# Patient Record
Sex: Male | Born: 1945 | Race: White | Hispanic: No | Marital: Married | Smoking: Former smoker
Health system: Southern US, Community
[De-identification: ages and names within clinical notes are randomized; demographics above are authoritative.]

## PROBLEM LIST (undated history)

## (undated) DIAGNOSIS — Q2112 Patent foramen ovale: Secondary | ICD-10-CM

## (undated) DIAGNOSIS — R002 Palpitations: Secondary | ICD-10-CM

## (undated) DIAGNOSIS — E039 Hypothyroidism, unspecified: Secondary | ICD-10-CM

## (undated) DIAGNOSIS — J439 Emphysema, unspecified: Secondary | ICD-10-CM

## (undated) DIAGNOSIS — G4730 Sleep apnea, unspecified: Secondary | ICD-10-CM

## (undated) DIAGNOSIS — I77810 Thoracic aortic ectasia: Secondary | ICD-10-CM

## (undated) DIAGNOSIS — H269 Unspecified cataract: Secondary | ICD-10-CM

## (undated) DIAGNOSIS — J45909 Unspecified asthma, uncomplicated: Secondary | ICD-10-CM

## (undated) DIAGNOSIS — I482 Chronic atrial fibrillation, unspecified: Secondary | ICD-10-CM

## (undated) DIAGNOSIS — I428 Other cardiomyopathies: Secondary | ICD-10-CM

## (undated) DIAGNOSIS — F410 Panic disorder [episodic paroxysmal anxiety] without agoraphobia: Secondary | ICD-10-CM

## (undated) DIAGNOSIS — K859 Acute pancreatitis without necrosis or infection, unspecified: Secondary | ICD-10-CM

## (undated) DIAGNOSIS — E785 Hyperlipidemia, unspecified: Secondary | ICD-10-CM

## (undated) DIAGNOSIS — K219 Gastro-esophageal reflux disease without esophagitis: Secondary | ICD-10-CM

## (undated) DIAGNOSIS — Q211 Atrial septal defect: Secondary | ICD-10-CM

## (undated) DIAGNOSIS — I5022 Chronic systolic (congestive) heart failure: Secondary | ICD-10-CM

## (undated) HISTORY — DX: Hyperlipidemia, unspecified: E78.5

## (undated) HISTORY — DX: Other cardiomyopathies: I42.8

## (undated) HISTORY — PX: TEE WITHOUT CARDIOVERSION: SHX5443

## (undated) HISTORY — DX: Chronic atrial fibrillation, unspecified: I48.20

## (undated) HISTORY — DX: Patent foramen ovale: Q21.12

## (undated) HISTORY — DX: Unspecified asthma, uncomplicated: J45.909

## (undated) HISTORY — DX: Thoracic aortic ectasia: I77.810

## (undated) HISTORY — PX: CYSTOSCOPY W/ URETEROSCOPY: SUR379

## (undated) HISTORY — DX: Unspecified cataract: H26.9

## (undated) HISTORY — DX: Acute pancreatitis without necrosis or infection, unspecified: K85.90

## (undated) HISTORY — DX: Atrial septal defect: Q21.1

## (undated) HISTORY — DX: Chronic systolic (congestive) heart failure: I50.22

---

## 1978-11-19 DIAGNOSIS — K859 Acute pancreatitis without necrosis or infection, unspecified: Secondary | ICD-10-CM

## 1978-11-19 HISTORY — DX: Acute pancreatitis without necrosis or infection, unspecified: K85.90

## 2005-03-15 ENCOUNTER — Emergency Department: Payer: Self-pay | Admitting: Internal Medicine

## 2012-07-20 HISTORY — PX: CARDIAC CATHETERIZATION: SHX172

## 2012-07-25 ENCOUNTER — Ambulatory Visit: Payer: Self-pay

## 2012-08-04 ENCOUNTER — Telehealth: Payer: Self-pay

## 2012-08-04 NOTE — Telephone Encounter (Signed)
Pt calls to say he was referred to Dr. Welton Flakes for echo by Dr. Alycia Rossetti for c/o palpitations. He has been taking clonopin and zoloft for 7 years and was told these medications could be contributing to palpitations. Echo was ordered for this reason.   Echo was performed and results were called to pt today by someone at Dr. Milta Deiters office. Pt says he was told this was "grossly abnormal" and he needed to be seen tomm. Pt asked for further clarification but they refused to give him any info.  He then asked for echo report to be faxed to [place] and he says they stated, "If you switch your care to them you can never come back".   He is very anxious about these results and would ike one of our MDs to review. We received results of echo and Dr. Kirke Corin reviewed but we cannot give pt any results since we did not order. He says pt will need to be seen in office by [place] to get results. Pt called back. I explained this to him.  He is scheduled with Dr. Kirke Corin tomm at 1015. Understanding verb.

## 2012-08-05 ENCOUNTER — Encounter: Payer: Self-pay | Admitting: Cardiovascular Disease

## 2012-08-05 ENCOUNTER — Ambulatory Visit (INDEPENDENT_AMBULATORY_CARE_PROVIDER_SITE_OTHER): Payer: PRIVATE HEALTH INSURANCE | Admitting: Cardiovascular Disease

## 2012-08-05 VITALS — BP 118/62 | HR 121 | Ht 72.0 in | Wt 165.8 lb

## 2012-08-05 DIAGNOSIS — R002 Palpitations: Secondary | ICD-10-CM

## 2012-08-05 DIAGNOSIS — I4891 Unspecified atrial fibrillation: Secondary | ICD-10-CM | POA: Insufficient documentation

## 2012-08-05 DIAGNOSIS — Q211 Atrial septal defect: Secondary | ICD-10-CM

## 2012-08-05 DIAGNOSIS — I5022 Chronic systolic (congestive) heart failure: Secondary | ICD-10-CM

## 2012-08-05 HISTORY — DX: Chronic systolic (congestive) heart failure: I50.22

## 2012-08-05 MED ORDER — METOPROLOL TARTRATE 25 MG PO TABS: 25.0000 mg | ORAL_TABLET | Freq: Two times a day (BID) | ORAL | Status: DC

## 2012-08-05 NOTE — Progress Notes (Signed)
HPI  This is a 66 year old gentleman who was referred by Dr. Beverely Risen for evaluation of irregular heartbeats and abnormal echocardiogram. The patient was not aware of any previous cardiac history. He has known history of anxiety and panic disorder for many years which has been reasonably controlled with medications. He also has history of hypothyroidism on replacement therapy. He is not aware of any history of hypertension, or diabetes. He was recently noted to have irregular heartbeats. He underwent an echocardiogram which showed severely reduced LV systolic function with an ejection fraction of 25-30% with anterior wall hypokinesis. Both atria were noted to be of normal size. 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 The patient has noticed increased palpitations recently but thought it was due to his known anxiety. He is noted to be in atrial fibrillation with rapid ventricular response today. He is not aware of this diagnosis before. He does complain of dyspnea with minimal activities but no chest pain. He denies orthopnea, PND or lower extremity edema.   No Known Allergies   Current Outpatient Prescriptions on File Prior to Visit  Medication Sig Dispense Refill  . buPROPion (WELLBUTRIN SR) 100 MG 12 hr tablet Take 100 mg by mouth daily.      . clonazePAM (KLONOPIN) 0.5 MG tablet Take 0.25 mg by mouth every morning. Take 1/2 tablet twice daily.      [name] levothyroxine (SYNTHROID, LEVOTHROID) 75 MCG tablet Take 75 mcg by mouth daily.      . sertraline (ZOLOFT) 100 MG tablet Take 100 mg by mouth daily.      . simvastatin (ZOCOR) 20 MG tablet Take 10 mg by mouth at bedtime.      . metoprolol tartrate (LOPRESSOR) 25 MG tablet Take 1 tablet (25 mg total) by mouth 2 (two) times daily.  60 tablet  6     Past Medical History  Diagnosis Date  . Asthma   . Hyperlipidemia   . Pancreatitis 1980    due to ETOH     Past Surgical History    Procedure Date  . Cystoscopy w/ ureteroscopy      Family History  Problem Relation Age of Onset  . Arrhythmia Sister     A-fib/stroke   . Hyperlipidemia Brother      History   Social History  . Marital Status: Married    Spouse Name: N/A    Number of Children: N/A  . Years of Education: N/A   Occupational History  . Not on file.   Social History Main Topics  . Smoking status: Former Smoker -- 0.5 packs/day for 20 years    Types: Cigarettes    Quit date: 08/07/2008  . Smokeless tobacco: Not on file  . Alcohol Use: No  . Drug Use: No  . Sexually Active: Not on file   Other Topics Concern  . Not on file   Social History Narrative  . No narrative on file     ROS Constitutional: Negative for fever, chills, diaphoresis, activity change, appetite change and fatigue.  HENT: Negative for hearing loss, nosebleeds, congestion, sore throat, facial swelling, drooling, trouble swallowing, neck pain, voice change, sinus pressure and tinnitus.  Eyes: Negative for photophobia, pain, discharge and visual disturbance.  Respiratory: Negative for apnea, cough, chest tightness and wheezing.  Cardiovascular: Negative for chest pain  and leg swelling.  Gastrointestinal: Negative for nausea, vomiting, abdominal pain, diarrhea, constipation, blood in stool  and abdominal distention.  Genitourinary: Negative for dysuria, urgency, frequency, hematuria and decreased urine volume.  Musculoskeletal: Negative for myalgias, back pain, joint swelling, arthralgias and gait problem.  Skin: Negative for color change, pallor, rash and wound.  Neurological: Negative for dizziness, tremors, seizures, syncope, speech difficulty, weakness, light-headedness, numbness and headaches.  Psychiatric/Behavioral: Negative for suicidal ideas, hallucinations, behavioral problems and agitation. The patient is  nervous/anxious.     PHYSICAL EXAM   BP 118/62  Pulse 121  Ht 6' (1.829 m)  Wt 165 lb 12 oz  (75.184 kg)  BMI 22.48 kg/m2  Constitutional: He is oriented to person, place, and time. He appears well-developed and well-nourished. No distress.  HENT: No nasal discharge.  Head: Normocephalic and atraumatic.  Eyes: Pupils are equal and round. Right eye exhibits no discharge. Left eye exhibits no discharge.  Neck: Normal range of motion. Neck supple. No JVD present. No thyromegaly present.  Cardiovascular: Tachycardic, irregular rhythm, normal heart sounds and. Exam reveals no gallop and no friction rub. No murmur heard.  Pulmonary/Chest: Effort normal and breath sounds normal. No stridor. No respiratory distress. He has no wheezes. He has no rales. He exhibits no tenderness.  Abdominal: Soft. Bowel sounds are normal. He exhibits no distension. There is no tenderness. There is no rebound and no guarding.  Musculoskeletal: Normal range of motion. He exhibits no edema and no tenderness.  Neurological: He is alert and oriented to person, place, and time. Coordination normal.  Skin: Skin is warm and dry. No rash noted. He is not diaphoretic. No erythema. No pallor.  Psychiatric: He has a normal mood and affect. His behavior is normal. Judgment and thought content normal.      EKG: Atrial fibrillation  -irregular conduction  -RSR(V1) -nondiagnostic.   Possible left ventricular hypertrophy on non-voltage basis.   -Nonspecific ST depression   +   Negative T-waves  -Seen with left ventricular hypertrophy (strain) or digitalis effect  consider  Anterolateral ischemia.    ASSESSMENT AND PLAN

## 2012-08-05 NOTE — Patient Instructions (Addendum)
Start Metoprolol 25 mg twice daily.   Your physician has requested that you have a TEE. During a TEE, sound waves are used to create images of your heart. It provides your doctor with information about the size and shape of your heart and how well your heart's chambers and valves are working. In this test, a transducer is attached to the end of a flexible tube that's guided down your throat and into your esophagus (the tube leading from you mouth to your stomach) to get a more detailed image of your heart. You are not awake for the procedure. Please see the instruction sheet given to you today. For further information please visit [email or web address]   Your physician has requested that you have a cardiac catheterization. Cardiac catheterization is used to diagnose and/or treat various heart conditions. Doctors may recommend this procedure for a number of different reasons. The most common reason is to evaluate chest pain. Chest pain can be a symptom of coronary artery disease (CAD), and cardiac catheterization can show whether plaque is narrowing or blocking your heart's arteries. This procedure is also used to evaluate the valves, as well as measure the blood flow and oxygen levels in different parts of your heart. For further information please visit [email or web address] Please follow instruction sheet, as given.

## 2012-08-05 NOTE — Assessment & Plan Note (Signed)
The patient is currently in atrial fibrillation with rapid ventricular response. The onset of this is not entirely clear that he has been complaining of palpitations over the last few months. Given his degree of LV systolic dysfunction, he will likely need anticoagulation. However, I will wait until after cardiac catheterization. For now, continue treatment with aspirin daily and add metoprolol 25 mg twice daily for rate control.

## 2012-08-05 NOTE — Assessment & Plan Note (Signed)
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 I recommend a transesophageal echocardiogram for further evaluation of this. This will be done before his cardiac catheterization.

## 2012-08-05 NOTE — Assessment & Plan Note (Signed)
Jeffery Shaw was found recently to have severely reduced LV systolic function with an ejection fraction of 25-30% with significant anterior wall hypokinesis suggestive of underlying coronary artery disease. The etiology of cardiomyopathy is still not clear but most likely ischemic in nature. The other possibility could be tachycardia induced cardiomyopathy or nonischemic. Given his significant tachycardia, I recommend treatment with metoprolol 25 mg twice daily. I recommend proceeding with a right and left cardiac catheterization and possible coronary intervention. Risks, benefits and alternatives were discussed with the patient.

## 2012-08-06 ENCOUNTER — Telehealth: Payer: Self-pay

## 2012-08-06 LAB — BASIC METABOLIC PANEL
BUN/Creatinine Ratio: 15 (ref 10–22)
BUN: 17 mg/dL (ref 8–27)
Creatinine, Ser: 1.1 mg/dL (ref 0.76–1.27)
GFR calc Af Amer: 82 mL/min/{1.73_m2} (ref >59)
GFR calc non Af Amer: 71 mL/min/{1.73_m2} (ref >59)

## 2012-08-06 LAB — CBC WITH DIFFERENTIAL
Basos: 1 % (ref 0–3)
Eos: 6 % — ABNORMAL HIGH (ref 0–5)
Hemoglobin: 16.7 g/dL (ref 12.6–17.7)
Immature Grans (Abs): 0 10*3/uL (ref 0.0–0.1)
Lymphocytes Absolute: 1.9 10*3/uL (ref 0.7–3.1)
MCH: 34.5 pg — ABNORMAL HIGH (ref 26.6–33.0)
MCV: 99 fL — ABNORMAL HIGH (ref 79–97)
Monocytes Absolute: 0.7 10*3/uL (ref 0.1–0.9)
RBC: 4.84 x10E6/uL (ref 4.14–5.80)

## 2012-08-06 NOTE — Telephone Encounter (Signed)
Pt calling to say he went to see Dr. Welton Flakes as scheduled after appt with [place] yesterday. He says Dr. Welton Flakes agrees with Dr. Jari Sportsman plan and pt is going to procede.   He did have a question...Dr. Welton Flakes has scheduled pt for a sleep study tomm night at 2100, night before TEE. He asks if he should keep this as scheduled or cancel until after our procedures. He also wanted to let us know he has a chronic fungal infection (candidiasis) and wants Dr. Kirke Corin to be aware of this. I reassured pt I would inform him of this as well as sleep study and call him back. Understanding verb.

## 2012-08-06 NOTE — Telephone Encounter (Signed)
Sleep study is fine as long as it does not interfere with our schedule.

## 2012-08-07 NOTE — Telephone Encounter (Signed)
Attempted to reach pt to inform Home # disconnected Attempted wife's cell #. Had to [facility]

## 2012-08-07 NOTE — Telephone Encounter (Signed)
Pt called back. Informed him ok to proceed with sleep study as long as it does not interfere with TEE. He verb. Understanding and will be finished with sleep study by 0600.

## 2012-08-08 ENCOUNTER — Ambulatory Visit: Payer: Self-pay | Admitting: Cardiovascular Disease

## 2012-08-08 DIAGNOSIS — Q211 Atrial septal defect: Secondary | ICD-10-CM

## 2012-08-12 ENCOUNTER — Ambulatory Visit: Payer: Self-pay | Admitting: Cardiovascular Disease

## 2012-08-12 ENCOUNTER — Encounter: Payer: Self-pay | Admitting: Cardiovascular Disease

## 2012-08-12 DIAGNOSIS — R0602 Shortness of breath: Secondary | ICD-10-CM

## 2012-08-21 ENCOUNTER — Other Ambulatory Visit: Payer: Self-pay | Admitting: Cardiovascular Disease

## 2012-08-21 DIAGNOSIS — R002 Palpitations: Secondary | ICD-10-CM

## 2012-08-22 ENCOUNTER — Encounter: Payer: Self-pay | Admitting: *Deleted

## 2012-08-22 ENCOUNTER — Ambulatory Visit (INDEPENDENT_AMBULATORY_CARE_PROVIDER_SITE_OTHER): Payer: PRIVATE HEALTH INSURANCE | Admitting: Cardiovascular Disease

## 2012-08-22 VITALS — BP 108/78 | HR 80 | Ht 72.0 in | Wt 161.8 lb

## 2012-08-22 DIAGNOSIS — I5022 Chronic systolic (congestive) heart failure: Secondary | ICD-10-CM

## 2012-08-22 DIAGNOSIS — Q211 Atrial septal defect: Secondary | ICD-10-CM

## 2012-08-22 DIAGNOSIS — I4891 Unspecified atrial fibrillation: Secondary | ICD-10-CM

## 2012-08-22 MED ORDER — RIVAROXABAN 20 MG PO TABS: 20.0000 mg | ORAL_TABLET | Freq: Every day | ORAL | Status: DC

## 2012-08-22 NOTE — Patient Instructions (Addendum)
Stop Aspirin.  Start Xarelto 20 mg once daily (blood thinner).  Schedule cardioversion in 3-4 weeks.  Continue other medications.

## 2012-08-22 NOTE — Assessment & Plan Note (Signed)
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 Cardiac catheterization showed no significant coronary artery disease. Ejection fraction was 35%. He appears to be euvolemic at this time. Continue treatment with metoprolol. I recommend trying to maintain sinus rhythm and reevaluate his ejection fraction. He will likely require treatment with an ACE inhibitor as well.

## 2012-08-22 NOTE — Assessment & Plan Note (Signed)
Due to his cardiomyopathy, I recommend an attempt at cardioversion after 3-4 weeks of full anticoagulation. I started him on Xarelto 20 mg once daily. If he fails cardioversion, the next step would be considering an antiarrhythmic medication such as amiodarone or catheter ablation.

## 2012-08-22 NOTE — Assessment & Plan Note (Signed)
The shunt ratio was a relatively small by cardiac catheterization. Thus, no further treatment is needed for this.

## 2012-08-22 NOTE — Progress Notes (Signed)
HPI  This is a 65 year old gentleman who is here today for a followup visit regarding recently diagnosed congestive heart failure, atrial fibrillation and a PFO. He was recently noted to have irregular heartbeats. He underwent an echocardiogram which showed severely reduced LV systolic function with an ejection fraction of 25-30% with anterior wall hypokinesis. Both atria were noted to be of normal size. 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  He was noted to be in atrial fibrillation with rapid ventricular response during his initial visit with me. I started him on metoprolol 25 mg twice daily. He underwent a transesophageal echocardiogram which showed an ejection fraction of 30-35% with normal atrial size. There was a tunneled PFO with a small to moderate left atrial shunting by color Doppler. He underwent a right and left cardiac catheterization which showed no significant coronary artery disease with a QP/QS shunt ratio of 1.37.  Overall, he feels well with only mild dyspnea. He was also recently diagnosed with sleep apnea which is being managed by Dr. Freda Munro.   No Known Allergies   Current Outpatient Prescriptions on File Prior to Visit  Medication Sig Dispense Refill  . buPROPion (WELLBUTRIN SR) 100 MG 12 hr tablet Take 100 mg by mouth daily.      . clonazePAM (KLONOPIN) 0.5 MG tablet Take 0.25 mg by mouth every morning. Take 1/2 tablet twice daily.      [name] levothyroxine (SYNTHROID, LEVOTHROID) 75 MCG tablet Take 75 mcg by mouth daily.      . metoprolol tartrate (LOPRESSOR) 25 MG tablet Take 1 tablet (25 mg total) by mouth 2 (two) times daily.  60 tablet  6  . Plant Sterols and Stanols (CHOLEST OFF PO) Take by mouth daily.      . sertraline (ZOLOFT) 100 MG tablet Take 100 mg by mouth daily.      . simvastatin (ZOCOR) 20 MG tablet Take 10 mg by mouth at bedtime.      [name] VITAMIN D, ERGOCALCIFEROL, PO Take 400 Units by mouth  daily.      . Rivaroxaban (XARELTO) 20 MG TABS Take 1 tablet (20 mg total) by mouth daily.  30 tablet  6     Past Medical History  Diagnosis Date  . Asthma   . Hyperlipidemia   . Pancreatitis 1980    due to ETOH  . Chronic systolic heart failure 08/05/2012  . 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 Chronic systolic heart failure     Due to nonischemic cardiomyopathy. Cardiac catheterization in September of 2013 showed no significant coronary artery disease with an ejection fraction of 35%. Normal filling pressures with only mild pulmonary hypertension.     Past Surgical History  Procedure Date  . Cystoscopy w/ ureteroscopy   . Cardiac catheterization 07/2012    [facility]  . Tee without cardioversion      Family History  Problem Relation Age of Onset  . Arrhythmia Sister     A-fib/stroke   . Hyperlipidemia Brother      History   Social History  . Marital Status: Married    Spouse Name: N/A    Number of Children: N/A  . Years of Education: N/A   Occupational History  . Not on file.   Social History Main Topics  .  Smoking status: Former Smoker -- 0.5 packs/day for 20 years    Types: Cigarettes    Quit date: 08/07/2008  . Smokeless tobacco: Not on file  . Alcohol Use: No  . Drug Use: No  . Sexually Active: Not on file   Other Topics Concern  . Not on file   Social History Narrative  . No narrative on file     PHYSICAL EXAM   BP 108/78  Pulse 80  Ht 6' (1.829 m)  Wt 161 lb 12 oz (73.369 kg)  BMI 21.94 kg/m2  Constitutional: He is oriented to person, place, and time. He appears well-developed and well-nourished. No distress.  HENT: No nasal discharge.  Head: Normocephalic and atraumatic.  Eyes: Pupils are equal and round. Right eye exhibits no discharge. Left eye exhibits no discharge.  Neck: Normal range of motion. Neck supple. No JVD present. No  thyromegaly present.  Cardiovascular:  normal rate , irregular rhythm, normal heart sounds and. Exam reveals no gallop and no friction rub. No murmur heard.  Pulmonary/Chest: Effort normal and breath sounds normal. No stridor. No respiratory distress. He has no wheezes. He has no rales. He exhibits no tenderness.  Abdominal: Soft. Bowel sounds are normal. He exhibits no distension. There is no tenderness. There is no rebound and no guarding.  Musculoskeletal: Normal range of motion. He exhibits no edema and no tenderness.  Neurological: He is alert and oriented to person, place, and time. Coordination normal.  Skin: Skin is warm and dry. No rash noted. He is not diaphoretic. No erythema. No pallor.  Psychiatric: He has a normal mood and affect. His behavior is normal. Judgment and thought content normal.   right groin: A small ecchymosis with no hematoma or bruit.     EKG: Atrial fibrillation   -RSR(V1) -nondiagnostic.   -Nonspecific ST depression   +   Negative T-waves  -Nondiagnostic  -Possible  Anterolateral  ischemia.   ABNORMAL     ASSESSMENT AND PLAN

## 2012-08-27 ENCOUNTER — Telehealth: Payer: Self-pay

## 2012-08-27 ENCOUNTER — Other Ambulatory Visit: Payer: Self-pay

## 2012-08-27 DIAGNOSIS — I4891 Unspecified atrial fibrillation: Secondary | ICD-10-CM

## 2012-08-27 NOTE — Telephone Encounter (Signed)
Schedule cardioversion

## 2012-08-27 NOTE — Telephone Encounter (Signed)
Pt wants cardioversion to be scheduled for 09/15/12 I told him I would call to arrange and call him back with details. Understanding verb

## 2012-08-27 NOTE — Telephone Encounter (Signed)
LMTCB Also mailing instructions re: cardioversion to pt

## 2012-09-01 ENCOUNTER — Telehealth: Payer: Self-pay

## 2012-09-01 NOTE — Telephone Encounter (Signed)
Pt calling to get specifics regarding upcoming cardioversion. Verbal instructions given to pt. Also scheduled him for an appt for labs/EKG 09/12/12 at 0930. He also asks about cost of procedure. i advised him to contact billing dept at [facility] to find out. Understanding verb.

## 2012-09-10 ENCOUNTER — Other Ambulatory Visit: Payer: Self-pay | Admitting: *Deleted

## 2012-09-10 DIAGNOSIS — Z01818 Encounter for other preprocedural examination: Secondary | ICD-10-CM

## 2012-09-10 DIAGNOSIS — Z01810 Encounter for preprocedural cardiovascular examination: Secondary | ICD-10-CM

## 2012-09-12 ENCOUNTER — Ambulatory Visit (INDEPENDENT_AMBULATORY_CARE_PROVIDER_SITE_OTHER): Payer: PRIVATE HEALTH INSURANCE

## 2012-09-12 ENCOUNTER — Other Ambulatory Visit: Payer: Self-pay | Admitting: Cardiovascular Disease

## 2012-09-12 ENCOUNTER — Ambulatory Visit (INDEPENDENT_AMBULATORY_CARE_PROVIDER_SITE_OTHER): Payer: PRIVATE HEALTH INSURANCE | Admitting: Cardiovascular Disease

## 2012-09-12 ENCOUNTER — Encounter: Payer: Self-pay | Admitting: Cardiovascular Disease

## 2012-09-12 ENCOUNTER — Other Ambulatory Visit: Payer: PRIVATE HEALTH INSURANCE

## 2012-09-12 VITALS — BP 124/72 | HR 80 | Ht 72.0 in | Wt 162.0 lb

## 2012-09-12 DIAGNOSIS — I4891 Unspecified atrial fibrillation: Secondary | ICD-10-CM

## 2012-09-12 DIAGNOSIS — R002 Palpitations: Secondary | ICD-10-CM

## 2012-09-12 DIAGNOSIS — Z01818 Encounter for other preprocedural examination: Secondary | ICD-10-CM

## 2012-09-12 DIAGNOSIS — Z01810 Encounter for preprocedural cardiovascular examination: Secondary | ICD-10-CM

## 2012-09-12 DIAGNOSIS — R0989 Other specified symptoms and signs involving the circulatory and respiratory systems: Secondary | ICD-10-CM

## 2012-09-12 NOTE — Patient Instructions (Addendum)
Patient will have a cardioversion on Monday 09/15/2012.

## 2012-09-15 ENCOUNTER — Ambulatory Visit: Payer: Self-pay | Admitting: Cardiovascular Disease

## 2012-09-15 DIAGNOSIS — I4891 Unspecified atrial fibrillation: Secondary | ICD-10-CM

## 2012-09-15 HISTORY — PX: CARDIOVERSION: SHX1299

## 2012-09-16 ENCOUNTER — Telehealth: Payer: Self-pay | Admitting: Cardiovascular Disease

## 2012-09-16 NOTE — Telephone Encounter (Signed)
LMTCB

## 2012-09-16 NOTE — Telephone Encounter (Signed)
Pt calling states that he had his cardioversion yesterday and he says he never felt fluttering before and last night he did. Wants to know what he should do.

## 2012-09-17 ENCOUNTER — Ambulatory Visit (INDEPENDENT_AMBULATORY_CARE_PROVIDER_SITE_OTHER): Payer: PRIVATE HEALTH INSURANCE

## 2012-09-17 VITALS — BP 98/60 | HR 100 | Ht 72.0 in | Wt 164.5 lb

## 2012-09-17 DIAGNOSIS — R002 Palpitations: Secondary | ICD-10-CM

## 2012-09-17 NOTE — Telephone Encounter (Signed)
Late entry: Pt called back at 0900 this am Says he has been feeling more palpitations than usual. Had cardioversion for atrial fib Monday 09/15/12 Was told he converted to NSR This am he is concerned b/'c he has "never felt this many skips" Otherwise asymptomatic Advised to come in for EKG this am at 1000

## 2012-09-17 NOTE — Progress Notes (Signed)
Pt here for EKG C/o frequent palpitations since cardioversion 2 days ago EKG performed Denies sob, CP, or dizziness  EKG shows questionable atrial fib versus NSR with PAC's Dr. Kirke Corin at [facility] today No MD in office at this time  I have paged Dr. Kirke Corin   Dr. Kirke Corin scrubbed in at [facility] lab I spoke with nurse in lab who explained situation to Dr. Kirke Corin "Have pt take extra metoprolol today only"  V.O Dr. Adline Mango, RN  Pt informed. Understanding verb. Has f/u appt with Dr. Kirke Corin 09/23/12  I called pt on cell phone after he left and advised him to monitor BP closely today. I explained it was a little low in office but he was asymptomatic. I warned him that the extra metoprolol dose may cause his BP to drop slightly but advised him to call us should he become symptomatic/SBP<90. I also advised him to check BP tonight, prior to next metoprolol dose, and may only need to take 1/2 metoprolol if BP still low. Understanding verb.

## 2012-09-17 NOTE — Patient Instructions (Addendum)
Take an extra dose of metoprolol today only Keep appt with Dr. Kirke Corin next week as scheduled Call our office should symptoms worsen/continue despite med changes Monitor BP

## 2012-09-17 NOTE — Progress Notes (Signed)
   Patient ID: Jeffery Shaw, male    DOB: December 07, 1945, 66 y.o.   MRN: [id number]  HPI Comments: Patient will have a cardioversion on Monday 09/15/2012.     Review of Systems    Physical Exam

## 2012-09-18 ENCOUNTER — Telehealth: Payer: Self-pay

## 2012-09-18 NOTE — Telephone Encounter (Signed)
Dr. Kirke Corin reviewed EKG Agrees this is NSR with PAC's and gave order: "Have patient increase metoprolol to 50 mg BID and keep f/u appt as scheduled". V.O Dr. Adline Mango, RN  Pt informed. He says he feels well today and thinks the estra dose yesterday helped him feel better. He wants to try increasing to 1 1/2 tabs BID first then may increase to 2 tabs BID. He will keep appt with [place] Tuesday 09/23/12

## 2012-09-23 ENCOUNTER — Ambulatory Visit (INDEPENDENT_AMBULATORY_CARE_PROVIDER_SITE_OTHER): Payer: PRIVATE HEALTH INSURANCE | Admitting: Cardiovascular Disease

## 2012-09-23 ENCOUNTER — Encounter: Payer: Self-pay | Admitting: Cardiovascular Disease

## 2012-09-23 VITALS — BP 102/66 | HR 74 | Ht 72.0 in | Wt 166.5 lb

## 2012-09-23 DIAGNOSIS — I4891 Unspecified atrial fibrillation: Secondary | ICD-10-CM

## 2012-09-23 DIAGNOSIS — I5022 Chronic systolic (congestive) heart failure: Secondary | ICD-10-CM

## 2012-09-23 MED ORDER — LISINOPRIL 2.5 MG PO TABS: 2.5000 mg | ORAL_TABLET | Freq: Every day | ORAL | Status: DC

## 2012-09-23 NOTE — Progress Notes (Signed)
HPI  This is a 66 year old gentleman who is here today for a followup visit regarding congestive heart failure, atrial fibrillation and a PFO. Echocardiogram  showed severely reduced LV systolic function with an ejection fraction of 25-30% with anterior wall hypokinesis. Both atria were noted to be of normal size. 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  He was noted to be in atrial fibrillation with rapid ventricular response during his initial visit with me. I started him on metoprolol . He underwent a transesophageal echocardiogram which showed an ejection fraction of 30-35% with normal atrial size. There was a tunneled PFO with a small to moderate left atrial shunting by color Doppler. He underwent a right and left cardiac catheterization which showed no significant coronary artery disease with a QP/QS shunt ratio of 1.37.  He recently underwent cardioversion. He converted to normal sinus rhythm but was noted to have frequent PACs. After the procedure, he complained of increased palpitations. An ECG was done which showed sinus rhythm with frequent PACs. Today, his EKG showed that he is back in atrial fibrillation. Overall, he feels well. He denies chest pain,  dyspnea or palpitations.  No Known Allergies   Current Outpatient Prescriptions on File Prior to Visit  Medication Sig Dispense Refill  . buPROPion (WELLBUTRIN SR) 100 MG 12 hr tablet Take 100 mg by mouth daily.      . clonazePAM (KLONOPIN) 0.5 MG tablet Take 0.25 mg by mouth every morning. Take 1/2 tablet twice daily.      [name] levothyroxine (SYNTHROID, LEVOTHROID) 75 MCG tablet Take 75 mcg by mouth daily.      . Plant Sterols and Stanols (CHOLEST OFF PO) Take by mouth daily.      . Rivaroxaban (XARELTO) 20 MG TABS Take 1 tablet (20 mg total) by mouth daily.  30 tablet  6  . sertraline (ZOLOFT) 100 MG tablet Take 100 mg by mouth daily.      . simvastatin (ZOCOR) 20 MG tablet  Take 10 mg by mouth at bedtime.      [name] VITAMIN D, ERGOCALCIFEROL, PO Take 400 Units by mouth daily.      . [DISCONTINUED] metoprolol tartrate (LOPRESSOR) 25 MG tablet Take 1 tablet (25 mg total) by mouth 2 (two) times daily.  60 tablet  6  . lisinopril (PRINIVIL,ZESTRIL) 2.5 MG tablet Take 1 tablet (2.5 mg total) by mouth daily.  60 tablet  6     Past Medical History  Diagnosis Date  . Asthma   . Hyperlipidemia   . Pancreatitis 1980    due to ETOH  . Chronic systolic heart failure 08/05/2012  . 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 Chronic systolic heart failure     Due to nonischemic cardiomyopathy. Cardiac catheterization in September of 2013 showed no significant coronary artery disease with an ejection fraction of 35%. Normal filling pressures with only mild pulmonary hypertension.     Past Surgical History  Procedure Date  . Cystoscopy w/ ureteroscopy   . Cardiac catheterization 07/2012    [facility]  . Tee without cardioversion   . Cardioversion 09/15/12     Family History  Problem Relation Age of Onset  . Arrhythmia Sister     A-fib/stroke   . Hyperlipidemia Brother      History  Social History  . Marital Status: Married    Spouse Name: N/A    Number of Children: N/A  . Years of Education: N/A   Occupational History  . Not on file.   Social History Main Topics  . Smoking status: Former Smoker -- 0.5 packs/day for 20 years    Types: Cigarettes    Quit date: 08/07/2008  . Smokeless tobacco: Not on file  . Alcohol Use: No  . Drug Use: No  . Sexually Active: Not on file   Other Topics Concern  . Not on file   Social History Narrative  . No narrative on file     PHYSICAL EXAM   BP 102/66  Pulse 74  Ht 6' (1.829 m)  Wt 166 lb 8 oz (75.524 kg)  BMI 22.58 kg/m2  Constitutional: He is oriented to person, place, and time. He appears well-developed  and well-nourished. No distress.  HENT: No nasal discharge.  Head: Normocephalic and atraumatic.  Eyes: Pupils are equal and round. Right eye exhibits no discharge. Left eye exhibits no discharge.  Neck: Normal range of motion. Neck supple. No JVD present. No thyromegaly present.  Cardiovascular:  normal rate , irregular rhythm, normal heart sounds and. Exam reveals no gallop and no friction rub. No murmur heard.  Pulmonary/Chest: Effort normal and breath sounds normal. No stridor. No respiratory distress. He has no wheezes. He has no rales. He exhibits no tenderness.  Abdominal: Soft. Bowel sounds are normal. He exhibits no distension. There is no tenderness. There is no rebound and no guarding.  Musculoskeletal: Normal range of motion. He exhibits no edema and no tenderness.  Neurological: He is alert and oriented to person, place, and time. Coordination normal.  Skin: Skin is warm and dry. No rash noted. He is not diaphoretic. No erythema. No pallor.  Psychiatric: He has a normal mood and affect. His behavior is normal. Judgment and thought content normal.   right groin: A small ecchymosis with no hematoma or bruit.     EKG: Atrial fibrillation  -  -Nonspecific ST depression   +   Negative T-waves  -Seen with left ventricular hypertrophy (strain) or digitalis effect.   ABNORMAL    ASSESSMENT AND PLAN

## 2012-09-23 NOTE — Assessment & Plan Note (Signed)
The patient is back in atrial fibrillation after recent cardioversion. He does not seem to be symptomatic at all. I discussed different management options moving forward. These include placing him on antiarrhythmic medication either amiodarone or dofetilide then attempting cardioversion again. The other option is to continue medical therapy for now given that he does not seem to be particularly symptomatic. We can continue with rate control and reevaluate his ejection fraction in 2 months. If there is no significant improvement, we might need to consider attempting cardioversion again after loading him with an antiarrhythmic medication. Continue anticoagulation. He is still working on getting his CPAP adjusted to treat his sleep apnea.

## 2012-09-23 NOTE — Patient Instructions (Addendum)
Start Lisinopril 2.5 mg once daily.  Continue other medications.  Schedule echocardiogram in 2 months.  Follow up after Echo.

## 2012-09-23 NOTE — Assessment & Plan Note (Signed)
I will add very small dose lisinopril today. Continue current dose of metoprolol. He does not appear to be fluid overloaded.

## 2012-10-06 ENCOUNTER — Telehealth: Payer: Self-pay

## 2012-10-06 NOTE — Telephone Encounter (Signed)
Pt calling to say he has noticed some feelings of "lightheadedness" while working in yard over the past 1 week He wonders if this is assoc. With lisinopril start I had him hold while I discussed with Dr. Kirke Corin  "Have pt monitor BP over the next few days and call us with results". VO Dr. Adline Mango, RN Pt informed Understanding verb

## 2012-10-22 ENCOUNTER — Other Ambulatory Visit: Payer: Self-pay

## 2012-10-22 MED ORDER — METOPROLOL TARTRATE 50 MG PO TABS: 50.0000 mg | ORAL_TABLET | Freq: Two times a day (BID) | ORAL | Status: DC

## 2012-11-14 ENCOUNTER — Telehealth: Payer: Self-pay | Admitting: Cardiovascular Disease

## 2012-11-14 NOTE — Telephone Encounter (Signed)
Pt states he is in Metoprolol and Lisinopril and is having a dry cough or "tickling of throat".  Pt would like to discuss his options.

## 2012-11-14 NOTE — Telephone Encounter (Signed)
Do you want me to advise pt to hold lisinopril for a few days?

## 2012-11-15 NOTE — Telephone Encounter (Signed)
It is likely a side effect of Lisinopril. Stop Lisinopril. Start Losartan 25 mg (1/2 tablet once daily)

## 2012-11-17 ENCOUNTER — Other Ambulatory Visit: Payer: Self-pay

## 2012-11-17 MED ORDER — LOSARTAN POTASSIUM 25 MG PO TABS: 12.5000 mg | ORAL_TABLET | Freq: Every day | ORAL | Status: DC

## 2012-11-17 NOTE — Telephone Encounter (Signed)
Pt informed Understanding verb New RX sent to pharm  

## 2012-11-25 ENCOUNTER — Other Ambulatory Visit: Payer: Self-pay

## 2012-11-25 ENCOUNTER — Other Ambulatory Visit (INDEPENDENT_AMBULATORY_CARE_PROVIDER_SITE_OTHER): Payer: PRIVATE HEALTH INSURANCE

## 2012-11-25 DIAGNOSIS — Q211 Atrial septal defect: Secondary | ICD-10-CM

## 2012-11-25 DIAGNOSIS — I4891 Unspecified atrial fibrillation: Secondary | ICD-10-CM

## 2012-11-26 ENCOUNTER — Telehealth: Payer: Self-pay

## 2012-11-26 NOTE — Telephone Encounter (Signed)
Pt calling for echo results I explained Dr. Kirke Corin to review and we will call him with results Understanding verb

## 2012-11-26 NOTE — Telephone Encounter (Signed)
Pt not at home. 

## 2012-11-27 ENCOUNTER — Encounter: Payer: Self-pay | Admitting: Cardiovascular Disease

## 2012-12-01 ENCOUNTER — Ambulatory Visit (INDEPENDENT_AMBULATORY_CARE_PROVIDER_SITE_OTHER): Payer: PRIVATE HEALTH INSURANCE | Admitting: Cardiovascular Disease

## 2012-12-01 ENCOUNTER — Telehealth: Payer: Self-pay | Admitting: *Deleted

## 2012-12-01 ENCOUNTER — Encounter: Payer: Self-pay | Admitting: Cardiovascular Disease

## 2012-12-01 VITALS — BP 100/70 | HR 89 | Ht 72.0 in | Wt 167.5 lb

## 2012-12-01 DIAGNOSIS — I4891 Unspecified atrial fibrillation: Secondary | ICD-10-CM

## 2012-12-01 DIAGNOSIS — I5022 Chronic systolic (congestive) heart failure: Secondary | ICD-10-CM

## 2012-12-01 MED ORDER — AMIODARONE HCL 200 MG PO TABS: 200.0000 mg | ORAL_TABLET | Freq: Two times a day (BID) | ORAL | Status: DC

## 2012-12-01 NOTE — Telephone Encounter (Signed)
Patient stated he came in the office this morning and spoke to her about a medication.  He would like to discuss this medication further, please call at [phone].

## 2012-12-01 NOTE — Patient Instructions (Addendum)
Start Amiodarone 200 mg twice daily.  Schedule cardioversion in 2 weeks.

## 2012-12-01 NOTE — Telephone Encounter (Signed)
Pt informed, per Dr. Kirke Corin, we are adding amiodarone to metoprolol. Advised him to continue metoprolol as prescribed. Pt informed Understanding verb He also asks if there are any vitamins that may be contraindicated while taking amiodarone I told him I would have to ask Dr. Kirke Corin and call him back Understanding verb

## 2012-12-01 NOTE — Assessment & Plan Note (Signed)
Jeffery Shaw continues to be in atrial fibrillation. He continues to have episodes of palpitations likely due to rapid ventricular response. This is in spite of maximal dose metoprolol. I cannot at another rate controlling medication due to low blood pressure. I am concerned that his ejection fraction is still low at -30-35%. His cardiomyopathy was felt to be tachycardia induced. Thus, I think it's extremely important to control his atrial fibrillation. During his initial cardioversion, he only stayed in sinus rhythm for a few days. Because of that, I believe an antiarrhythmic medication is indicated. Due to significantly reduced LV systolic function, the options include amiodarone or dofetilide. I will start him today on amiodarone 200 mg twice daily and plan cardioversion in 2 weeks. My plan is to use amiodarone short-term for 6-12 months to maintain sinus rhythm and allow improvement in LV systolic function. He is already anticoagulated with Xarelto and has not missed any doses.

## 2012-12-01 NOTE — Progress Notes (Signed)
HPI  This is a 67 year old gentleman who is here today for a followup visit regarding congestive heart failure, atrial fibrillation and a PFO. Initial outside Echocardiogram few months ago showed severely reduced LV systolic function with an ejection fraction of 25-30% with anterior wall hypokinesis. Both atria were noted to be of normal size. 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  He was noted to be in atrial fibrillation with rapid ventricular response during his initial visit with me. I started him on metoprolol for rate control. He underwent a transesophageal echocardiogram which showed an ejection fraction of 30-35% with normal atrial size. There was a tunneled PFO with a small to moderate left atrial shunting by color Doppler. He underwent a right and left cardiac catheterization which showed no significant coronary artery disease with a QP/QS shunt ratio of 1.37.  He  underwent cardioversion to sinus rhythm but went back into atrial fibrillation after a few days. He is currently on maximum dose metoprolol 100 mg once daily with borderline low blood pressure. He continues to have frequent episodes of palpitations especially when he consumes a high carbohydrate meal. His dyspnea is improved.   No Known Allergies   Current Outpatient Prescriptions on File Prior to Visit  Medication Sig Dispense Refill  . buPROPion (WELLBUTRIN SR) 100 MG 12 hr tablet Take 100 mg by mouth daily.      . clonazePAM (KLONOPIN) 0.5 MG tablet Take 0.25 mg by mouth every morning. Take 1/2 tablet twice daily.      [name] levothyroxine (SYNTHROID, LEVOTHROID) 75 MCG tablet Take 75 mcg by mouth daily.      [name] losartan (COZAAR) 25 MG tablet Take 0.5 tablets (12.5 mg total) by mouth daily.  90 tablet  3  . Plant Sterols and Stanols (CHOLEST OFF PO) Take by mouth daily.      . Rivaroxaban (XARELTO) 20 MG TABS Take 1 tablet (20 mg total) by mouth daily.  30 tablet  6    . sertraline (ZOLOFT) 100 MG tablet Take 100 mg by mouth daily.      . simvastatin (ZOCOR) 20 MG tablet Take 10 mg by mouth at bedtime.      [name] VITAMIN D, ERGOCALCIFEROL, PO Take 400 Units by mouth daily.      [name] amiodarone (PACERONE) 200 MG tablet Take 1 tablet (200 mg total) by mouth 2 (two) times daily.  60 tablet  6     Past Medical History  Diagnosis Date  . Asthma   . Hyperlipidemia   . Pancreatitis 1980    due to ETOH  . Chronic systolic heart failure 08/05/2012  . 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 Chronic systolic heart failure     Due to nonischemic cardiomyopathy. Cardiac catheterization in September of 2013 showed no significant coronary artery disease with an ejection fraction of 35%. Normal filling pressures with only mild pulmonary hypertension.     Past Surgical History  Procedure Date  . Cystoscopy w/ ureteroscopy   . Cardiac catheterization 07/2012    [facility]  . Tee without cardioversion   . Cardioversion 09/15/12     Family History  Problem Relation Age of Onset  . Arrhythmia Sister     A-fib/stroke   . Hyperlipidemia Brother      History  Social History  . Marital Status: Married    Spouse Name: N/A    Number of Children: N/A  . Years of Education: N/A   Occupational History  . Not on file.   Social History Main Topics  . Smoking status: Former Smoker -- 0.5 packs/day for 20 years    Types: Cigarettes    Quit date: 08/07/2008  . Smokeless tobacco: Not on file  . Alcohol Use: No  . Drug Use: No  . Sexually Active: Not on file   Other Topics Concern  . Not on file   Social History Narrative  . No narrative on file     PHYSICAL EXAM   BP 100/70  Pulse 89  Ht 6' (1.829 m)  Wt 167 lb 8 oz (75.978 kg)  BMI 22.72 kg/m2  Constitutional: He is oriented to person, place, and time. He appears well-developed and well-nourished. No  distress.  HENT: No nasal discharge.  Head: Normocephalic and atraumatic.  Eyes: Pupils are equal and round. Right eye exhibits no discharge. Left eye exhibits no discharge.  Neck: Normal range of motion. Neck supple. No JVD present. No thyromegaly present.  Cardiovascular:  normal rate , irregular rhythm, normal heart sounds and. Exam reveals no gallop and no friction rub. No murmur heard.  Pulmonary/Chest: Effort normal and breath sounds normal. No stridor. No respiratory distress. He has no wheezes. He has no rales. He exhibits no tenderness.  Abdominal: Soft. Bowel sounds are normal. He exhibits no distension. There is no tenderness. There is no rebound and no guarding.  Musculoskeletal: Normal range of motion. He exhibits no edema and no tenderness.  Neurological: He is alert and oriented to person, place, and time. Coordination normal.  Skin: Skin is warm and dry. No rash noted. He is not diaphoretic. No erythema. No pallor.  Psychiatric: He has a normal mood and affect. His behavior is normal. Judgment and thought content normal.   right groin: A small ecchymosis with no hematoma or bruit.     EKG: Atrial fibrillation  - -RSR(V1) -nondiagnostic.   ABNORMAL    ASSESSMENT AND PLAN

## 2012-12-01 NOTE — Assessment & Plan Note (Signed)
Recent echocardiogram showed that his ejection fraction is still 30-35%. Once able to maintain him in sinus rhythm, I will consider switching metoprolol to carvedilol and add spironolactone.

## 2012-12-02 LAB — PROTIME-INR
INR: 1.1 (ref 0.8–1.2)
Prothrombin Time: 11.8 s (ref 9.1–12.0)

## 2012-12-02 LAB — BASIC METABOLIC PANEL
BUN/Creatinine Ratio: 15 (ref 10–22)
CO2: 27 mmol/L (ref 19–28)
Chloride: 102 mmol/L (ref 97–108)
GFR calc Af Amer: 90 mL/min/{1.73_m2} (ref >59)
Sodium: 140 mmol/L (ref 134–144)

## 2012-12-02 LAB — CBC WITH DIFFERENTIAL
Basophils Absolute: 0 10*3/uL (ref 0.0–0.2)
Eosinophils Absolute: 0.1 10*3/uL (ref 0.0–0.4)
Immature Grans (Abs): 0 10*3/uL (ref 0.0–0.1)
Immature Granulocytes: 0 % (ref 0–2)
Lymphs: 30 % (ref 14–46)
MCHC: 34.4 g/dL (ref 31.5–35.7)
Monocytes: 13 % — ABNORMAL HIGH (ref 4–12)
Neutrophils Relative %: 56 % (ref 40–74)
RDW: 13.6 % (ref 12.3–15.4)
WBC: 8.2 10*3/uL (ref 3.4–10.8)

## 2012-12-08 ENCOUNTER — Encounter: Payer: Self-pay | Admitting: Cardiovascular Disease

## 2012-12-09 ENCOUNTER — Telehealth: Payer: Self-pay

## 2012-12-09 ENCOUNTER — Encounter: Payer: Self-pay | Admitting: Cardiovascular Disease

## 2012-12-09 NOTE — Telephone Encounter (Signed)
I called pt to get further info about echo and what he was asking. He asks why the echo performed at Dr. Milta Deiters office did not show atrial fib.  I explained an echocardiogram is not used to measure a heart rhythm and unless they did an EKG, they would not know if pt was in atrial fib the day of echo.  He verb. Understanding He asks when we prescribed metoprolol and when he showed to be in atrial fib at our office I told him he was found to be in atrial fib the first time we met him 08/05/12. I explained a medication  Does not "cause" atrial fib. This is either genetic or caused by other risk factors, but we did not "cause" his atrial fib Pt also wanted [place] to know he held metoprolol last night and heart "felt better" and wonders if the amiodarone and metoprolol are interacting. i reassured him these medications are ok to take together. I also explained the metoprolol is not only for rate control but also for optimizing his heart function. I warned him to not stop this completely. He asks if Dr. Kirke Corin would suggest decreasing dose at all. I told him I would ask. He also wants to "put cardioversion off another week" due to medical bills. i told him I would let Dr. Kirke Corin know but also warned him we are not cardioverting him "for the fun of it" but we are trying to control his atrial fib and possibly convert him to NSR to help preserve his heart function since it is still low. He verb understanding and asks that I get Dr. Jari Sportsman opinion.

## 2012-12-09 NOTE — Telephone Encounter (Signed)
If his heart rate is less than 70 bpm then we can decrease Metoprolol to 75 mg bid.  I prefer to do the cardioversion as scheduled but if he wants to wait an extra week, that's fine with me.

## 2012-12-09 NOTE — Telephone Encounter (Signed)
Pt informed He thinks HR=80 BPM i advised him to continue 100 mg BID He says he is taking "200 mg twice daily" I had him look at RX bottle He realized he has only been taking 50 mg BID of metoprolol I advised to increase to 75 mg BID since Hr still elevated Understanding verb  He says he will let us know about cardioversion by end of week

## 2012-12-12 ENCOUNTER — Telehealth: Payer: Self-pay | Admitting: *Deleted

## 2012-12-12 NOTE — Telephone Encounter (Signed)
Pt emailed me as well See this documentation

## 2012-12-12 NOTE — Telephone Encounter (Signed)
Pt calling states that he received his labs on my chart but hasnt heard from [place] about results

## 2012-12-24 ENCOUNTER — Ambulatory Visit (INDEPENDENT_AMBULATORY_CARE_PROVIDER_SITE_OTHER): Payer: PRIVATE HEALTH INSURANCE

## 2012-12-24 VITALS — BP 110/60 | HR 77 | Ht 72.0 in | Wt 167.0 lb

## 2012-12-24 DIAGNOSIS — I4891 Unspecified atrial fibrillation: Secondary | ICD-10-CM

## 2012-12-24 NOTE — Patient Instructions (Addendum)
Will proceed with cardioversion as scheduled for 2/6

## 2012-12-25 ENCOUNTER — Ambulatory Visit: Payer: Self-pay | Admitting: Cardiovascular Disease

## 2012-12-25 ENCOUNTER — Encounter: Payer: Self-pay | Admitting: Cardiovascular Disease

## 2012-12-25 DIAGNOSIS — I4891 Unspecified atrial fibrillation: Secondary | ICD-10-CM

## 2012-12-26 ENCOUNTER — Ambulatory Visit (INDEPENDENT_AMBULATORY_CARE_PROVIDER_SITE_OTHER): Payer: PRIVATE HEALTH INSURANCE

## 2012-12-26 VITALS — BP 110/60 | HR 63 | Ht 72.0 in | Wt 167.0 lb

## 2012-12-26 DIAGNOSIS — I4891 Unspecified atrial fibrillation: Secondary | ICD-10-CM

## 2012-12-26 NOTE — Progress Notes (Signed)
Pt wanted to come in for EKG. Had a cardioversion yesterday and felt he was back in atrial fib EKG performed Shows NSR with PACs, HR=63 BPM Reviewed with Dr. Kirke Corin who suggested pt remain on meds as prescribed and keep f/u appt as scheduled Pt verb understanding

## 2013-01-03 ENCOUNTER — Other Ambulatory Visit: Payer: Self-pay

## 2013-01-05 ENCOUNTER — Encounter: Payer: Self-pay | Admitting: Cardiovascular Disease

## 2013-01-05 ENCOUNTER — Ambulatory Visit (INDEPENDENT_AMBULATORY_CARE_PROVIDER_SITE_OTHER): Payer: PRIVATE HEALTH INSURANCE | Admitting: Cardiovascular Disease

## 2013-01-05 VITALS — BP 110/70 | HR 56 | Ht 71.0 in | Wt 169.5 lb

## 2013-01-05 DIAGNOSIS — I5022 Chronic systolic (congestive) heart failure: Secondary | ICD-10-CM

## 2013-01-05 DIAGNOSIS — I4891 Unspecified atrial fibrillation: Secondary | ICD-10-CM

## 2013-01-05 MED ORDER — METOPROLOL TARTRATE 100 MG PO TABS: 100.0000 mg | ORAL_TABLET | Freq: Two times a day (BID) | ORAL | Status: DC

## 2013-01-05 NOTE — Patient Instructions (Addendum)
Stop Amiodarone.  Continue other medications.  Follow up in 3 months.

## 2013-01-05 NOTE — Progress Notes (Signed)
HPI  This is a 67 year old gentleman who is here today for a followup visit regarding congestive heart failure, atrial fibrillation and a PFO. Initial outside Echocardiogram few months ago showed severely reduced LV systolic function with an ejection fraction of 25-30% with anterior wall hypokinesis. Both atria were noted to be of normal size. 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  He was noted to be in atrial fibrillation with rapid ventricular response during his initial visit with me. I started him on metoprolol for rate control. He underwent a transesophageal echocardiogram which showed an ejection fraction of 30-35% with normal atrial size. There was a tunneled PFO with a small to moderate left atrial shunting by color Doppler. He underwent a right and left cardiac catheterization which showed no significant coronary artery disease with a QP/QS shunt ratio of 1.37.  He  underwent cardioversion to sinus rhythm but went back into atrial fibrillation after a few days. Thus, I placed him on amiodarone and proceeded with cardioversion after a few weeks. He converted to sinus rhythm after 3 shocks. He came back last week for an EKG and he was still in sinus rhythm. He is now back into atrial fibrillation. However, it seems that he is really not seeing any difference whether he is in A. fib or sinus rhythm. He didn't know when he went back into A. fib. He feels well and denies chest pain or dyspnea.  No Known Allergies   Current Outpatient Prescriptions on File Prior to Visit  Medication Sig Dispense Refill  . buPROPion (WELLBUTRIN SR) 100 MG 12 hr tablet Take 100 mg by mouth daily.      . clonazePAM (KLONOPIN) 0.5 MG tablet Take 0.25 mg by mouth every morning. Take 1/2 tablet twice daily.      [name] levothyroxine (SYNTHROID, LEVOTHROID) 75 MCG tablet Take 75 mcg by mouth daily.      [name] losartan (COZAAR) 25 MG tablet Take 0.5 tablets (12.5 mg  total) by mouth daily.  90 tablet  3  . Plant Sterols and Stanols (CHOLEST OFF PO) Take by mouth daily.      . Rivaroxaban (XARELTO) 20 MG TABS Take 1 tablet (20 mg total) by mouth daily.  30 tablet  6  . sertraline (ZOLOFT) 100 MG tablet Take 100 mg by mouth daily.      . simvastatin (ZOCOR) 20 MG tablet Take 10 mg by mouth at bedtime.      [name] VITAMIN D, ERGOCALCIFEROL, PO Take 400 Units by mouth daily.       No current facility-administered medications on file prior to visit.     Past Medical History  Diagnosis Date  . Asthma   . Hyperlipidemia   . Pancreatitis 1980    due to ETOH  . Chronic systolic heart failure 08/05/2012  . 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 Chronic systolic heart failure     Due to nonischemic cardiomyopathy. Cardiac catheterization in September of 2013 showed no significant coronary artery disease with an ejection fraction of 35%. Normal filling pressures with only mild pulmonary hypertension.     Past Surgical History  Procedure Laterality Date  . Cystoscopy w/ ureteroscopy    . Cardiac catheterization  07/2012    [facility]  . Tee without cardioversion    .  Cardioversion  09/15/12     Family History  Problem Relation Age of Onset  . Arrhythmia Sister     A-fib/stroke   . Hyperlipidemia Brother      History   Social History  . Marital Status: Married    Spouse Name: N/A    Number of Children: N/A  . Years of Education: N/A   Occupational History  . Not on file.   Social History Main Topics  . Smoking status: Former Smoker -- 0.50 packs/day for 20 years    Types: Cigarettes    Quit date: 08/07/2008  . Smokeless tobacco: Not on file  . Alcohol Use: No  . Drug Use: No  . Sexually Active: Not on file   Other Topics Concern  . Not on file   Social History Narrative  . No narrative on file     PHYSICAL EXAM   BP 110/70   Pulse 56  Ht 5\' 11"  (1.803 m)  Wt 169 lb 8 oz (76.885 kg)  BMI 23.65 kg/m2  Constitutional: He is oriented to person, place, and time. He appears well-developed and well-nourished. No distress.  HENT: No nasal discharge.  Head: Normocephalic and atraumatic.  Eyes: Pupils are equal and round. Right eye exhibits no discharge. Left eye exhibits no discharge.  Neck: Normal range of motion. Neck supple. No JVD present. No thyromegaly present.  Cardiovascular:  normal rate , irregular rhythm, normal heart sounds and. Exam reveals no gallop and no friction rub. No murmur heard.  Pulmonary/Chest: Effort normal and breath sounds normal. No stridor. No respiratory distress. He has no wheezes. He has no rales. He exhibits no tenderness.  Abdominal: Soft. Bowel sounds are normal. He exhibits no distension. There is no tenderness. There is no rebound and no guarding.  Musculoskeletal: Normal range of motion. He exhibits no edema and no tenderness.  Neurological: He is alert and oriented to person, place, and time. Coordination normal.  Skin: Skin is warm and dry. No rash noted. He is not diaphoretic. No erythema. No pallor.  Psychiatric: He has a normal mood and affect. His behavior is normal. Judgment and thought content normal.   right groin: A small ecchymosis with no hematoma or bruit.     EKG: Atrial fibrillation  - -RSR(V1) -nondiagnostic.   ABNORMAL    ASSESSMENT AND PLAN

## 2013-01-05 NOTE — Assessment & Plan Note (Signed)
He is back in atrial fibrillation in spite of being on amiodarone. Given that he is really not noticing any difference when he is in sinus versus atrial fibrillation, it might be just reasonable to treat him with rate control. Thus, I will stop amiodarone and continue metoprolol 100 mg twice daily. Continue anticoagulation.

## 2013-01-05 NOTE — Assessment & Plan Note (Signed)
He appears to be euvolemic and currently [place] Heart Association class II. Continue medical therapy.

## 2013-01-14 NOTE — Telephone Encounter (Signed)
Pt seen in office by Dr Kirke Corin aware of results.

## 2013-02-25 ENCOUNTER — Encounter: Payer: Self-pay | Admitting: Cardiovascular Disease

## 2013-02-25 ENCOUNTER — Telehealth: Payer: Self-pay

## 2013-02-25 NOTE — Telephone Encounter (Signed)
Pt called and asks if Xarelto causes his face to be flushed? Please advise

## 2013-02-26 ENCOUNTER — Telehealth: Payer: Self-pay

## 2013-02-26 NOTE — Telephone Encounter (Signed)
Error

## 2013-02-26 NOTE — Telephone Encounter (Signed)
Please review patient's medications to see what is causing his face to be flushed. Please advise.

## 2013-02-26 NOTE — Telephone Encounter (Signed)
LMTCB

## 2013-02-26 NOTE — Telephone Encounter (Signed)
Pt called back He asks if any of his medications could be causing flushing in his face I explained metoprolol may do this since it is a vasodilator, but I have not heard of Xarelto causing this He verb understanding

## 2013-03-13 ENCOUNTER — Other Ambulatory Visit: Payer: Self-pay | Admitting: *Deleted

## 2013-03-13 MED ORDER — RIVAROXABAN 20 MG PO TABS: 20.0000 mg | ORAL_TABLET | Freq: Every day | ORAL | Status: DC

## 2013-03-13 NOTE — Telephone Encounter (Signed)
Refilled Xarelto sent to [facility].

## 2013-06-03 DIAGNOSIS — J301 Allergic rhinitis due to pollen: Secondary | ICD-10-CM | POA: Diagnosis not present

## 2013-06-11 DIAGNOSIS — J301 Allergic rhinitis due to pollen: Secondary | ICD-10-CM | POA: Diagnosis not present

## 2013-06-24 ENCOUNTER — Other Ambulatory Visit: Payer: Self-pay

## 2013-07-21 ENCOUNTER — Other Ambulatory Visit: Payer: Self-pay | Admitting: Cardiovascular Disease

## 2013-07-22 ENCOUNTER — Other Ambulatory Visit: Payer: Self-pay | Admitting: *Deleted

## 2013-07-22 MED ORDER — RIVAROXABAN 20 MG PO TABS: 20.0000 mg | ORAL_TABLET | Freq: Every day | ORAL | Status: DC

## 2013-07-22 NOTE — Telephone Encounter (Signed)
Refilled Xarelto sent to [facility] pharmacy.

## 2013-08-06 ENCOUNTER — Other Ambulatory Visit: Payer: Self-pay | Admitting: Cardiovascular Disease

## 2013-08-18 DIAGNOSIS — Z23 Encounter for immunization: Secondary | ICD-10-CM | POA: Diagnosis not present

## 2013-08-20 ENCOUNTER — Encounter: Payer: Self-pay | Admitting: Cardiovascular Disease

## 2013-09-06 ENCOUNTER — Other Ambulatory Visit: Payer: Self-pay | Admitting: Cardiovascular Disease

## 2013-09-07 ENCOUNTER — Other Ambulatory Visit: Payer: Self-pay | Admitting: *Deleted

## 2013-09-07 MED ORDER — METOPROLOL TARTRATE 100 MG PO TABS: ORAL_TABLET | ORAL | Status: DC

## 2013-09-07 NOTE — Telephone Encounter (Signed)
Requested Prescriptions   Signed Prescriptions Disp Refills  . metoprolol (LOPRESSOR) 100 MG tablet 60 tablet 3    Sig: TAKE 1 TABLET BY MOUTH TWICE DAILY    Authorizing Provider: Lorine Bears A    Ordering User: Kendrick Fries

## 2013-09-24 ENCOUNTER — Other Ambulatory Visit: Payer: Self-pay

## 2013-10-09 DIAGNOSIS — J301 Allergic rhinitis due to pollen: Secondary | ICD-10-CM | POA: Diagnosis not present

## 2013-10-14 DIAGNOSIS — J301 Allergic rhinitis due to pollen: Secondary | ICD-10-CM | POA: Diagnosis not present

## 2013-11-22 ENCOUNTER — Other Ambulatory Visit: Payer: Self-pay | Admitting: Cardiovascular Disease

## 2013-11-23 ENCOUNTER — Other Ambulatory Visit: Payer: Self-pay

## 2013-11-23 ENCOUNTER — Other Ambulatory Visit: Payer: Self-pay | Admitting: *Deleted

## 2013-11-23 MED ORDER — RIVAROXABAN 20 MG PO TABS: 20.0000 mg | ORAL_TABLET | Freq: Every day | ORAL | Status: DC

## 2013-11-23 NOTE — Telephone Encounter (Signed)
Requested Prescriptions   Signed Prescriptions Disp Refills  . Rivaroxaban (XARELTO) 20 MG TABS tablet 30 tablet 1    Sig: Take 1 tablet (20 mg total) by mouth daily.    Authorizing Provider: Lorine Bears A    Ordering User: Kendrick Fries

## 2013-11-23 NOTE — Telephone Encounter (Signed)
Refill sent for xarelto  

## 2013-11-24 ENCOUNTER — Other Ambulatory Visit: Payer: Self-pay | Admitting: Cardiovascular Disease

## 2013-11-24 DIAGNOSIS — J301 Allergic rhinitis due to pollen: Secondary | ICD-10-CM | POA: Diagnosis not present

## 2013-12-11 ENCOUNTER — Encounter: Payer: Self-pay | Admitting: Cardiovascular Disease

## 2013-12-17 ENCOUNTER — Encounter: Payer: Self-pay | Admitting: Cardiovascular Disease

## 2013-12-17 ENCOUNTER — Ambulatory Visit (INDEPENDENT_AMBULATORY_CARE_PROVIDER_SITE_OTHER): Payer: Medicare Other | Admitting: Cardiovascular Disease

## 2013-12-17 VITALS — BP 120/100 | HR 87 | Ht 73.0 in | Wt 179.5 lb

## 2013-12-17 DIAGNOSIS — Q211 Atrial septal defect: Secondary | ICD-10-CM | POA: Diagnosis not present

## 2013-12-17 DIAGNOSIS — I4891 Unspecified atrial fibrillation: Secondary | ICD-10-CM | POA: Diagnosis not present

## 2013-12-17 DIAGNOSIS — Q2111 Secundum atrial septal defect: Secondary | ICD-10-CM

## 2013-12-17 DIAGNOSIS — I428 Other cardiomyopathies: Secondary | ICD-10-CM | POA: Diagnosis not present

## 2013-12-17 DIAGNOSIS — I429 Cardiomyopathy, unspecified: Secondary | ICD-10-CM

## 2013-12-17 DIAGNOSIS — I5022 Chronic systolic (congestive) heart failure: Secondary | ICD-10-CM | POA: Diagnosis not present

## 2013-12-17 DIAGNOSIS — Q2112 Patent foramen ovale: Secondary | ICD-10-CM

## 2013-12-17 MED ORDER — LOSARTAN POTASSIUM 25 MG PO TABS: 25.0000 mg | ORAL_TABLET | Freq: Every day | ORAL | Status: DC

## 2013-12-17 NOTE — Patient Instructions (Signed)
Your physician has requested that you have an echocardiogram. Echocardiography is a painless test that uses sound waves to create images of your heart. It provides your doctor with information about the size and shape of your heart and how well your heart's chambers and valves are working. This procedure takes approximately one hour. There are no restrictions for this procedure.  Your physician has recommended you make the following change in your medication:  Increase Losartan to 25 mg daily   Your physician wants you to follow-up in: 6 months. You will receive a reminder letter in the mail two months in advance. If you don't receive a letter, please call our office to schedule the follow-up appointment.

## 2013-12-17 NOTE — Assessment & Plan Note (Signed)
This will be evaluated with the upcoming echocardiogram.

## 2013-12-17 NOTE — Progress Notes (Signed)
HPI  This is a 68 year old gentleman who is here today for a followup visit regarding chronic systolic heart failure, atrial fibrillation and a PFO. Initial outside Echocardiogram showed severely reduced LV systolic function with an ejection fraction of 25-30% with anterior wall hypokinesis. There was suggestion of a large left to right atrial shunting.  He was noted to be in atrial fibrillation with rapid ventricular response.  I started him on metoprolol for rate control. He underwent a transesophageal echocardiogram which showed an ejection fraction of 30-35% with normal atrial size. There was a tunneled PFO with a small to moderate left atrial shunting by color Doppler. He underwent a right and left cardiac catheterization in 07/2012 which showed no significant coronary artery disease with a QP/QS shunt ratio of 1.37.  He  underwent cardioversion to sinus rhythm but went back into atrial fibrillation after a few days. Thus, I placed him on amiodarone and proceeded with cardioversion after a few weeks. He converted to sinus rhythm after 3 shocks. He went back into atrial fibrillation after a few days. I elected to continue rate control given that he did not notice a difference between being in sinus rhythm versus atrial fibrillation. Most recent echocardiogram in January of 2014 showed an ejection fraction of 30-35%. He has been doing very well and denies chest pain or palpitations. He has been wearing CPAP for sleep apnea.  No Known Allergies   Current Outpatient Prescriptions on File Prior to Visit  Medication Sig Dispense Refill  . buPROPion (WELLBUTRIN SR) 100 MG 12 hr tablet Take 100 mg by mouth daily.      . clonazePAM (KLONOPIN) 0.5 MG tablet Take 0.25 mg by mouth every morning. Take 1/2 tablet twice daily.      [name] levothyroxine (SYNTHROID, LEVOTHROID) 75 MCG tablet Take 75 mcg by mouth daily.      [name] losartan (COZAAR) 25 MG tablet TAKE 1/2 TABLET BY MOUTH EVERY DAY  90 tablet  3  .  metoprolol (LOPRESSOR) 100 MG tablet TAKE 1 TABLET BY MOUTH TWICE DAILY  60 tablet  3  . Plant Sterols and Stanols (CHOLEST OFF PO) Take by mouth daily.      . Rivaroxaban (XARELTO) 20 MG TABS tablet Take 1 tablet (20 mg total) by mouth daily.  30 tablet  1  . sertraline (ZOLOFT) 100 MG tablet Take 100 mg by mouth daily.      . simvastatin (ZOCOR) 20 MG tablet Take 10 mg by mouth at bedtime.      [name] VITAMIN D, ERGOCALCIFEROL, PO Take 400 Units by mouth daily.      Carlena Hurl 20 MG TABS tablet TAKE ONE TABLET BY MOUTH EVERY DAY  30 tablet  0   No current facility-administered medications on file prior to visit.     Past Medical History  Diagnosis Date  . Asthma   . Hyperlipidemia   . Pancreatitis 1980    due to ETOH  . Chronic systolic heart failure 08/05/2012  . 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 Chronic systolic heart failure     Due to nonischemic cardiomyopathy. Cardiac catheterization in September of 2013 showed no significant coronary artery disease with an ejection fraction of 35%. Normal filling pressures with only mild pulmonary hypertension.     Past Surgical History  Procedure Laterality Date  . Cystoscopy w/ ureteroscopy    .  Cardiac catheterization  07/2012    [facility]  . Tee without cardioversion    . Cardioversion  09/15/12     Family History  Problem Relation Age of Onset  . Arrhythmia Sister     A-fib/stroke   . Hyperlipidemia Brother      History   Social History  . Marital Status: Married    Spouse Name: N/A    Number of Children: N/A  . Years of Education: N/A   Occupational History  . Not on file.   Social History Main Topics  . Smoking status: Former Smoker -- 0.50 packs/day for 20 years    Types: Cigarettes    Quit date: 08/07/2008  . Smokeless tobacco: Not on file  . Alcohol Use: No  . Drug Use: No  . Sexual Activity: Not on file    Other Topics Concern  . Not on file   Social History Narrative  . No narrative on file     PHYSICAL EXAM   BP 120/100  Pulse 87  Ht 6\' 1"  (1.854 m)  Wt 179 lb 8 oz (81.421 kg)  BMI 23.69 kg/m2  Constitutional: He is oriented to person, place, and time. He appears well-developed and well-nourished. No distress.  HENT: No nasal discharge.  Head: Normocephalic and atraumatic.  Eyes: Pupils are equal and round. Right eye exhibits no discharge. Left eye exhibits no discharge.  Neck: Normal range of motion. Neck supple. No JVD present. No thyromegaly present.  Cardiovascular:  normal rate , irregular rhythm, normal heart sounds and. Exam reveals no gallop and no friction rub. No murmur heard.  Pulmonary/Chest: Effort normal and breath sounds normal. No stridor. No respiratory distress. He has no wheezes. He has no rales. He exhibits no tenderness.  Abdominal: Soft. Bowel sounds are normal. He exhibits no distension. There is no tenderness. There is no rebound and no guarding.  Musculoskeletal: Normal range of motion. He exhibits no edema and no tenderness.  Neurological: He is alert and oriented to person, place, and time. Coordination normal.  Skin: Skin is warm and dry. No rash noted. He is not diaphoretic. No erythema. No pallor.  Psychiatric: He has a normal mood and affect. His behavior is normal. Judgment and thought content normal.     EKG: Atrial fibrillation  -ventricular rate is 87 beats per minute -  Nonspecific T-abnormality.   ABNORMAL   ASSESSMENT AND PLAN

## 2013-12-17 NOTE — Assessment & Plan Note (Signed)
He continues to be in atrial fibrillation with controlled ventricular rate. He was overall asymptomatic. Continue treatment with metoprolol and long-term anticoagulation with Xarelto. He reports no bleeding side effects.

## 2013-12-17 NOTE — Assessment & Plan Note (Addendum)
He appears to be euvolemic. Most recent ejection fraction was 30-35% in January of 2014. He is currently New York Heart Association class II. I recommend repeat echocardiogram to evaluate his LV systolic function. If EF is still significantly reduced, I recommend adding aldosterone blocking medication. Blood pressure is mildly elevated today. Recommend increasing losartan to 25 mg once daily

## 2013-12-25 ENCOUNTER — Other Ambulatory Visit: Payer: Self-pay | Admitting: Cardiovascular Disease

## 2013-12-25 ENCOUNTER — Other Ambulatory Visit: Payer: Medicare Other

## 2013-12-25 DIAGNOSIS — G4710 Hypersomnia, unspecified: Secondary | ICD-10-CM | POA: Diagnosis not present

## 2013-12-25 DIAGNOSIS — F411 Generalized anxiety disorder: Secondary | ICD-10-CM | POA: Diagnosis not present

## 2013-12-25 DIAGNOSIS — I4891 Unspecified atrial fibrillation: Secondary | ICD-10-CM | POA: Diagnosis not present

## 2013-12-25 DIAGNOSIS — E038 Other specified hypothyroidism: Secondary | ICD-10-CM | POA: Diagnosis not present

## 2013-12-25 DIAGNOSIS — F3289 Other specified depressive episodes: Secondary | ICD-10-CM | POA: Diagnosis not present

## 2013-12-25 DIAGNOSIS — F329 Major depressive disorder, single episode, unspecified: Secondary | ICD-10-CM | POA: Diagnosis not present

## 2013-12-25 DIAGNOSIS — E782 Mixed hyperlipidemia: Secondary | ICD-10-CM | POA: Diagnosis not present

## 2013-12-25 DIAGNOSIS — G4730 Sleep apnea, unspecified: Secondary | ICD-10-CM | POA: Diagnosis not present

## 2013-12-25 DIAGNOSIS — J309 Allergic rhinitis, unspecified: Secondary | ICD-10-CM | POA: Diagnosis not present

## 2013-12-25 DIAGNOSIS — I10 Essential (primary) hypertension: Secondary | ICD-10-CM | POA: Diagnosis not present

## 2013-12-25 DIAGNOSIS — Z0000 Encounter for general adult medical examination without abnormal findings: Secondary | ICD-10-CM | POA: Diagnosis not present

## 2013-12-25 DIAGNOSIS — J45909 Unspecified asthma, uncomplicated: Secondary | ICD-10-CM | POA: Diagnosis not present

## 2013-12-31 ENCOUNTER — Other Ambulatory Visit (INDEPENDENT_AMBULATORY_CARE_PROVIDER_SITE_OTHER): Payer: Medicare Other

## 2013-12-31 ENCOUNTER — Other Ambulatory Visit: Payer: Self-pay

## 2013-12-31 DIAGNOSIS — I509 Heart failure, unspecified: Secondary | ICD-10-CM | POA: Diagnosis not present

## 2013-12-31 DIAGNOSIS — Q2111 Secundum atrial septal defect: Secondary | ICD-10-CM | POA: Diagnosis not present

## 2013-12-31 DIAGNOSIS — I4891 Unspecified atrial fibrillation: Secondary | ICD-10-CM | POA: Diagnosis not present

## 2013-12-31 DIAGNOSIS — Q211 Atrial septal defect: Secondary | ICD-10-CM

## 2013-12-31 DIAGNOSIS — I428 Other cardiomyopathies: Secondary | ICD-10-CM

## 2013-12-31 DIAGNOSIS — I429 Cardiomyopathy, unspecified: Secondary | ICD-10-CM

## 2014-01-18 DIAGNOSIS — J301 Allergic rhinitis due to pollen: Secondary | ICD-10-CM | POA: Diagnosis not present

## 2014-01-19 ENCOUNTER — Other Ambulatory Visit: Payer: Self-pay | Admitting: Cardiovascular Disease

## 2014-01-23 ENCOUNTER — Other Ambulatory Visit: Payer: Self-pay | Admitting: Cardiovascular Disease

## 2014-01-27 DIAGNOSIS — R945 Abnormal results of liver function studies: Secondary | ICD-10-CM | POA: Diagnosis not present

## 2014-02-01 DIAGNOSIS — J301 Allergic rhinitis due to pollen: Secondary | ICD-10-CM | POA: Diagnosis not present

## 2014-02-15 ENCOUNTER — Other Ambulatory Visit: Payer: Self-pay | Admitting: Cardiovascular Disease

## 2014-02-21 ENCOUNTER — Other Ambulatory Visit: Payer: Self-pay | Admitting: Cardiovascular Disease

## 2014-03-03 DIAGNOSIS — F4001 Agoraphobia with panic disorder: Secondary | ICD-10-CM | POA: Diagnosis not present

## 2014-03-03 DIAGNOSIS — F329 Major depressive disorder, single episode, unspecified: Secondary | ICD-10-CM | POA: Diagnosis not present

## 2014-03-03 DIAGNOSIS — F3289 Other specified depressive episodes: Secondary | ICD-10-CM | POA: Diagnosis not present

## 2014-03-10 DIAGNOSIS — J309 Allergic rhinitis, unspecified: Secondary | ICD-10-CM | POA: Diagnosis not present

## 2014-03-10 DIAGNOSIS — I4891 Unspecified atrial fibrillation: Secondary | ICD-10-CM | POA: Diagnosis not present

## 2014-03-10 DIAGNOSIS — J060 Acute laryngopharyngitis: Secondary | ICD-10-CM | POA: Diagnosis not present

## 2014-03-10 DIAGNOSIS — E039 Hypothyroidism, unspecified: Secondary | ICD-10-CM | POA: Diagnosis not present

## 2014-05-07 DIAGNOSIS — J301 Allergic rhinitis due to pollen: Secondary | ICD-10-CM | POA: Diagnosis not present

## 2014-05-12 DIAGNOSIS — J301 Allergic rhinitis due to pollen: Secondary | ICD-10-CM | POA: Diagnosis not present

## 2014-05-19 NOTE — Telephone Encounter (Signed)
This encounter was created in error - please disregard.

## 2014-06-16 DIAGNOSIS — R109 Unspecified abdominal pain: Secondary | ICD-10-CM | POA: Diagnosis not present

## 2014-06-16 DIAGNOSIS — R197 Diarrhea, unspecified: Secondary | ICD-10-CM | POA: Diagnosis not present

## 2014-06-16 DIAGNOSIS — K589 Irritable bowel syndrome without diarrhea: Secondary | ICD-10-CM | POA: Diagnosis not present

## 2014-06-16 DIAGNOSIS — I4891 Unspecified atrial fibrillation: Secondary | ICD-10-CM | POA: Diagnosis not present

## 2014-06-16 DIAGNOSIS — K219 Gastro-esophageal reflux disease without esophagitis: Secondary | ICD-10-CM | POA: Diagnosis not present

## 2014-06-22 ENCOUNTER — Ambulatory Visit: Payer: Self-pay | Admitting: Physician Assistant

## 2014-06-22 ENCOUNTER — Telehealth: Payer: Self-pay | Admitting: *Deleted

## 2014-06-22 DIAGNOSIS — I5020 Unspecified systolic (congestive) heart failure: Secondary | ICD-10-CM | POA: Diagnosis not present

## 2014-06-22 DIAGNOSIS — D62 Acute posthemorrhagic anemia: Secondary | ICD-10-CM | POA: Diagnosis not present

## 2014-06-22 DIAGNOSIS — J438 Other emphysema: Secondary | ICD-10-CM | POA: Diagnosis not present

## 2014-06-22 DIAGNOSIS — R109 Unspecified abdominal pain: Secondary | ICD-10-CM | POA: Diagnosis not present

## 2014-06-22 DIAGNOSIS — R5381 Other malaise: Secondary | ICD-10-CM | POA: Diagnosis not present

## 2014-06-22 DIAGNOSIS — R197 Diarrhea, unspecified: Secondary | ICD-10-CM | POA: Diagnosis not present

## 2014-06-22 DIAGNOSIS — R0602 Shortness of breath: Secondary | ICD-10-CM | POA: Diagnosis not present

## 2014-06-22 DIAGNOSIS — R609 Edema, unspecified: Secondary | ICD-10-CM | POA: Diagnosis not present

## 2014-06-22 DIAGNOSIS — D518 Other vitamin B12 deficiency anemias: Secondary | ICD-10-CM | POA: Diagnosis not present

## 2014-06-22 DIAGNOSIS — R5383 Other fatigue: Secondary | ICD-10-CM | POA: Diagnosis not present

## 2014-06-22 NOTE — Telephone Encounter (Signed)
Please call patient he is swelling in calfs and ankles. He is also having sob.

## 2014-06-22 NOTE — Telephone Encounter (Signed)
Returned call to patient. Left message.

## 2014-06-22 NOTE — Telephone Encounter (Signed)
Patient stated that he was having some swelling and shortness of breath  Patient had an appt at [facility] today and was started on Lasix  He has an appt with Dr. Kirke Corin this week and just wanted him to know

## 2014-06-24 ENCOUNTER — Telehealth: Payer: Self-pay | Admitting: *Deleted

## 2014-06-24 ENCOUNTER — Other Ambulatory Visit: Payer: Self-pay | Admitting: *Deleted

## 2014-06-24 ENCOUNTER — Ambulatory Visit (INDEPENDENT_AMBULATORY_CARE_PROVIDER_SITE_OTHER): Payer: Medicare Other | Admitting: Cardiovascular Disease

## 2014-06-24 ENCOUNTER — Encounter: Payer: Self-pay | Admitting: Cardiovascular Disease

## 2014-06-24 VITALS — BP 110/82 | HR 140 | Ht 72.0 in | Wt 178.5 lb

## 2014-06-24 DIAGNOSIS — R0602 Shortness of breath: Secondary | ICD-10-CM | POA: Diagnosis not present

## 2014-06-24 DIAGNOSIS — R5381 Other malaise: Secondary | ICD-10-CM | POA: Diagnosis not present

## 2014-06-24 DIAGNOSIS — K219 Gastro-esophageal reflux disease without esophagitis: Secondary | ICD-10-CM | POA: Diagnosis not present

## 2014-06-24 DIAGNOSIS — I5022 Chronic systolic (congestive) heart failure: Secondary | ICD-10-CM

## 2014-06-24 DIAGNOSIS — I4891 Unspecified atrial fibrillation: Secondary | ICD-10-CM | POA: Diagnosis not present

## 2014-06-24 DIAGNOSIS — G2581 Restless legs syndrome: Secondary | ICD-10-CM | POA: Diagnosis not present

## 2014-06-24 DIAGNOSIS — R5383 Other fatigue: Secondary | ICD-10-CM | POA: Diagnosis not present

## 2014-06-24 DIAGNOSIS — R197 Diarrhea, unspecified: Secondary | ICD-10-CM | POA: Diagnosis not present

## 2014-06-24 MED ORDER — DIGOXIN 125 MCG PO TABS: 0.1250 mg | ORAL_TABLET | Freq: Every day | ORAL | Status: DC

## 2014-06-24 MED ORDER — DIGOXIN 250 MCG PO TABS: 0.2500 mg | ORAL_TABLET | Freq: Every day | ORAL | Status: DC

## 2014-06-24 NOTE — Telephone Encounter (Signed)
Attempted to call patient to let him know that Dr Kirke Corin is decreasing his Digoxin from 0.25 mg once daily to 0.125 mg once daily  Left voice mail  Informed pharmacy to disregard the previous order

## 2014-06-24 NOTE — Telephone Encounter (Signed)
Patient returned call  I informed him of medication changes  Patient verbalized understanding

## 2014-06-24 NOTE — Patient Instructions (Addendum)
Your physician has recommended you make the following change in your medication:  Start Digoxin 0.125 mg once daily    Your physician recommends that you schedule a follow-up appointment in:  1 week with Dr. Kirke Corin

## 2014-06-24 NOTE — Progress Notes (Signed)
Primary care physician: Dr. Fozia Khan  HPI  This is a 68 year old gentleman who is hBeverely Risenere today for a followup visit regarding chronic systolic heart failure, atrial fibrillation and a PFO. Initial outside Echocardiogram showed severely reduced LV systolic function with an ejection fraction of 25-30% with anterior wall hypokinesis. There was suggestion of a large left to right atrial shunting.  He was noted to be in atrial fibrillation with rapid ventricular response. He underwent a transesophageal echocardiogram which showed an ejection fraction of 30-35% with normal atrial size. There was a tunneled PFO with a small to moderate left atrial shunting by color Doppler. He underwent a right and left cardiac catheterization in 07/2012 which showed no significant coronary artery disease with a QP/QS shunt ratio of 1.37.  He  underwent cardioversion to sinus rhythm but went back into atrial fibrillation after a few days. Thus, I placed him on amiodarone and proceeded with cardioversion after a few weeks. He converted to sinus rhythm after 3 shocks. He went back into atrial fibrillation after a few days. I elected to continue rate control given that he did not notice a difference between being in sinus rhythm versus atrial fibrillation. Most recent echocardiogram in January of 2014 showed an ejection fraction of 30-35%. He has been wearing CPAP for sleep apnea. He reports worsening symptoms of shortness of breath, orthopnea and lower extremity edema with weight gain. He has been under significant stress at home and reports tachycardia. He was started on Lasix 2 days ago with some improvement in symptoms. Labs showed unremarkable CBC except for an MCV of 104. Creatinine was 1.32 with a BUN of 18.  No Known Allergies   Current Outpatient Prescriptions on File Prior to Visit  Medication Sig Dispense Refill  . buPROPion (WELLBUTRIN SR) 100 MG 12 hr tablet Take 100 mg by mouth daily.      . clonazePAM  (KLONOPIN) 0.5 MG tablet Take 0.25 mg by mouth every morning. Take 1/2 tablet twice daily.      . fluticasone (FLONASE) 50 MCG/ACT nasal spray Place 1 spray into both nostrils daily.       [name]. levothyroxine (SYNTHROID, LEVOTHROID) 75 MCG tablet Take 75 mcg by mouth daily.      [name]. losartan (COZAAR) 25 MG tablet Take 1 tablet (25 mg total) by mouth daily.  90 tablet  3  . metoprolol (LOPRESSOR) 100 MG tablet TAKE 1 TABLET BY MOUTH TWICE DAILY  180 tablet  3  . Plant Sterols and Stanols (CHOLEST OFF PO) Take by mouth daily.      . sertraline (ZOLOFT) 100 MG tablet Take 100 mg by mouth daily.      . simvastatin (ZOCOR) 20 MG tablet Take 10 mg by mouth at bedtime.      [name]. VITAMIN D, ERGOCALCIFEROL, PO Take 400 Units by mouth daily.      Carlena Hurl. XARELTO 20 MG TABS tablet TAKE 1 TABLET BY MOUTH DAILY  30 tablet  5   No current facility-administered medications on file prior to visit.     Past Medical History  Diagnosis Date  . Asthma   . Hyperlipidemia   . Pancreatitis 1980    due to ETOH  . Chronic systolic heart failure 08/05/2012  . 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 Chronic systolic heart failure     Due to nonischemic cardiomyopathy. Cardiac  catheterization in September of 2013 showed no significant coronary artery disease with an ejection fraction of 35%. Normal filling pressures with only mild pulmonary hypertension.     Past Surgical History  Procedure Laterality Date  . Cystoscopy w/ ureteroscopy    . Cardiac catheterization  07/2012    [facility]  . Tee without cardioversion    . Cardioversion  09/15/12     Family History  Problem Relation Age of Onset  . Arrhythmia Sister     A-fib/stroke   . Hyperlipidemia Brother      History   Social History  . Marital Status: Married    Spouse Name: N/A    Number of Children: N/A  . Years of Education: N/A   Occupational History    . Not on file.   Social History Main Topics  . Smoking status: Former Smoker -- 0.50 packs/day for 20 years    Types: Cigarettes    Quit date: 08/07/2008  . Smokeless tobacco: Not on file  . Alcohol Use: No  . Drug Use: No  . Sexual Activity: Not on file   Other Topics Concern  . Not on file   Social History Narrative  . No narrative on file     PHYSICAL EXAM   BP 110/82  Pulse 140  Ht 6' (1.829 m)  Wt 178 lb 8 oz (80.967 kg)  BMI 24.20 kg/m2  Constitutional: He is oriented to person, place, and time. He appears well-developed and well-nourished. No distress.  HENT: No nasal discharge.  Head: Normocephalic and atraumatic.  Eyes: Pupils are equal and round. Right eye exhibits no discharge. Left eye exhibits no discharge.  Neck: Normal range of motion. Neck supple. No JVD present. No thyromegaly present.  Cardiovascular:  Tachycardic , irregular rhythm, normal heart sounds and. Exam reveals no gallop and no friction rub. No murmur heard.  Pulmonary/Chest: Effort normal and breath sounds normal. No stridor. No respiratory distress. He has no wheezes. He has no rales. He exhibits no tenderness.  Abdominal: Soft. Bowel sounds are normal. He exhibits no distension. There is no tenderness. There is no rebound and no guarding.  Musculoskeletal: Normal range of motion. He exhibits no edema and no tenderness.  Neurological: He is alert and oriented to person, place, and time. Coordination normal.  Skin: Skin is warm and dry. No rash noted. He is not diaphoretic. No erythema. No pallor.  Psychiatric: He has a normal mood and affect. His behavior is normal. Judgment and thought content normal.     EKG: Atrial fibrillation  -Left axis.   -Nonspecific ST depression   +   Nonspecific T-abnormality  -Nondiagnostic.   ABNORMAL   ASSESSMENT AND PLAN

## 2014-06-28 ENCOUNTER — Encounter: Payer: Self-pay | Admitting: Cardiovascular Disease

## 2014-06-28 NOTE — Assessment & Plan Note (Signed)
The patient seems to be having uncontrolled ventricular rate which might be contributing to heart failure. He has failed treatment with amiodarone in the past. I added digoxin 0.125 mg once daily. Continue metoprolol. Check labs next week with basic metabolic profile and digoxin level. Continue anticoagulation with Xarelto.

## 2014-06-28 NOTE — Assessment & Plan Note (Signed)
He appears to be fluid overloaded. The cause of decompensation is likely atrial fibrillation with rapid ventricular response. Continue current dose of Lasix. I will consider repeating his echocardiogram once ventricular rate is reasonably controlled.

## 2014-06-30 ENCOUNTER — Encounter: Payer: Self-pay | Admitting: Cardiovascular Disease

## 2014-07-01 ENCOUNTER — Encounter: Payer: Self-pay | Admitting: Cardiovascular Disease

## 2014-07-01 ENCOUNTER — Ambulatory Visit (INDEPENDENT_AMBULATORY_CARE_PROVIDER_SITE_OTHER): Payer: Medicare Other | Admitting: Cardiovascular Disease

## 2014-07-01 VITALS — BP 114/88 | HR 90 | Ht 73.0 in | Wt 166.5 lb

## 2014-07-01 DIAGNOSIS — I5022 Chronic systolic (congestive) heart failure: Secondary | ICD-10-CM

## 2014-07-01 DIAGNOSIS — I4891 Unspecified atrial fibrillation: Secondary | ICD-10-CM | POA: Diagnosis not present

## 2014-07-01 NOTE — Patient Instructions (Signed)
Your physician recommends that you have labs today: Basic Metabolic Panel  Digoxin Level   Your physician recommends that you continue on your current medications as directed. Please refer to the Current Medication list given to you today.  Your physician recommends that you schedule a follow-up appointment in:  3 months

## 2014-07-01 NOTE — Assessment & Plan Note (Signed)
I suspect that recent decompensation was related to atrial fibrillation with rapid ventricular response. His weight is back to baseline. Continue small dose Lasix. Check basic metabolic profile today.

## 2014-07-01 NOTE — Progress Notes (Signed)
Primary care physician: Dr. Beverely Risen  HPI  This is a 68 year old gentleman who is here today for a followup visit regarding chronic systolic heart failure, atrial fibrillation and a PFO. Initial outside Echocardiogram showed severely reduced LV systolic function with an ejection fraction of 25-30% with anterior wall hypokinesis. There was suggestion of a large left to right atrial shunting.  He was noted to be in atrial fibrillation with rapid ventricular response. He underwent a transesophageal echocardiogram which showed an ejection fraction of 30-35% with normal atrial size. There was a tunneled PFO with a small to moderate left atrial shunting by color Doppler. He underwent a right and left cardiac catheterization in 07/2012 which showed no significant coronary artery disease with a QP/QS shunt ratio of 1.37.  He  underwent cardioversion to sinus rhythm but went back into atrial fibrillation after a few days. Thus, I placed him on amiodarone and proceeded with cardioversion after a few weeks. He converted to sinus rhythm after 3 shocks. He went back into atrial fibrillation after a few days. I elected to continue rate control given that he did not notice a difference between being in sinus rhythm versus atrial fibrillation. Most recent echocardiogram in January of 2014 showed an ejection fraction of 30-35%. He has been wearing CPAP for sleep apnea. He was seen recently for worsening shortness of breath, orthopnea and lower extremity edema with weight gain. He was started on Lasix with gradual improvement. He was noted to be very tachycardic during last visit. I added small dose digoxin once daily. He reports significant improvement in symptoms. He reports that he feels back to his normal baseline.   No Known Allergies   Current Outpatient Prescriptions on File Prior to Visit  Medication Sig Dispense Refill  . buPROPion (WELLBUTRIN SR) 100 MG 12 hr tablet Take 100 mg by mouth daily.      .  clonazePAM (KLONOPIN) 0.5 MG tablet Take 0.25 mg by mouth every morning. Take 1/2 tablet twice daily.      . digoxin (LANOXIN) 0.125 MG tablet Take 1 tablet (0.125 mg total) by mouth daily.  90 tablet  3  . fluticasone (FLONASE) 50 MCG/ACT nasal spray Place 1 spray into both nostrils daily.       . furosemide (LASIX) 20 MG tablet Take 20 mg by mouth daily.      [name] levothyroxine (SYNTHROID, LEVOTHROID) 75 MCG tablet Take 75 mcg by mouth daily.      [name] losartan (COZAAR) 25 MG tablet Take 1 tablet (25 mg total) by mouth daily.  90 tablet  3  . metoprolol (LOPRESSOR) 100 MG tablet TAKE 1 TABLET BY MOUTH TWICE DAILY  180 tablet  3  . Plant Sterols and Stanols (CHOLEST OFF PO) Take by mouth daily.      . sertraline (ZOLOFT) 100 MG tablet Take 100 mg by mouth daily.      . simvastatin (ZOCOR) 20 MG tablet Take 10 mg by mouth at bedtime.      [name] VITAMIN D, ERGOCALCIFEROL, PO Take 400 Units by mouth daily.      Carlena Hurl 20 MG TABS tablet TAKE 1 TABLET BY MOUTH DAILY  30 tablet  5   No current facility-administered medications on file prior to visit.     Past Medical History  Diagnosis Date  . Asthma   . Hyperlipidemia   . Pancreatitis 1980    due to ETOH  . Chronic systolic heart failure 08/05/2012  . 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 Chronic systolic heart failure     Due to nonischemic cardiomyopathy. Cardiac catheterization in September of 2013 showed no significant coronary artery disease with an ejection fraction of 35%. Normal filling pressures with only mild pulmonary hypertension.     Past Surgical History  Procedure Laterality Date  . Cystoscopy w/ ureteroscopy    . Cardiac catheterization  07/2012    [facility]  . Tee without cardioversion    . Cardioversion  09/15/12     Family History  Problem Relation Age of Onset  . Arrhythmia Sister     A-fib/stroke   . Hyperlipidemia  Brother      History   Social History  . Marital Status: Married    Spouse Name: N/A    Number of Children: N/A  . Years of Education: N/A   Occupational History  . Not on file.   Social History Main Topics  . Smoking status: Former Smoker -- 0.50 packs/day for 20 years    Types: Cigarettes    Quit date: 08/07/2008  . Smokeless tobacco: Not on file  . Alcohol Use: No  . Drug Use: No  . Sexual Activity: Not on file   Other Topics Concern  . Not on file   Social History Narrative  . No narrative on file     PHYSICAL EXAM   BP 114/88  Pulse 90  Ht 6\' 1"  (1.854 m)  Wt 166 lb 8 oz (75.524 kg)  BMI 21.97 kg/m2  Constitutional: He is oriented to person, place, and time. He appears well-developed and well-nourished. No distress.  HENT: No nasal discharge.  Head: Normocephalic and atraumatic.  Eyes: Pupils are equal and round. Right eye exhibits no discharge. Left eye exhibits no discharge.  Neck: Normal range of motion. Neck supple. No JVD present. No thyromegaly present.  Cardiovascular:  Tachycardic , irregular rhythm, normal heart sounds and. Exam reveals no gallop and no friction rub. No murmur heard.  Pulmonary/Chest: Effort normal and breath sounds normal. No stridor. No respiratory distress. He has no wheezes. He has no rales. He exhibits no tenderness.  Abdominal: Soft. Bowel sounds are normal. He exhibits no distension. There is no tenderness. There is no rebound and no guarding.  Musculoskeletal: Normal range of motion. He exhibits no edema and no tenderness.  Neurological: He is alert and oriented to person, place, and time. Coordination normal.  Skin: Skin is warm and dry. No rash noted. He is not diaphoretic. No erythema. No pallor.  Psychiatric: He has a normal mood and affect. His behavior is normal. Judgment and thought content normal.     EKG: Atrial fibrillation  -irregular conduction  -Left axis -anterior fascicular block.   -Nonspecific ST  depression   +   Nonspecific T-abnormality  -Nondiagnostic.   ABNORMAL    ASSESSMENT AND PLAN

## 2014-07-01 NOTE — Assessment & Plan Note (Signed)
Ventricular rate is much better controlled after having digoxin. He feels significantly better. Check digoxin level today. Continue other medications.

## 2014-07-02 LAB — BASIC METABOLIC PANEL
BUN / CREAT RATIO: 10 (ref 10–22)
BUN: 12 mg/dL (ref 8–27)
CO2: 27 mmol/L (ref 18–29)
Calcium: 8.7 mg/dL (ref 8.6–10.2)
Chloride: 99 mmol/L (ref 97–108)
Creatinine, Ser: 1.2 mg/dL (ref 0.76–1.27)
GFR calc Af Amer: 72 mL/min/{1.73_m2} (ref >59)
GFR calc non Af Amer: 63 mL/min/{1.73_m2} (ref >59)
Glucose: 91 mg/dL (ref 65–99)
Potassium: 4.2 mmol/L (ref 3.5–5.2)
SODIUM: 143 mmol/L (ref 134–144)

## 2014-07-02 LAB — DIGOXIN LEVEL: Digoxin Level: 0.8 ng/mL — ABNORMAL LOW (ref 0.9–2.0)

## 2014-07-05 ENCOUNTER — Telehealth: Payer: Self-pay | Admitting: *Deleted

## 2014-07-05 NOTE — Telephone Encounter (Signed)
Yes. That's fine.  

## 2014-07-05 NOTE — Telephone Encounter (Signed)
Reviewed lab results with patient  Patient asked if we can refill lasix even though it was ordered by Dr. Welton Flakes

## 2014-07-05 NOTE — Telephone Encounter (Signed)
Patient called wanting his lab results. 

## 2014-07-06 MED ORDER — FUROSEMIDE 20 MG PO TABS: 20.0000 mg | ORAL_TABLET | Freq: Every day | ORAL | Status: DC

## 2014-07-06 NOTE — Telephone Encounter (Signed)
Informed patient lasix has been refilled patient verbalized understanding

## 2014-07-09 DIAGNOSIS — I4891 Unspecified atrial fibrillation: Secondary | ICD-10-CM | POA: Diagnosis not present

## 2014-07-12 ENCOUNTER — Telehealth: Payer: Self-pay | Admitting: *Deleted

## 2014-07-12 NOTE — Telephone Encounter (Signed)
Labs sent to [facility] as requested

## 2014-07-21 DIAGNOSIS — Z23 Encounter for immunization: Secondary | ICD-10-CM | POA: Diagnosis not present

## 2014-08-17 ENCOUNTER — Other Ambulatory Visit: Payer: Self-pay | Admitting: Cardiovascular Disease

## 2014-08-20 DIAGNOSIS — J301 Allergic rhinitis due to pollen: Secondary | ICD-10-CM | POA: Diagnosis not present

## 2014-08-23 DIAGNOSIS — N189 Chronic kidney disease, unspecified: Secondary | ICD-10-CM | POA: Diagnosis not present

## 2014-08-23 DIAGNOSIS — D539 Nutritional anemia, unspecified: Secondary | ICD-10-CM | POA: Diagnosis not present

## 2014-08-23 DIAGNOSIS — R7989 Other specified abnormal findings of blood chemistry: Secondary | ICD-10-CM | POA: Diagnosis not present

## 2014-08-23 DIAGNOSIS — E782 Mixed hyperlipidemia: Secondary | ICD-10-CM | POA: Diagnosis not present

## 2014-08-23 DIAGNOSIS — Z79899 Other long term (current) drug therapy: Secondary | ICD-10-CM | POA: Diagnosis not present

## 2014-08-26 DIAGNOSIS — J301 Allergic rhinitis due to pollen: Secondary | ICD-10-CM | POA: Diagnosis not present

## 2014-09-16 DIAGNOSIS — J0140 Acute pansinusitis, unspecified: Secondary | ICD-10-CM | POA: Diagnosis not present

## 2014-09-16 DIAGNOSIS — R0981 Nasal congestion: Secondary | ICD-10-CM | POA: Diagnosis not present

## 2014-09-28 ENCOUNTER — Encounter: Payer: Self-pay | Admitting: Cardiovascular Disease

## 2014-10-01 ENCOUNTER — Ambulatory Visit: Payer: Medicare Other | Admitting: Cardiovascular Disease

## 2014-10-04 ENCOUNTER — Encounter: Payer: Self-pay | Admitting: Cardiovascular Disease

## 2014-10-04 ENCOUNTER — Ambulatory Visit (INDEPENDENT_AMBULATORY_CARE_PROVIDER_SITE_OTHER): Payer: Medicare Other | Admitting: Cardiovascular Disease

## 2014-10-04 VITALS — BP 104/80 | HR 81 | Ht 73.0 in | Wt 167.4 lb

## 2014-10-04 DIAGNOSIS — I4891 Unspecified atrial fibrillation: Secondary | ICD-10-CM | POA: Diagnosis not present

## 2014-10-04 NOTE — Patient Instructions (Signed)
Continue same medications.   Your physician wants you to follow-up in: 6 months.  You will receive a reminder letter in the mail two months in advance. If you don't receive a letter, please call our office to schedule the follow-up appointment.  

## 2014-10-04 NOTE — Assessment & Plan Note (Signed)
Most recent ejection fraction was 40-45%. He is currently New York Heart Association class II. He appears to be euvolemic and uses furosemide only as needed.

## 2014-10-04 NOTE — Assessment & Plan Note (Signed)
He is doing very well overall with rate control. He is tolerating anticoagulation with no side effects.

## 2014-10-04 NOTE — Progress Notes (Signed)
Primary care physician: Dr. Beverely RisenFozia Khan  HPI  This is a 68 year old gentleman who is here today for a followup visit regarding chronic systolic heart failure, atrial fibrillation and a PFO. Initial outside Echocardiogram showed severely reduced LV systolic function with an ejection fraction of 25-30% with anterior wall hypokinesis. There was suggestion of a large left to right atrial shunting.  He was noted to be in atrial fibrillation with rapid ventricular response. He underwent a transesophageal echocardiogram which showed an ejection fraction of 30-35% with normal atrial size. There was a tunneled PFO with a small to moderate left atrial shunting by color Doppler. He underwent a right and left cardiac catheterization in 07/2012 which showed no significant coronary artery disease with a QP/QS shunt ratio of 1.37.  He  underwent cardioversion to sinus rhythm but went back into atrial fibrillation after a few days. Thus, I placed him on amiodarone and proceeded with cardioversion after a few weeks. He converted to sinus rhythm after 3 shocks. He went back into atrial fibrillation after a few days. I elected to continue rate control given that he did not notice a difference between being in sinus rhythm versus atrial fibrillation. Most recent echocardiogram in February 2015 showed an ejection fraction of 40-45%. He has been wearing CPAP for sleep apnea. He has been doing very well overall with resolution of heart failure symptoms. He is using furosemide only as needed. He denies chest pain, shortness of breath or palpitations.   No Known Allergies   Current Outpatient Prescriptions on File Prior to Visit  Medication Sig Dispense Refill  . buPROPion (WELLBUTRIN SR) 100 MG 12 hr tablet Take 100 mg by mouth daily.    . clonazePAM (KLONOPIN) 0.5 MG tablet Take 0.25 mg by mouth every morning. Take 1/2 tablet twice daily.    . digoxin (LANOXIN) 0.125 MG tablet Take 1 tablet (0.125 mg total) by mouth  daily. 90 tablet 3  . fluticasone (FLONASE) 50 MCG/ACT nasal spray Place 1 spray into both nostrils daily.     . furosemide (LASIX) 20 MG tablet Take 1 tablet (20 mg total) by mouth daily. 30 tablet 6  . levothyroxine (SYNTHROID, LEVOTHROID) 75 MCG tablet Take 75 mcg by mouth daily.    [name]. losartan (COZAAR) 25 MG tablet Take 1 tablet (25 mg total) by mouth daily. 90 tablet 3  . metoprolol (LOPRESSOR) 100 MG tablet TAKE 1 TABLET BY MOUTH TWICE DAILY 180 tablet 3  . Plant Sterols and Stanols (CHOLEST OFF PO) Take by mouth daily.    . sertraline (ZOLOFT) 100 MG tablet Take 100 mg by mouth daily.    . simvastatin (ZOCOR) 20 MG tablet Take 10 mg by mouth at bedtime.    [name]. VITAMIN D, ERGOCALCIFEROL, PO Take 400 Units by mouth daily.    Carlena Hurl. XARELTO 20 MG TABS tablet TAKE 1 TABLET BY MOUTH EVERY DAY 30 tablet 03   No current facility-administered medications on file prior to visit.     Past Medical History  Diagnosis Date  . Asthma   . Hyperlipidemia   . Pancreatitis 1980    due to ETOH  . Chronic systolic heart failure 08/05/2012  . 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 Chronic systolic heart failure     Due to nonischemic cardiomyopathy. Cardiac catheterization in September of 2013 showed no significant coronary artery disease with  an ejection fraction of 35%. Normal filling pressures with only mild pulmonary hypertension.     Past Surgical History  Procedure Laterality Date  . Cystoscopy w/ ureteroscopy    . Cardiac catheterization  07/2012    [facility]  . Tee without cardioversion    . Cardioversion  09/15/12     Family History  Problem Relation Age of Onset  . Arrhythmia Sister     A-fib/stroke   . Hyperlipidemia Brother      History   Social History  . Marital Status: Married    Spouse Name: N/A    Number of Children: N/A  . Years of Education: N/A   Occupational History   . Not on file.   Social History Main Topics  . Smoking status: Former Smoker -- 0.50 packs/day for 20 years    Types: Cigarettes    Quit date: 08/07/2008  . Smokeless tobacco: Not on file  . Alcohol Use: No  . Drug Use: No  . Sexual Activity: Not on file   Other Topics Concern  . Not on file   Social History Narrative     PHYSICAL EXAM   BP 104/80 mmHg  Pulse 81  Ht 6\' 1"  (1.854 m)  Wt 167 lb 6.4 oz (75.932 kg)  BMI 22.09 kg/m2  Constitutional: He is oriented to person, place, and time. He appears well-developed and well-nourished. No distress.  HENT: No nasal discharge.  Head: Normocephalic and atraumatic.  Eyes: Pupils are equal and round. Right eye exhibits no discharge. Left eye exhibits no discharge.  Neck: Normal range of motion. Neck supple. No JVD present. No thyromegaly present.  Cardiovascular:  Normal rate, irregular rhythm, normal heart sounds and. Exam reveals no gallop and no friction rub. No murmur heard.  Pulmonary/Chest: Effort normal and breath sounds normal. No stridor. No respiratory distress. He has no wheezes. He has no rales. He exhibits no tenderness.  Abdominal: Soft. Bowel sounds are normal. He exhibits no distension. There is no tenderness. There is no rebound and no guarding.  Musculoskeletal: Normal range of motion. He exhibits no edema and no tenderness.  Neurological: He is alert and oriented to person, place, and time. Coordination normal.  Skin: Skin is warm and dry. No rash noted. He is not diaphoretic. No erythema. No pallor.  Psychiatric: He has a normal mood and affect. His behavior is normal. Judgment and thought content normal.     EKG: Atrial fibrillation  -irregular conduction  -Left axis -anterior fascicular block.   -Nonspecific ST depression   +   Nonspecific T-abnormality  -Nondiagnostic.   ABNORMAL    ASSESSMENT AND PLAN

## 2014-11-04 ENCOUNTER — Encounter: Payer: Self-pay | Admitting: Cardiovascular Disease

## 2014-11-13 IMAGING — CR DG CHEST 2V
1 series · 2 of 2 positions shown · non-contrast
Comparison: None.

CLINICAL DATA: Difficulty breathing

EXAM:
CHEST  2 VIEW

[Series 1: kdxr chest pa (or ap) and lat · 0.14mm/px · 2 of 2 slices shown]
[im 1/2]
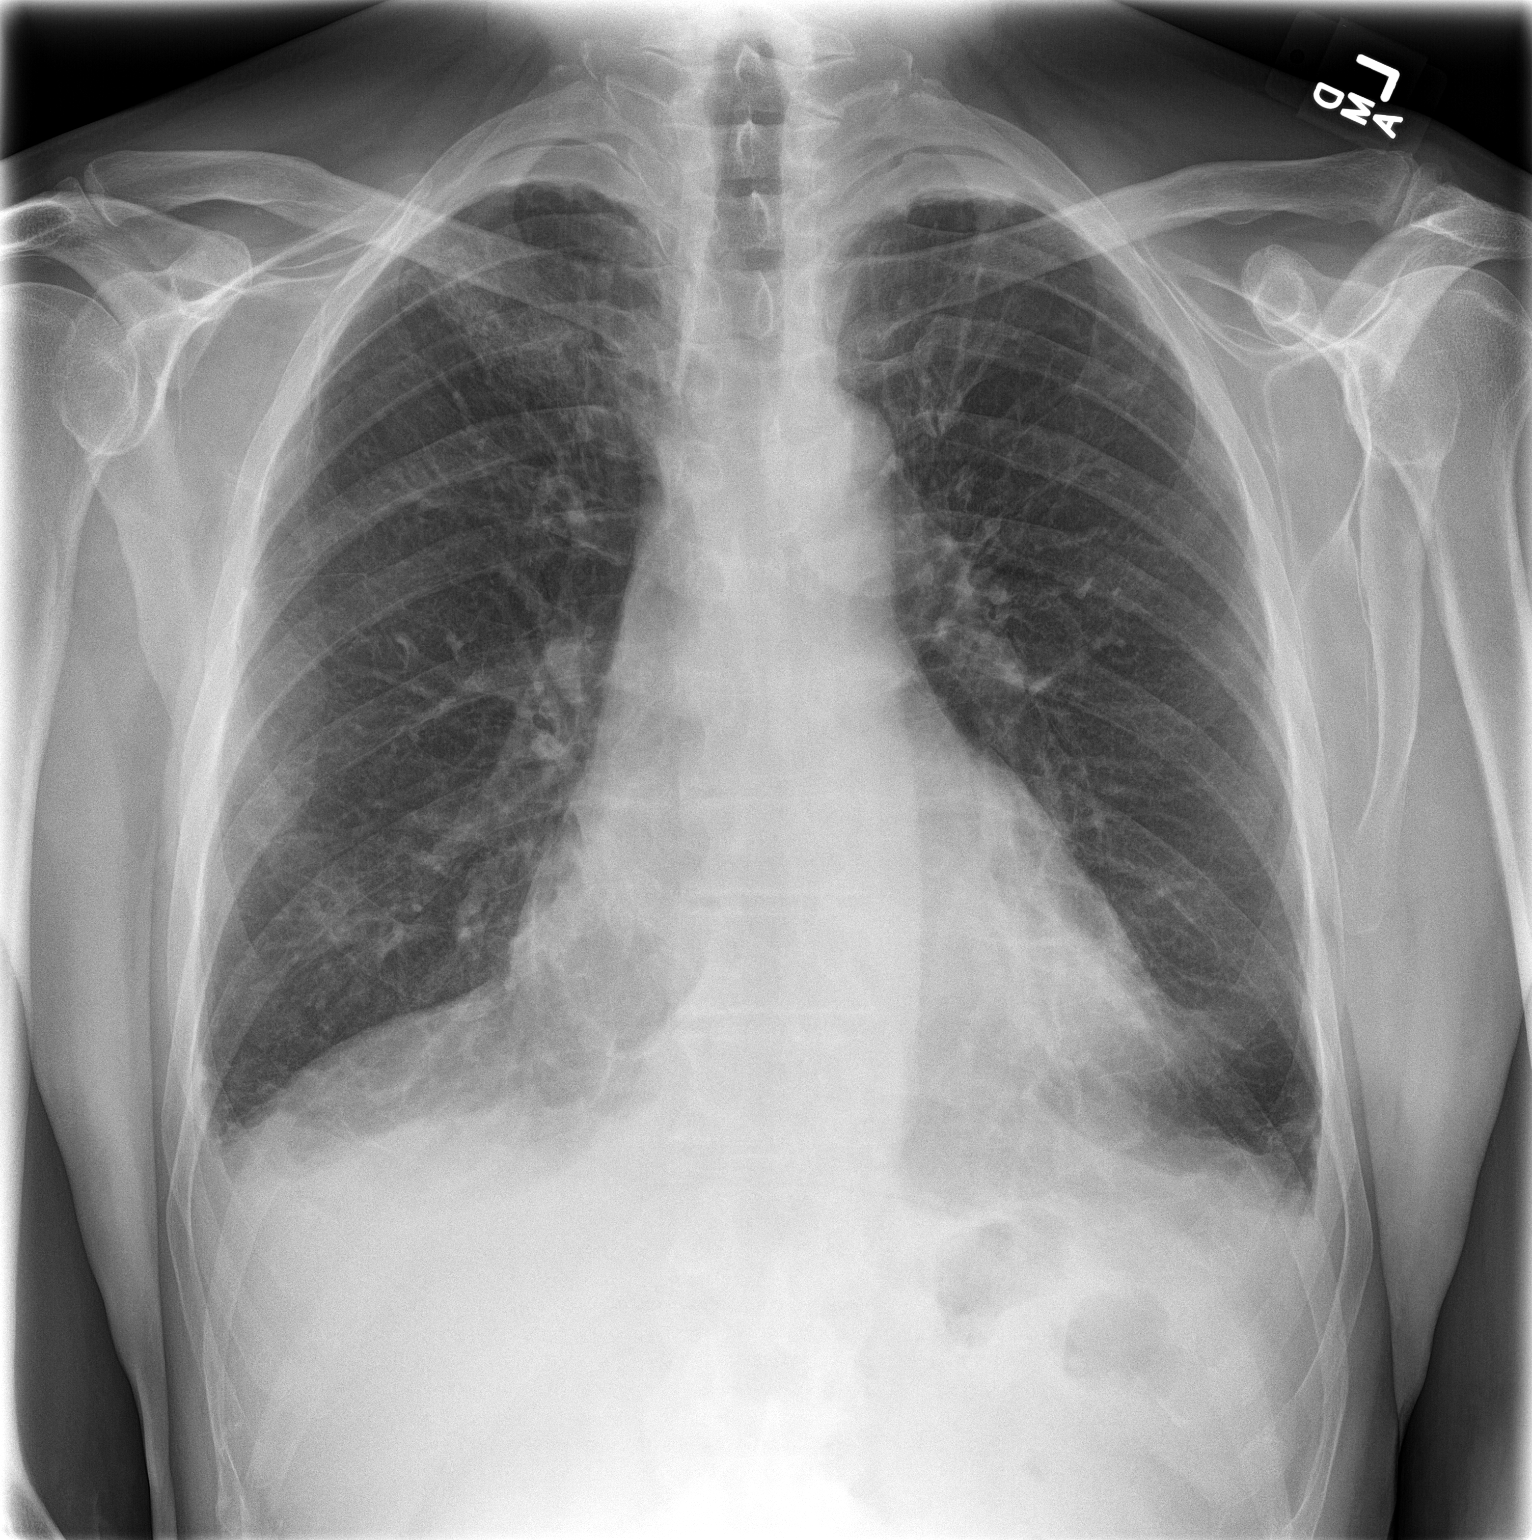
[im 2/2]
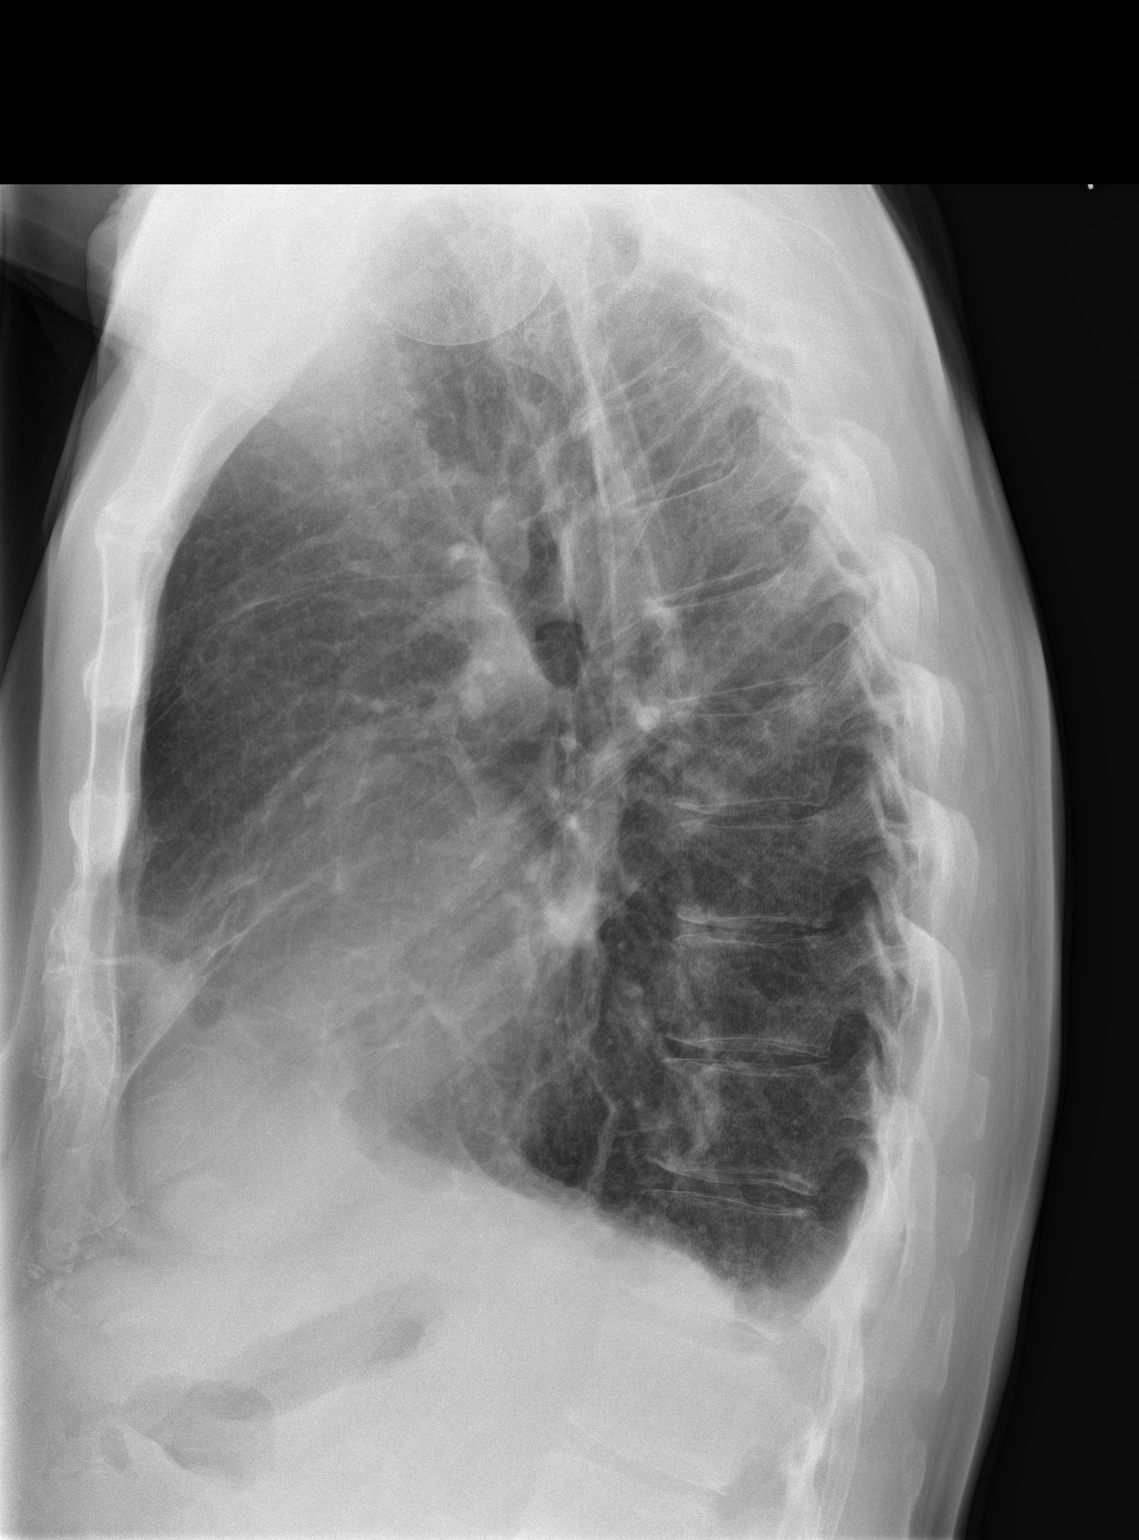

[2 of 2 positions shown; findings below may reference images not displayed]

FINDINGS: There is underlying emphysematous change. There is symmetric apical
pleural thickening bilaterally. There are scattered areas of mild
scarring bilaterally. There is no edema or consolidation. Heart size
is within normal limits. Pulmonary vascularity reflects underlying
emphysema. No adenopathy. No bone lesions.
IMPRESSION: Underlying emphysematous change.  No edema or consolidation.

## 2014-11-17 DIAGNOSIS — J301 Allergic rhinitis due to pollen: Secondary | ICD-10-CM | POA: Diagnosis not present

## 2014-12-22 DIAGNOSIS — E039 Hypothyroidism, unspecified: Secondary | ICD-10-CM | POA: Diagnosis not present

## 2014-12-22 DIAGNOSIS — E782 Mixed hyperlipidemia: Secondary | ICD-10-CM | POA: Diagnosis not present

## 2014-12-22 DIAGNOSIS — R300 Dysuria: Secondary | ICD-10-CM | POA: Diagnosis not present

## 2014-12-22 DIAGNOSIS — F329 Major depressive disorder, single episode, unspecified: Secondary | ICD-10-CM | POA: Diagnosis not present

## 2014-12-22 DIAGNOSIS — F411 Generalized anxiety disorder: Secondary | ICD-10-CM | POA: Diagnosis not present

## 2014-12-22 DIAGNOSIS — I4891 Unspecified atrial fibrillation: Secondary | ICD-10-CM | POA: Diagnosis not present

## 2014-12-24 ENCOUNTER — Telehealth: Payer: Self-pay | Admitting: Physician Assistant

## 2014-12-24 ENCOUNTER — Telehealth: Payer: Self-pay | Admitting: *Deleted

## 2014-12-24 MED ORDER — APIXABAN 5 MG PO TABS: 5.0000 mg | ORAL_TABLET | Freq: Two times a day (BID) | ORAL | Status: DC

## 2014-12-24 NOTE — Telephone Encounter (Signed)
Informed patient of Dr. Sheilah Pigeon response Patient stated he did not want to use Warfarin  He is going to contact his pharmacy and ask about Eliquis He will call us back when he decides

## 2014-12-24 NOTE — Telephone Encounter (Signed)
xerlto is now 300 a shot, from medicare.   What options does patient have?   He can not afford this.   Please advise

## 2014-12-24 NOTE — Telephone Encounter (Signed)
Change to eliquis due to cost of Xarelto.  Pharmacy needed a script to check the cost.  Michayla Mcneil PAC

## 2014-12-24 NOTE — Telephone Encounter (Signed)
That is the cash price. Doesn't he have any prescription coverage. Other options include Eliquis or Warfarin.

## 2014-12-24 NOTE — Telephone Encounter (Signed)
Patient stated Xarelto is now $300 for him  He would like to know if Dr. Kirke Corin can suggest anything cheaper?

## 2014-12-29 NOTE — Telephone Encounter (Signed)
Patient called back to say he decided to go with Eliquis and he may in the future need samples but not needed today.

## 2014-12-29 NOTE — Telephone Encounter (Signed)
Patient called to inform Dr. Kirke Corin that he prefers to stay on Eliquis instead of changing to Coumadin

## 2015-01-24 ENCOUNTER — Other Ambulatory Visit: Payer: Self-pay

## 2015-01-24 MED ORDER — LOSARTAN POTASSIUM 25 MG PO TABS: 25.0000 mg | ORAL_TABLET | Freq: Every day | ORAL | Status: DC

## 2015-01-24 NOTE — Telephone Encounter (Signed)
Refill sent for losartan 25 mg

## 2015-01-26 ENCOUNTER — Telehealth: Payer: Self-pay | Admitting: Physician Assistant

## 2015-01-26 NOTE — Telephone Encounter (Signed)
PA for Eliquis - change due to cost per note - has been faxed to [facility] office to be completed - patient of Dr. Kirke Corin

## 2015-01-26 NOTE — Telephone Encounter (Signed)
Morrie Sheldon called in stating that she has been faxing over a prior authorization for the pt's Eliquis since 3/4 and she would like to know if he has received it. Please call  Thanks

## 2015-01-28 ENCOUNTER — Telehealth: Payer: Self-pay | Admitting: Cardiovascular Disease

## 2015-01-28 MED ORDER — APIXABAN 5 MG PO TABS: 5.0000 mg | ORAL_TABLET | Freq: Two times a day (BID) | ORAL | Status: DC

## 2015-01-28 NOTE — Telephone Encounter (Signed)
Faxed prior authorization at 3:46 on 01/28/2015.

## 2015-01-28 NOTE — Telephone Encounter (Signed)
°  1. Which medications need to be refilled?Eliquis 5 mg po twice daily   2. Which pharmacy is medication to be sent to?[facility]   3. Do they need a 30 day or 90 day supply? 90  4. Would they like a call back once the medication has been sent to the pharmacy? Yes

## 2015-01-28 NOTE — Telephone Encounter (Signed)
Refill sent for eliquis 5 mg.  

## 2015-01-28 NOTE — Telephone Encounter (Signed)
LMOM that we just received the Eliquis 5 mg prior authorization today at 11:43; told that someone will contact him as soon as the prior authorization is complete.

## 2015-01-28 NOTE — Telephone Encounter (Signed)
Jeffery Shaw called to check on status of medication refill for eliquis.  Patient was advised that [name] sent Prescription to Pharmacy.  Pharmacy needs prior authorization as this is not covered by insurance w/o it.   Please call patient when ready.

## 2015-01-31 NOTE — Telephone Encounter (Signed)
Notified patient Eliquis 5 mg is approved through 01/28/2016.

## 2015-03-08 NOTE — Op Note (Signed)
PATIENT NAME:  Jeffery Shaw, Jeffery Shaw MR#:  [id number] DATE OF BIRTH:  [birth year]  DATE OF PROCEDURE:  09/15/2012  PROCEDURE PERFORMED: Cardioversion.   PREOPERATIVE DIAGNOSIS: Atrial fibrillation and cardiomyopathy.   ATTENDING: Kolby Myung A. Kirke Corin, MD   CLINICAL HISTORY: Mr. Bardi is a pleasant 69 year old gentleman with recently diagnosed nonischemic cardiomyopathy and atrial fibrillation. The patient was started on metoprolol as well as Xarelto for anticoagulation. He has been anticoagulated continuously for a minimum of three weeks without interruption. Due to his cardiomyopathy and atrial fibrillation which has been symptomatic, I recommended cardioversion to try to maintain sinus rhythm. Risks, benefits, and alternatives were discussed with the patient.   PROCEDURE IN DETAIL: A standard informed consent was obtained. Time-out was performed. The patient was given fentanyl, Versed, and propofol by anesthesia. He was then shocked twice with 200 synchronized joules. He converted to sinus rhythm the second time. He was noted to have frequent PACs.   CONCLUSION: Successful cardioversion from atrial fibrillation to normal sinus rhythm.   RECOMMENDATIONS: Continue metoprolol as well as anticoagulation with Xarelto. Advance heart failure medications as tolerated.    ____________________________ Chelsea Aus. Kirke Corin, MD maa:drc D: 09/15/2012 07:48:26 ET T: 09/15/2012 09:01:38 ET JOB#: [id number]  cc: Rayne Loiseau A. Kirke Corin, MD, <Dictator> [place] Argentina Donovan MD ELECTRONICALLY SIGNED 09/15/2012 9:37

## 2015-03-11 NOTE — Op Note (Signed)
PATIENT NAME:  Jeffery Shaw, Jeffery Shaw MR#:  [id number] DATE OF BIRTH:  10/25/1946  DATE OF PROCEDURE:  12/25/2012  PROCEDURE PERFORMED: Cardioversion.   INDICATION: Recurrent atrial fibrillation/flutter with cardiomyopathy. The patient has been started on amiodarone and has been fully anticoagulated with Xarelto.  PROCEDURE DETAILS: A standard informed consent was obtained. The cardioversion pads were applied in the anterior-posterior fashion. A timeout was performed. The patient was given propofol by anesthesia. He was shocked 3 times with 200 joules. The first two times he converted to sinus rhythm, but stayed in sinus for only about 20 seconds and went back to atrial flutter. The third time he maintained in sinus rhythm with PACs.   CONCLUSION: Successful cardioversion of atrial fibrillation to normal sinus rhythm.   RECOMMENDATIONS: Continue current medications including amiodarone, metoprolol and Xarelto. ____________________________ Chelsea Aus. Kirke Corin, MD maa:sb D: 12/25/2012 09:52:27 ET T: 12/25/2012 10:05:26 ET JOB#: [id number]  cc: Muhammad A. Kirke Corin, MD, <Dictator> [place] Ouch MD ELECTRONICALLY SIGNED 01/09/2013 13:59

## 2015-03-13 ENCOUNTER — Other Ambulatory Visit: Payer: Self-pay | Admitting: Cardiovascular Disease

## 2015-03-18 DIAGNOSIS — J301 Allergic rhinitis due to pollen: Secondary | ICD-10-CM | POA: Diagnosis not present

## 2015-03-22 DIAGNOSIS — J301 Allergic rhinitis due to pollen: Secondary | ICD-10-CM | POA: Diagnosis not present

## 2015-03-25 DIAGNOSIS — F3341 Major depressive disorder, recurrent, in partial remission: Secondary | ICD-10-CM | POA: Diagnosis not present

## 2015-04-05 ENCOUNTER — Ambulatory Visit: Payer: Medicare Other | Admitting: Cardiovascular Disease

## 2015-04-26 ENCOUNTER — Ambulatory Visit: Payer: Medicare Other | Admitting: Cardiovascular Disease

## 2015-05-26 DIAGNOSIS — H2513 Age-related nuclear cataract, bilateral: Secondary | ICD-10-CM | POA: Diagnosis not present

## 2015-05-26 DIAGNOSIS — H25013 Cortical age-related cataract, bilateral: Secondary | ICD-10-CM | POA: Diagnosis not present

## 2015-05-30 ENCOUNTER — Ambulatory Visit (INDEPENDENT_AMBULATORY_CARE_PROVIDER_SITE_OTHER): Payer: Medicare Other | Admitting: Cardiovascular Disease

## 2015-05-30 ENCOUNTER — Encounter: Payer: Self-pay | Admitting: Cardiovascular Disease

## 2015-05-30 VITALS — BP 120/78 | HR 59 | Ht 73.0 in | Wt 168.5 lb

## 2015-05-30 DIAGNOSIS — I5022 Chronic systolic (congestive) heart failure: Secondary | ICD-10-CM | POA: Diagnosis not present

## 2015-05-30 DIAGNOSIS — Q211 Atrial septal defect: Secondary | ICD-10-CM | POA: Diagnosis not present

## 2015-05-30 DIAGNOSIS — Q2112 Patent foramen ovale: Secondary | ICD-10-CM

## 2015-05-30 DIAGNOSIS — I4891 Unspecified atrial fibrillation: Secondary | ICD-10-CM

## 2015-05-30 NOTE — Assessment & Plan Note (Signed)
He has chronic atrial fibrillation. Ventricular rate is well controlled on metoprolol and digoxin. Continue same medications. He is tolerating anticoagulation with no side effects.

## 2015-05-30 NOTE — Assessment & Plan Note (Signed)
He appears to be euvolemic off diuretics. Most recent ejection fraction was 40-45%. Continue treatment with metoprolol and losartan.

## 2015-05-30 NOTE — Progress Notes (Signed)
Primary care physician: Dr. Beverely Risen  HPI  This is a 69 year old gentleman who is here today for a followup visit regarding chronic systolic heart failure, atrial fibrillation and a PFO. Initial outside Echocardiogram showed severely reduced LV systolic function with an ejection fraction of 25-30% with anterior wall hypokinesis. There was suggestion of a large left to right atrial shunting.  He was noted to be in atrial fibrillation with rapid ventricular response. He underwent a transesophageal echocardiogram which showed an ejection fraction of 30-35% with normal atrial size. There was a tunneled PFO with a small to moderate left atrial shunting by color Doppler. He underwent a right and left cardiac catheterization in 07/2012 which showed no significant coronary artery disease with a QP/QS shunt ratio of 1.37.  He  underwent cardioversion to sinus rhythm but went back into atrial fibrillation after a few days. Thus, I placed him on amiodarone and proceeded with cardioversion after a few weeks. He converted to sinus rhythm after 3 shocks. He went back into atrial fibrillation after a few days. I elected to continue rate control given that he did not notice a difference between being in sinus rhythm versus atrial fibrillation. Most recent echocardiogram in February 2015 showed an ejection fraction of 40-45%. He has been wearing CPAP for sleep apnea. He has been doing very well overall with resolution of heart failure symptoms. He is using furosemide only as needed. He denies chest pain, shortness of breath or palpitations. Xarelto was switched to Eliquis for cost reasons.    No Known Allergies   Current Outpatient Prescriptions on File Prior to Visit  Medication Sig Dispense Refill  . apixaban (ELIQUIS) 5 MG TABS tablet Take 1 tablet (5 mg total) by mouth 2 (two) times daily. 60 tablet 6  . buPROPion (WELLBUTRIN SR) 100 MG 12 hr tablet Take 100 mg by mouth daily.    . clonazePAM (KLONOPIN) 0.5  MG tablet Take 0.25 mg by mouth every morning. Take 1/2 tablet twice daily.    . digoxin (LANOXIN) 0.125 MG tablet Take 1 tablet (0.125 mg total) by mouth daily. 90 tablet 3  . fluticasone (FLONASE) 50 MCG/ACT nasal spray Place 1 spray into both nostrils daily as needed.     . furosemide (LASIX) 20 MG tablet Take 1 tablet (20 mg total) by mouth daily. (Patient taking differently: Take 20 mg by mouth daily as needed. ) 30 tablet 6  . levothyroxine (SYNTHROID, LEVOTHROID) 75 MCG tablet Take 75 mcg by mouth daily.    [name] losartan (COZAAR) 25 MG tablet Take 1 tablet (25 mg total) by mouth daily. 90 tablet 3  . metoprolol (LOPRESSOR) 100 MG tablet TAKE 1 TABLET BY MOUTH TWICE DAILY 180 tablet 3  . Plant Sterols and Stanols (CHOLEST OFF PO) Take by mouth daily.    [name] rOPINIRole (REQUIP) 0.5 MG tablet Take 0.5 mg by mouth as needed.  3  . sertraline (ZOLOFT) 100 MG tablet Take 100 mg by mouth daily.    . simvastatin (ZOCOR) 20 MG tablet Take 10 mg by mouth at bedtime.    [name] VITAMIN D, ERGOCALCIFEROL, PO Take 400 Units by mouth daily.    [name] zolpidem (AMBIEN) 5 MG tablet Take 5 mg by mouth as needed.  2   No current facility-administered medications on file prior to visit.     Past Medical History  Diagnosis Date  . Asthma   . Hyperlipidemia   . Pancreatitis 1980    due to ETOH  . Chronic  systolic heart failure 08/05/2012  . 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 Chronic systolic heart failure     Due to nonischemic cardiomyopathy. Cardiac catheterization in September of 2013 showed no significant coronary artery disease with an ejection fraction of 35%. Normal filling pressures with only mild pulmonary hypertension.     Past Surgical History  Procedure Laterality Date  . Cystoscopy w/ ureteroscopy    . Cardiac catheterization  07/2012    [facility]  . Tee without cardioversion    . Cardioversion   09/15/12     Family History  Problem Relation Age of Onset  . Arrhythmia Sister     A-fib/stroke   . Hyperlipidemia Brother      History   Social History  . Marital Status: Married    Spouse Name: N/A  . Number of Children: N/A  . Years of Education: N/A   Occupational History  . Not on file.   Social History Main Topics  . Smoking status: Former Smoker -- 0.50 packs/day for 20 years    Types: Cigarettes    Quit date: 08/07/2008  . Smokeless tobacco: Not on file  . Alcohol Use: No  . Drug Use: No  . Sexual Activity: Not on file   Other Topics Concern  . Not on file   Social History Narrative     PHYSICAL EXAM   BP 120/78 mmHg  Pulse 59  Ht  (1.854 m)  Wt 168 lb 8 oz (76.431 kg)  BMI 22.24 kg/m2  Constitutional: He is oriented to person, place, and time. He appears well-developed and well-nourished. No distress.  HENT: No nasal discharge.  Head: Normocephalic and atraumatic.  Eyes: Pupils are equal and round. Right eye exhibits no discharge. Left eye exhibits no discharge.  Neck: Normal range of motion. Neck supple. No JVD present. No thyromegaly present.  Cardiovascular:  Normal rate, irregular rhythm, normal heart sounds and. Exam reveals no gallop and no friction rub. No murmur heard.  Pulmonary/Chest: Effort normal and breath sounds normal. No stridor. No respiratory distress. He has no wheezes. He has no rales. He exhibits no tenderness.  Abdominal: Soft. Bowel sounds are normal. He exhibits no distension. There is no tenderness. There is no rebound and no guarding.  Musculoskeletal: Normal range of motion. He exhibits no edema and no tenderness.  Neurological: He is alert and oriented to person, place, and time. Coordination normal.  Skin: Skin is warm and dry. No rash noted. He is not diaphoretic. No erythema. No pallor.  Psychiatric: He has a normal mood and affect. His behavior is normal. Judgment and thought content normal.     EKG: Atrial  fibrillation  -irregular conduction  ABNORMAL RHYTHM    ASSESSMENT AND PLAN

## 2015-05-30 NOTE — Assessment & Plan Note (Signed)
This has been stable on most recent echocardiogram.

## 2015-05-30 NOTE — Patient Instructions (Signed)
Medication Instructions: Continue same medications.   Labwork: None.   Procedures/Testing: None.   Follow-Up: 6 months with Dr. Alajia Schmelzer.   Any Additional Special Instructions Will Be Listed Below (If Applicable).   

## 2015-06-30 ENCOUNTER — Other Ambulatory Visit: Payer: Self-pay | Admitting: Cardiovascular Disease

## 2015-07-02 DIAGNOSIS — Z23 Encounter for immunization: Secondary | ICD-10-CM | POA: Diagnosis not present

## 2015-07-08 DIAGNOSIS — J301 Allergic rhinitis due to pollen: Secondary | ICD-10-CM | POA: Diagnosis not present

## 2015-07-12 DIAGNOSIS — J301 Allergic rhinitis due to pollen: Secondary | ICD-10-CM | POA: Diagnosis not present

## 2015-07-14 ENCOUNTER — Other Ambulatory Visit: Payer: Self-pay | Admitting: Cardiovascular Disease

## 2015-08-16 DIAGNOSIS — Z23 Encounter for immunization: Secondary | ICD-10-CM | POA: Diagnosis not present

## 2015-08-23 DIAGNOSIS — J301 Allergic rhinitis due to pollen: Secondary | ICD-10-CM | POA: Diagnosis not present

## 2015-09-02 ENCOUNTER — Telehealth: Payer: Self-pay | Admitting: Cardiovascular Disease

## 2015-09-02 NOTE — Telephone Encounter (Signed)
Patient calling the office for samples of medication:   1.  What medication and dosage are you requesting samples for? Eliquis 5 mg po twice daily   2.  Are you currently out of this medication?  No   3. Are you requesting samples to get you through until you receive your prescription? No, patient says next refill will increase in price for his deductible for insurance (?donut hole) and he is shopping around for a better plan.  Would like 1 month samples to hold him until new plan changes and he can afford   Please call when ready

## 2015-09-02 NOTE — Telephone Encounter (Signed)
Samples of Eliquis 5 mg placed at front desk for pick up.

## 2015-09-26 DIAGNOSIS — F3341 Major depressive disorder, recurrent, in partial remission: Secondary | ICD-10-CM | POA: Diagnosis not present

## 2015-10-18 ENCOUNTER — Telehealth: Payer: Self-pay | Admitting: Cardiovascular Disease

## 2015-10-18 NOTE — Telephone Encounter (Signed)
Patient wants to talk about watchman device.  Please call.

## 2015-10-19 NOTE — Telephone Encounter (Signed)
Please call new number [phone]

## 2015-10-19 NOTE — Telephone Encounter (Signed)
S/w pt wife, Consuella Lose, who states pt is not home at this time. Left message for pt to CB

## 2015-10-20 NOTE — Telephone Encounter (Signed)
Follow up    Returning called back to nurse

## 2015-10-20 NOTE — Telephone Encounter (Signed)
Left message on machine for patient to contact the office.   

## 2015-10-21 NOTE — Telephone Encounter (Signed)
S/w pt who states his brother brought him a article on the Watchman Device and he is interested in the procedure and/or ablation if Dr. Kirke Corin is agreeable.  He has mentioned to MD before as he does not like taking "all this medication". States he would like Dr. Kirke Corin to review his situation again and advise.  He has appt 12/12/15

## 2015-10-23 NOTE — Telephone Encounter (Signed)
This requires an office visit for discussion.

## 2015-10-24 NOTE — Telephone Encounter (Signed)
S/w pt regarding Dr. Jari Sportsman recommendations to discuss at OV. Pt agreeable to further discussion at January OV. Pt had no further questions.

## 2015-10-28 ENCOUNTER — Telehealth: Payer: Self-pay | Admitting: Cardiovascular Disease

## 2015-10-28 DIAGNOSIS — F3341 Major depressive disorder, recurrent, in partial remission: Secondary | ICD-10-CM | POA: Diagnosis not present

## 2015-10-28 DIAGNOSIS — J301 Allergic rhinitis due to pollen: Secondary | ICD-10-CM | POA: Diagnosis not present

## 2015-10-28 NOTE — Telephone Encounter (Signed)
Patient wants to talk about lab results he sent over from insurance company.  See scanned lab.

## 2015-10-28 NOTE — Telephone Encounter (Signed)
S/w pt who provided lab results from 11/29. They have been scanned into the system. He is concerned of the elevated Cardiac Markers. Results show 2430. Would like to make Dr. Kirke Corin aware. States he takes "weird" supplements as well as viagra and cialis and that Dr. Kirke Corin is unaware. States he took viagra the day of his lab draw. Advised him to bring current list of medications at next OV and I will forward labs to MD.  Pt is agreeable w/plan.

## 2015-10-30 NOTE — Telephone Encounter (Signed)
His BNP is elevated which is likely not new due to chronic heart failure. Is long as he is feeling ok, no need to change anything for now.

## 2015-10-31 DIAGNOSIS — J301 Allergic rhinitis due to pollen: Secondary | ICD-10-CM | POA: Diagnosis not present

## 2015-10-31 NOTE — Telephone Encounter (Signed)
Pt not at work number. No answer, No VM on cell phone. Will call again

## 2015-10-31 NOTE — Telephone Encounter (Signed)
S/w pt of Dr. Jari Sportsman review of labs and recommendations. States he continues to see "flashy things" in his eyes periodically and has been told he has cataracts covering 1/3 of his eye. Suggested he f/u w/eye doctor. Pt states he see them regularly. Pt verbalized understanding.

## 2015-11-01 DIAGNOSIS — I4891 Unspecified atrial fibrillation: Secondary | ICD-10-CM | POA: Diagnosis not present

## 2015-11-01 DIAGNOSIS — J4540 Moderate persistent asthma, uncomplicated: Secondary | ICD-10-CM | POA: Diagnosis not present

## 2015-11-01 DIAGNOSIS — E039 Hypothyroidism, unspecified: Secondary | ICD-10-CM | POA: Diagnosis not present

## 2015-11-01 DIAGNOSIS — F329 Major depressive disorder, single episode, unspecified: Secondary | ICD-10-CM | POA: Diagnosis not present

## 2015-11-01 DIAGNOSIS — R5383 Other fatigue: Secondary | ICD-10-CM | POA: Diagnosis not present

## 2015-11-01 DIAGNOSIS — E782 Mixed hyperlipidemia: Secondary | ICD-10-CM | POA: Diagnosis not present

## 2015-11-22 ENCOUNTER — Telehealth: Payer: Self-pay | Admitting: *Deleted

## 2015-11-22 NOTE — Telephone Encounter (Signed)
Eliquis 5 mg samples placed at front desk for pick up. 

## 2015-11-22 NOTE — Telephone Encounter (Signed)
Patient called for samples of apixaban (ELIQUIS) 5 MG TABS tablet. Please call patient when ready for pick up. Thanks!

## 2015-11-25 ENCOUNTER — Telehealth: Payer: Self-pay | Admitting: *Deleted

## 2015-11-25 NOTE — Telephone Encounter (Signed)
Pt calling asking for coupon card for Eliquis  Please call when ready.

## 2015-11-25 NOTE — Telephone Encounter (Signed)
Eliquis coupon cards at front desk for pick up.

## 2015-12-12 ENCOUNTER — Other Ambulatory Visit: Payer: Self-pay

## 2015-12-12 ENCOUNTER — Encounter: Payer: Self-pay | Admitting: Cardiovascular Disease

## 2015-12-12 ENCOUNTER — Ambulatory Visit: Payer: Medicare Other | Admitting: Cardiovascular Disease

## 2015-12-12 MED ORDER — APIXABAN 5 MG PO TABS: 5.0000 mg | ORAL_TABLET | Freq: Two times a day (BID) | ORAL | Status: DC

## 2016-01-19 ENCOUNTER — Encounter: Payer: Self-pay | Admitting: Cardiovascular Disease

## 2016-01-23 ENCOUNTER — Ambulatory Visit: Payer: Medicare Other | Admitting: Cardiovascular Disease

## 2016-01-23 ENCOUNTER — Other Ambulatory Visit: Payer: Self-pay

## 2016-01-23 MED ORDER — LOSARTAN POTASSIUM 25 MG PO TABS: 25.0000 mg | ORAL_TABLET | Freq: Every day | ORAL | Status: DC

## 2016-01-30 DIAGNOSIS — F3341 Major depressive disorder, recurrent, in partial remission: Secondary | ICD-10-CM | POA: Diagnosis not present

## 2016-02-03 DIAGNOSIS — J301 Allergic rhinitis due to pollen: Secondary | ICD-10-CM | POA: Diagnosis not present

## 2016-02-04 ENCOUNTER — Other Ambulatory Visit: Payer: Self-pay | Admitting: Cardiovascular Disease

## 2016-02-08 DIAGNOSIS — J301 Allergic rhinitis due to pollen: Secondary | ICD-10-CM | POA: Diagnosis not present

## 2016-03-02 ENCOUNTER — Ambulatory Visit: Payer: Medicare Other | Admitting: Cardiovascular Disease

## 2016-03-06 ENCOUNTER — Telehealth: Payer: Self-pay | Admitting: Cardiovascular Disease

## 2016-03-06 NOTE — Telephone Encounter (Signed)
Lm with work for patient to call back, Need to reschedule appointment on 03/23/16 with Dr Kirke Corin

## 2016-03-13 ENCOUNTER — Other Ambulatory Visit: Payer: Self-pay | Admitting: Cardiovascular Disease

## 2016-03-20 ENCOUNTER — Ambulatory Visit: Payer: Medicare Other | Admitting: Cardiovascular Disease

## 2016-03-23 ENCOUNTER — Ambulatory Visit: Payer: Medicare Other | Admitting: Cardiovascular Disease

## 2016-03-29 DIAGNOSIS — H25042 Posterior subcapsular polar age-related cataract, left eye: Secondary | ICD-10-CM | POA: Diagnosis not present

## 2016-04-09 ENCOUNTER — Ambulatory Visit (INDEPENDENT_AMBULATORY_CARE_PROVIDER_SITE_OTHER): Payer: Medicare Other | Admitting: Cardiovascular Disease

## 2016-04-09 ENCOUNTER — Encounter: Payer: Self-pay | Admitting: Cardiovascular Disease

## 2016-04-09 VITALS — BP 104/72 | HR 64 | Ht 72.0 in | Wt 167.5 lb

## 2016-04-09 DIAGNOSIS — I4891 Unspecified atrial fibrillation: Secondary | ICD-10-CM | POA: Diagnosis not present

## 2016-04-09 NOTE — Progress Notes (Signed)
Cardiology Office Note   Date:  04/09/2016   ID:  YONA STANSBURY, Park Meo 1945-12-06, MRN [id number]  PCP:  Lyndon Code, MD  Cardiologist:   Lorine Bears, MD   Chief Complaint  Patient presents with  . other    6 month f/u no complaints. Meds reviewed verbally with pt.      History of Present Illness: Jeffery Shaw is a 70 y.o. male who presents for a followup visit regarding chronic systolic heart failure, chronic atrial fibrillation and a small PFO.  TEE in 2013 showed an ejection fraction of 30-35% with normal atrial size. There was a tunneled PFO with a small to moderate left atrial shunting by color Doppler. He underwent a right and left cardiac catheterization in 07/2012 which showed no significant coronary artery disease with a QP/QS shunt ratio of 1.37.  Most recent echocardiogram in February 2015 showed an ejection fraction of 40-45%. He has sleep apnea on CPAP. He did not maintain in sinus rhythm after cardioversion even with amiodarone and thus he is being treated with rate control. He has been doing extremely well and denies any chest pain or significant exertional dyspnea. No leg edema. No orthopnea or PND. No palpitations.   Past Medical History  Diagnosis Date  . Asthma   . Hyperlipidemia   . Pancreatitis 1980    due to ETOH  . Chronic systolic heart failure (HCC) 08/05/2012  . 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 Chronic systolic heart failure (HCC)     Due to nonischemic cardiomyopathy. Cardiac catheterization in September of 2013 showed no significant coronary artery disease with an ejection fraction of 35%. Normal filling pressures with only mild pulmonary hypertension.    Past Surgical History  Procedure Laterality Date  . Cystoscopy w/ ureteroscopy    . Cardiac catheterization  07/2012    [facility]  . Tee without cardioversion    . Cardioversion   09/15/12     Current Outpatient Prescriptions  Medication Sig Dispense Refill  . apixaban (ELIQUIS) 5 MG TABS tablet Take 1 tablet (5 mg total) by mouth 2 (two) times daily. 60 tablet 6  . buPROPion (WELLBUTRIN SR) 100 MG 12 hr tablet Take 100 mg by mouth daily.    . clonazePAM (KLONOPIN) 0.5 MG tablet Take 0.25 mg by mouth every morning. Take 1/2 tablet twice daily.    [name] DIGOX 125 MCG tablet TAKE 1 TABLET BY MOUTH DAILY. 90 tablet 3  . fluticasone (FLONASE) 50 MCG/ACT nasal spray Place 1 spray into both nostrils daily as needed.     . furosemide (LASIX) 20 MG tablet TAKE 1 TABLET BY MOUTH EVERY DAY 30 tablet 6  . levothyroxine (SYNTHROID, LEVOTHROID) 75 MCG tablet Take 75 mcg by mouth daily.    [name] losartan (COZAAR) 25 MG tablet Take 1 tablet (25 mg total) by mouth daily. 90 tablet 3  . metoprolol (LOPRESSOR) 100 MG tablet TAKE 1 TABLET BY MOUTH TWICE DAILY 180 tablet 0  . Plant Sterols and Stanols (CHOLEST OFF PO) Take by mouth daily.    [name] rOPINIRole (REQUIP) 0.5 MG tablet Take 0.5 mg by mouth as needed.  3  . sertraline (ZOLOFT) 100 MG tablet Take 100 mg by mouth daily.    . simvastatin (ZOCOR) 20 MG tablet Take 10 mg by mouth at bedtime.    [name]  VITAMIN D, ERGOCALCIFEROL, PO Take 400 Units by mouth daily.    [name] zolpidem (AMBIEN) 5 MG tablet Take 5 mg by mouth as needed.  2   No current facility-administered medications for this visit.    Allergies:   Review of patient's allergies indicates no known allergies.    Social History:  The patient  reports that he quit smoking about 7 years ago. His smoking use included Cigarettes. He has a 10 pack-year smoking history. He does not have any smokeless tobacco history on file. He reports that he does not drink alcohol or use illicit drugs.   Family History:  The patient's family history includes Arrhythmia in his sister; Hyperlipidemia in his brother.    ROS:  Please see the history of present illness.   Otherwise, review of systems are  positive for none.   All other systems are reviewed and negative.    PHYSICAL EXAM: VS:  BP 104/72 mmHg  Pulse 64  Ht 6' (1.829 m)  Wt 167 lb 8 oz (75.978 kg)  BMI 22.71 kg/m2 , BMI Body mass index is 22.71 kg/(m^2). GEN: Well nourished, well developed, in no acute distress HEENT: normal Neck: no JVD, carotid bruits, or masses Cardiac: Irregularly irregular; no murmurs, rubs, or gallops,no edema  Respiratory:  clear to auscultation bilaterally, normal work of breathing GI: soft, nontender, nondistended, + BS MS: no deformity or atrophy Skin: warm and dry, no rash Neuro:  Strength and sensation are intact Psych: euthymic mood, full affect   EKG:  EKG is ordered today. The ekg ordered today demonstrates atrial fibrillation with normal ventricular rate.   Recent Labs: No results found for requested labs within last 365 days.    Lipid Panel No results found for: CHOL, TRIG, HDL, CHOLHDL, VLDL, LDLCALC, LDLDIRECT    Wt Readings from Last 3 Encounters:  04/09/16 167 lb 8 oz (75.978 kg)  05/30/15 168 lb 8 oz (76.431 kg)  10/04/14 167 lb 6.4 oz (75.932 kg)        ASSESSMENT AND PLAN:  1.  Chronic atrial fibrillation: Ventricular rate is well controlled on metoprolol and small dose digoxin. He is tolerating anticoagulation with no side effects. His labs in November 2016  were reviewed and overall were unremarkable.  2. Chronic systolic heart failure: He has no symptoms of volume overload and currently uses furosemide as needed. Continue metoprolol and low-dose losartan. I will consider repeat echocardiogram next year.  3. Small PFO: no murmurs by physical exam. Continue to monitor.  4. Hyperlipidemia: Currently on simvastatin and being followed by his primary care physician.    Disposition:   FU with me in 9 months  Signed,  Lorine Bears, MD  04/09/2016 3:21 PM    [facility]

## 2016-04-09 NOTE — Patient Instructions (Signed)
Medication Instructions: Continue same medications.   Labwork: None.   Procedures/Testing: None.   Follow-Up: 9 months with Dr. Cedar Ditullio.   Any Additional Special Instructions Will Be Listed Below (If Applicable).     If you need a refill on your cardiac medications before your next appointment, please call your pharmacy.   

## 2016-04-11 DIAGNOSIS — H25042 Posterior subcapsular polar age-related cataract, left eye: Secondary | ICD-10-CM | POA: Diagnosis not present

## 2016-04-12 ENCOUNTER — Encounter: Payer: Self-pay | Admitting: *Deleted

## 2016-04-12 NOTE — Pre-Procedure Instructions (Signed)
SPOKE WITH PATIENT TO VERIFY HIS LIST OF CURRENT MEDICATIONS

## 2016-04-17 ENCOUNTER — Ambulatory Visit: Payer: Medicare Other | Admitting: Anesthesiology

## 2016-04-17 ENCOUNTER — Ambulatory Visit
Admission: RE | Admit: 2016-04-17 | Discharge: 2016-04-17 | Disposition: A | Discharge status: Home / Self Care | Payer: Medicare Other | Source: Ambulatory Visit | Attending: Ophthalmology | Admitting: Ophthalmology

## 2016-04-17 ENCOUNTER — Encounter: Payer: Self-pay | Admitting: *Deleted

## 2016-04-17 ENCOUNTER — Encounter
Admission: RE | Disposition: A | Discharge status: Home / Self Care | Payer: Self-pay | Source: Ambulatory Visit | Attending: Ophthalmology

## 2016-04-17 DIAGNOSIS — E039 Hypothyroidism, unspecified: Secondary | ICD-10-CM | POA: Insufficient documentation

## 2016-04-17 DIAGNOSIS — Z87891 Personal history of nicotine dependence: Secondary | ICD-10-CM | POA: Diagnosis not present

## 2016-04-17 DIAGNOSIS — G4730 Sleep apnea, unspecified: Secondary | ICD-10-CM | POA: Insufficient documentation

## 2016-04-17 DIAGNOSIS — I5022 Chronic systolic (congestive) heart failure: Secondary | ICD-10-CM | POA: Diagnosis not present

## 2016-04-17 DIAGNOSIS — K219 Gastro-esophageal reflux disease without esophagitis: Secondary | ICD-10-CM | POA: Diagnosis not present

## 2016-04-17 DIAGNOSIS — I2510 Atherosclerotic heart disease of native coronary artery without angina pectoris: Secondary | ICD-10-CM | POA: Insufficient documentation

## 2016-04-17 DIAGNOSIS — H2512 Age-related nuclear cataract, left eye: Secondary | ICD-10-CM | POA: Insufficient documentation

## 2016-04-17 DIAGNOSIS — Z8719 Personal history of other diseases of the digestive system: Secondary | ICD-10-CM | POA: Insufficient documentation

## 2016-04-17 DIAGNOSIS — Z79899 Other long term (current) drug therapy: Secondary | ICD-10-CM | POA: Insufficient documentation

## 2016-04-17 DIAGNOSIS — E785 Hyperlipidemia, unspecified: Secondary | ICD-10-CM | POA: Insufficient documentation

## 2016-04-17 DIAGNOSIS — I509 Heart failure, unspecified: Secondary | ICD-10-CM | POA: Diagnosis not present

## 2016-04-17 DIAGNOSIS — F419 Anxiety disorder, unspecified: Secondary | ICD-10-CM | POA: Insufficient documentation

## 2016-04-17 DIAGNOSIS — J45909 Unspecified asthma, uncomplicated: Secondary | ICD-10-CM | POA: Insufficient documentation

## 2016-04-17 DIAGNOSIS — H25042 Posterior subcapsular polar age-related cataract, left eye: Secondary | ICD-10-CM | POA: Diagnosis not present

## 2016-04-17 HISTORY — DX: Gastro-esophageal reflux disease without esophagitis: K21.9

## 2016-04-17 HISTORY — PX: CATARACT EXTRACTION W/PHACO: SHX586

## 2016-04-17 HISTORY — DX: Hypothyroidism, unspecified: E03.9

## 2016-04-17 HISTORY — DX: Sleep apnea, unspecified: G47.30

## 2016-04-17 HISTORY — DX: Palpitations: R00.2

## 2016-04-17 HISTORY — DX: Panic disorder (episodic paroxysmal anxiety): F41.0

## 2016-04-17 SURGERY — PHACOEMULSIFICATION, CATARACT, WITH IOL INSERTION
Anesthesia: Monitor Anesthesia Care | Site: Eye | Laterality: Left | Wound class: Clean

## 2016-04-17 MED ORDER — MOXIFLOXACIN HCL 0.5 % OP SOLN
OPHTHALMIC | Status: AC
  Filled 2016-04-17: qty 3

## 2016-04-17 MED ORDER — NA CHONDROIT SULF-NA HYALURON 40-17 MG/ML IO SOLN
INTRAOCULAR | Status: AC
  Filled 2016-04-17: qty 1

## 2016-04-17 MED ORDER — ARMC OPHTHALMIC DILATING GEL
OPHTHALMIC | Status: AC
  Administered 2016-04-17: 1 via OPHTHALMIC
  Filled 2016-04-17: qty 0.25

## 2016-04-17 MED ORDER — MIDAZOLAM HCL 2 MG/2ML IJ SOLN
INTRAMUSCULAR | Status: DC | PRN
  Administered 2016-04-17: 1 mg via INTRAVENOUS

## 2016-04-17 MED ORDER — POVIDONE-IODINE 5 % OP SOLN
1.0000 "application " | Freq: Once | OPHTHALMIC | Status: AC
  Administered 2016-04-17: 1 via OPHTHALMIC

## 2016-04-17 MED ORDER — CARBACHOL 0.01 % IO SOLN
INTRAOCULAR | Status: DC | PRN
  Administered 2016-04-17: 0.5 mL via INTRAOCULAR

## 2016-04-17 MED ORDER — ARMC OPHTHALMIC DILATING GEL
1.0000 "application " | OPHTHALMIC | Status: AC | PRN
  Administered 2016-04-17 (×2): 1 via OPHTHALMIC

## 2016-04-17 MED ORDER — SODIUM CHLORIDE 0.9 % IV SOLN
INTRAVENOUS | Status: DC
  Administered 2016-04-17: 11:00:00 via INTRAVENOUS

## 2016-04-17 MED ORDER — MOXIFLOXACIN HCL 0.5 % OP SOLN: 1.0000 [drp] | OPHTHALMIC | Status: DC | PRN

## 2016-04-17 MED ORDER — POVIDONE-IODINE 5 % OP SOLN
OPHTHALMIC | Status: AC
  Filled 2016-04-17: qty 30

## 2016-04-17 MED ORDER — EPINEPHRINE HCL 1 MG/ML IJ SOLN
INTRAOCULAR | Status: DC | PRN
  Administered 2016-04-17: 12:00:00 via OPHTHALMIC

## 2016-04-17 MED ORDER — NA CHONDROIT SULF-NA HYALURON 40-17 MG/ML IO SOLN
INTRAOCULAR | Status: DC | PRN
  Administered 2016-04-17: 1 mL via INTRAOCULAR

## 2016-04-17 MED ORDER — MOXIFLOXACIN HCL 0.5 % OP SOLN
OPHTHALMIC | Status: DC | PRN
  Administered 2016-04-17: 1 [drp] via OPHTHALMIC

## 2016-04-17 MED ORDER — CEFUROXIME OPHTHALMIC INJECTION 1 MG/0.1 ML
INJECTION | OPHTHALMIC | Status: DC | PRN
  Administered 2016-04-17: 0.1 mL via INTRACAMERAL

## 2016-04-17 MED ORDER — TETRACAINE HCL 0.5 % OP SOLN
OPHTHALMIC | Status: AC
  Filled 2016-04-17: qty 2

## 2016-04-17 MED ORDER — EPINEPHRINE HCL 1 MG/ML IJ SOLN
INTRAMUSCULAR | Status: AC
  Filled 2016-04-17: qty 1

## 2016-04-17 MED ORDER — CEFUROXIME OPHTHALMIC INJECTION 1 MG/0.1 ML
INJECTION | OPHTHALMIC | Status: AC
  Filled 2016-04-17: qty 0.1

## 2016-04-17 MED ORDER — TETRACAINE HCL 0.5 % OP SOLN
1.0000 [drp] | Freq: Once | OPHTHALMIC | Status: AC
  Administered 2016-04-17: 1 [drp] via OPHTHALMIC

## 2016-04-17 MED ORDER — FENTANYL CITRATE (PF) 100 MCG/2ML IJ SOLN
INTRAMUSCULAR | Status: DC | PRN
  Administered 2016-04-17: 50 ug via INTRAVENOUS

## 2016-04-17 SURGICAL SUPPLY — 21 items
CANNULA ANT/CHMB 27GA (MISCELLANEOUS) ×3 IMPLANT
CUP MEDICINE 2OZ PLAST GRAD ST (MISCELLANEOUS) ×3 IMPLANT
GLOVE BIO SURGEON STRL SZ8 (GLOVE) ×3 IMPLANT
GLOVE BIOGEL M 6.5 STRL (GLOVE) ×3 IMPLANT
GLOVE SURG LX 8.0 MICRO (GLOVE) ×2
GLOVE SURG LX STRL 8.0 MICRO (GLOVE) ×1 IMPLANT
GOWN STRL REUS W/ TWL LRG LVL3 (GOWN DISPOSABLE) ×2 IMPLANT
GOWN STRL REUS W/TWL LRG LVL3 (GOWN DISPOSABLE) ×4
LENS IOL TECNIS SYMFONY 20.0 (Intraocular Lens) ×3 IMPLANT
PACK CATARACT (MISCELLANEOUS) ×3 IMPLANT
PACK CATARACT BRASINGTON LX (MISCELLANEOUS) ×3 IMPLANT
PACK EYE AFTER SURG (MISCELLANEOUS) ×3 IMPLANT
SOL BSS BAG (MISCELLANEOUS) ×3
SOL PREP PVP 2OZ (MISCELLANEOUS) ×3
SOLUTION BSS BAG (MISCELLANEOUS) ×1 IMPLANT
SOLUTION PREP PVP 2OZ (MISCELLANEOUS) ×1 IMPLANT
SYR 3ML LL SCALE MARK (SYRINGE) ×3 IMPLANT
SYR 5ML LL (SYRINGE) ×3 IMPLANT
SYR TB 1ML 27GX1/2 LL (SYRINGE) ×3 IMPLANT
WATER STERILE IRR 1000ML POUR (IV SOLUTION) ×3 IMPLANT
WIPE NON LINTING 3.25X3.25 (MISCELLANEOUS) ×3 IMPLANT

## 2016-04-17 NOTE — Transfer of Care (Signed)
Immediate Anesthesia Transfer of Care Note  Patient: Jeffery Shaw  Procedure(s) Performed: Procedure(s) with comments: CATARACT EXTRACTION PHACO AND INTRAOCULAR LENS PLACEMENT (IOC) (Left) - [place] 32.9 AP% 20.1 CDE 6.59 FLUID PACK LOT # [id number]  Patient Location: PACU  Anesthesia Type:MAC  Level of Consciousness: awake, alert  and oriented  Airway & Oxygen Therapy: Patient Spontanous Breathing  Post-op Assessment: Report given to RN and Post -op Vital signs reviewed and stable  Post vital signs: Reviewed and stable  Last Vitals:  Filed Vitals:   04/17/16 1031  BP: 118/92  Pulse: 97  Temp: 36.7 C  Resp: 18    Last Pain: There were no vitals filed for this visit.       Complications: No apparent anesthesia complications

## 2016-04-17 NOTE — Op Note (Signed)
PREOPERATIVE DIAGNOSIS:  Nuclear sclerotic cataract of the left eye.   POSTOPERATIVE DIAGNOSIS:  NUCLEAR SCLEROTIC CATARACT LEFT EYE   OPERATIVE PROCEDURE:  Procedure(s): CATARACT EXTRACTION PHACO AND INTRAOCULAR LENS PLACEMENT (IOC)   SURGEON:  Galen Manila, MD.   ANESTHESIA:   Anesthesiologist: Rosaria Ferries, MD CRNA: Evelena Peat, CRNA; Michaele Offer, CRNA  1.      Managed anesthesia care. 2.      Topical tetracaine drops followed by 2% Xylocaine jelly applied in the preoperative holding area.   COMPLICATIONS:  None.   TECHNIQUE:   Stop and chop   DESCRIPTION OF PROCEDURE:  The patient was examined and consented in the preoperative holding area where the aforementioned topical anesthesia was applied to the left eye and then brought back to the Operating Room where the left eye was prepped and draped in the usual sterile ophthalmic fashion and a lid speculum was placed. A paracentesis was created with the side port blade and the anterior chamber was filled with viscoelastic. A near clear corneal incision was performed with the steel keratome. A continuous curvilinear capsulorrhexis was performed with a cystotome followed by the capsulorrhexis forceps. Hydrodissection and hydrodelineation were carried out with BSS on a blunt cannula. The lens was removed in a stop and chop  technique and the remaining cortical material was removed with the irrigation-aspiration handpiece. The capsular bag was inflated with viscoelastic and the Technis [id number] lens was placed in the capsular bag without complication. The remaining viscoelastic was removed from the eye with the irrigation-aspiration handpiece. The wounds were hydrated. The anterior chamber was flushed with Miostat and the eye was inflated to physiologic pressure. 0.1 mL of cefuroxime concentration 10 mg/mL was placed in the anterior chamber. The wounds were found to be water tight. The eye was dressed with Vigamox. The patient  was given protective glasses to wear throughout the day and a shield with which to sleep tonight. The patient was also given drops with which to begin a drop regimen today and will follow-up with me in one day.  Implant Name Type Inv. Item Serial No. Manufacturer Lot No. LRB No. Used  LENS IOL SYMFONY 20.0 - [id number] Intraocular Lens LENS IOL SYMFONY 20.0 [id number]   Left 1   Procedure(s) with comments: CATARACT EXTRACTION PHACO AND INTRAOCULAR LENS PLACEMENT (IOC) (Left) - [place] 32.9 AP% 20.1 CDE 6.59 FLUID PACK LOT # [id number]  Electronically signed: [place] Basham LOUIS 04/17/2016 11:54 AM

## 2016-04-17 NOTE — Anesthesia Preprocedure Evaluation (Signed)
Anesthesia Evaluation  Patient identified by MRN, date of birth, ID band Patient awake    Reviewed: Allergy & Precautions, H&P , NPO status , Patient's Chart, lab work & pertinent test results  History of Anesthesia Complications Negative for: history of anesthetic complications  Airway Mallampati: III  TM Distance: <3 FB Neck ROM: limited    Dental  (+) Poor Dentition, Chipped   Pulmonary shortness of breath and with exertion, asthma , sleep apnea , former smoker,    Pulmonary exam normal breath sounds clear to auscultation       Cardiovascular Exercise Tolerance: Poor +CHF  (-) Past MI Normal cardiovascular exam+ dysrhythmias Atrial Fibrillation  Rhythm:regular Rate:Normal     Neuro/Psych PSYCHIATRIC DISORDERS Anxiety negative neurological ROS     GI/Hepatic Neg liver ROS, GERD  Controlled,  Endo/Other  Hypothyroidism   Renal/GU negative Renal ROS  negative genitourinary   Musculoskeletal   Abdominal   Peds  Hematology negative hematology ROS (+)   Anesthesia Other Findings Past Medical History:   Asthma                                                       Hyperlipidemia                                               Pancreatitis                                    1980           Comment:due to ETOH   Chronic systolic heart failure (HCC)            08/05/2012    PFO (patent foramen ovale)                                     Comment:Tunnelled PFO with a small left-to-right atrial              shunt noted on TEE with a QP/QS ratio of 1.37               by cardiac cath   Chronic systolic heart failure (HCC)                           Comment:Due to nonischemic cardiomyopathy. Cardiac               catheterization in September of 2013 showed no               significant coronary artery disease with an               ejection fraction of 35%. Normal filling               pressures with only mild pulmonary        hypertension.   Dysrhythmia  Comment:AFIB   Sleep apnea                                                    Comment:CPAP   Shortness of breath dyspnea                                    Comment:WITH EXERTION   GERD (gastroesophageal reflux disease)                       Hypothyroidism                                               Panic attacks                                                Palpitations                                                Past Surgical History:   CYSTOSCOPY W/ URETEROSCOPY                                    CARDIAC CATHETERIZATION                          07/2012         Comment:[facility]   TEE WITHOUT CARDIOVERSION                                     CARDIOVERSION                                    09/15/12    BMI    Body Mass Index   21.69 kg/m 2      Reproductive/Obstetrics negative OB ROS                             Anesthesia Physical Anesthesia Plan  ASA: IV  Anesthesia Plan: MAC   Post-op Pain Management:    Induction:   Airway Management Planned:   Additional Equipment:   Intra-op Plan:   Post-operative Plan:   Informed Consent: I have reviewed the patients History and Physical, chart, labs and discussed the procedure including the risks, benefits and alternatives for the proposed anesthesia with the patient or authorized representative who has indicated his/her understanding and acceptance.   Dental Advisory Given  Plan Discussed with: Anesthesiologist, CRNA and Surgeon  Anesthesia Plan Comments:         Anesthesia Quick Evaluation

## 2016-04-17 NOTE — Anesthesia Postprocedure Evaluation (Signed)
Anesthesia Post Note  Patient: Jeffery Shaw  Procedure(s) Performed: Procedure(s) (LRB): CATARACT EXTRACTION PHACO AND INTRAOCULAR LENS PLACEMENT (IOC) (Left)  Patient location during evaluation: PACU Anesthesia Type: MAC Level of consciousness: awake and alert Pain management: pain level controlled Vital Signs Assessment: post-procedure vital signs reviewed and stable Respiratory status: spontaneous breathing, nonlabored ventilation, respiratory function stable and patient connected to nasal cannula oxygen Cardiovascular status: stable and blood pressure returned to baseline Anesthetic complications: no    Last Vitals:  Filed Vitals:   04/17/16 1031 04/17/16 1157  BP: 118/92 113/68  Pulse: 97 60  Temp: 36.7 C 36.6 C  Resp: 18 18    Last Pain: There were no vitals filed for this visit.               Cleda Mccreedy Piscitello

## 2016-04-17 NOTE — Discharge Instructions (Signed)
Eye Surgery Discharge Instructions  Expect mild scratchy sensation or mild soreness. DO NOT RUB YOUR EYE!  The day of surgery:  Minimal physical activity, but bed rest is not required  No reading, computer work, or close hand work  No bending, lifting, or straining.  May watch TV  For 24 hours:  No driving, legal decisions, or alcoholic beverages  Safety precautions  Eat anything you prefer: It is better to start with liquids, then soup then solid foods.  _____ Eye patch should be worn until postoperative exam tomorrow.  ____ Solar shield eyeglasses should be worn for comfort in the sunlight/patch while sleeping  Resume all regular medications including aspirin or Coumadin if these were discontinued prior to surgery. You may shower, bathe, shave, or wash your hair. Tylenol may be taken for mild discomfort.  Call your doctor if you experience significant pain, nausea, or vomiting, fever > 101 or other signs of infection. [phone] or [phone] Specific instructions:  Follow-up Information    Follow up with PORFILIO,WILLIAM LOUIS, MD In 1 day.   Specialty:  Ophthalmology   Why:  May 31 at 10:25am   Contact information:   [address] [place] [address] [phone]

## 2016-05-04 DIAGNOSIS — F3341 Major depressive disorder, recurrent, in partial remission: Secondary | ICD-10-CM | POA: Diagnosis not present

## 2016-05-10 DIAGNOSIS — H2511 Age-related nuclear cataract, right eye: Secondary | ICD-10-CM | POA: Diagnosis not present

## 2016-05-11 DIAGNOSIS — J301 Allergic rhinitis due to pollen: Secondary | ICD-10-CM | POA: Diagnosis not present

## 2016-05-14 DIAGNOSIS — J301 Allergic rhinitis due to pollen: Secondary | ICD-10-CM | POA: Diagnosis not present

## 2016-05-15 ENCOUNTER — Encounter: Payer: Self-pay | Admitting: *Deleted

## 2016-05-21 ENCOUNTER — Encounter
Admission: RE | Disposition: A | Discharge status: Home / Self Care | Payer: Self-pay | Source: Ambulatory Visit | Attending: Ophthalmology

## 2016-05-21 ENCOUNTER — Encounter: Payer: Self-pay | Admitting: *Deleted

## 2016-05-21 ENCOUNTER — Ambulatory Visit: Payer: Medicare Other | Admitting: Anesthesiology

## 2016-05-21 ENCOUNTER — Ambulatory Visit
Admission: RE | Admit: 2016-05-21 | Discharge: 2016-05-21 | Disposition: A | Discharge status: Home / Self Care | Payer: Medicare Other | Source: Ambulatory Visit | Attending: Ophthalmology | Admitting: Ophthalmology

## 2016-05-21 DIAGNOSIS — I509 Heart failure, unspecified: Secondary | ICD-10-CM | POA: Insufficient documentation

## 2016-05-21 DIAGNOSIS — R0602 Shortness of breath: Secondary | ICD-10-CM | POA: Diagnosis not present

## 2016-05-21 DIAGNOSIS — H2511 Age-related nuclear cataract, right eye: Secondary | ICD-10-CM | POA: Diagnosis not present

## 2016-05-21 DIAGNOSIS — Z87891 Personal history of nicotine dependence: Secondary | ICD-10-CM | POA: Diagnosis not present

## 2016-05-21 DIAGNOSIS — I2510 Atherosclerotic heart disease of native coronary artery without angina pectoris: Secondary | ICD-10-CM | POA: Diagnosis not present

## 2016-05-21 DIAGNOSIS — J449 Chronic obstructive pulmonary disease, unspecified: Secondary | ICD-10-CM | POA: Diagnosis not present

## 2016-05-21 DIAGNOSIS — E785 Hyperlipidemia, unspecified: Secondary | ICD-10-CM | POA: Insufficient documentation

## 2016-05-21 DIAGNOSIS — K219 Gastro-esophageal reflux disease without esophagitis: Secondary | ICD-10-CM | POA: Insufficient documentation

## 2016-05-21 DIAGNOSIS — Z8719 Personal history of other diseases of the digestive system: Secondary | ICD-10-CM | POA: Insufficient documentation

## 2016-05-21 DIAGNOSIS — F419 Anxiety disorder, unspecified: Secondary | ICD-10-CM | POA: Insufficient documentation

## 2016-05-21 DIAGNOSIS — E039 Hypothyroidism, unspecified: Secondary | ICD-10-CM | POA: Insufficient documentation

## 2016-05-21 DIAGNOSIS — G4730 Sleep apnea, unspecified: Secondary | ICD-10-CM | POA: Insufficient documentation

## 2016-05-21 DIAGNOSIS — J45909 Unspecified asthma, uncomplicated: Secondary | ICD-10-CM | POA: Diagnosis not present

## 2016-05-21 HISTORY — DX: Emphysema, unspecified: J43.9

## 2016-05-21 HISTORY — PX: CATARACT EXTRACTION W/PHACO: SHX586

## 2016-05-21 SURGERY — PHACOEMULSIFICATION, CATARACT, WITH IOL INSERTION
Anesthesia: Monitor Anesthesia Care | Site: Eye | Laterality: Right | Wound class: Clean

## 2016-05-21 MED ORDER — MOXIFLOXACIN HCL 0.5 % OP SOLN: 1.0000 [drp] | OPHTHALMIC | Status: DC | PRN

## 2016-05-21 MED ORDER — MIDAZOLAM HCL 2 MG/2ML IJ SOLN
INTRAMUSCULAR | Status: DC | PRN
  Administered 2016-05-21: 1 mg via INTRAVENOUS

## 2016-05-21 MED ORDER — NA CHONDROIT SULF-NA HYALURON 40-17 MG/ML IO SOLN
INTRAOCULAR | Status: AC
  Filled 2016-05-21: qty 1

## 2016-05-21 MED ORDER — EPINEPHRINE HCL 1 MG/ML IJ SOLN
INTRAOCULAR | Status: DC | PRN
  Administered 2016-05-21: 10:00:00 via OPHTHALMIC

## 2016-05-21 MED ORDER — TETRACAINE HCL 0.5 % OP SOLN
OPHTHALMIC | Status: AC
  Filled 2016-05-21: qty 2

## 2016-05-21 MED ORDER — MOXIFLOXACIN HCL 0.5 % OP SOLN
OPHTHALMIC | Status: AC
  Filled 2016-05-21: qty 3

## 2016-05-21 MED ORDER — CARBACHOL 0.01 % IO SOLN
INTRAOCULAR | Status: DC | PRN
  Administered 2016-05-21: 0.5 mL via INTRAOCULAR

## 2016-05-21 MED ORDER — NA CHONDROIT SULF-NA HYALURON 40-17 MG/ML IO SOLN
INTRAOCULAR | Status: DC | PRN
  Administered 2016-05-21: 1 mL via INTRAOCULAR

## 2016-05-21 MED ORDER — LIDOCAINE HCL (PF) 1 % IJ SOLN
INTRAMUSCULAR | Status: AC
  Filled 2016-05-21: qty 2

## 2016-05-21 MED ORDER — SODIUM CHLORIDE 0.9 % IV SOLN
INTRAVENOUS | Status: DC | PRN
  Administered 2016-05-21: 10:00:00 via INTRAVENOUS

## 2016-05-21 MED ORDER — FENTANYL CITRATE (PF) 100 MCG/2ML IJ SOLN
INTRAMUSCULAR | Status: DC | PRN
  Administered 2016-05-21: 50 ug via INTRAVENOUS

## 2016-05-21 MED ORDER — ARMC OPHTHALMIC DILATING GEL
1.0000 | OPHTHALMIC | Status: AC | PRN
Start: 2016-05-21 — End: 2016-05-21
  Administered 2016-05-21 (×2): 1 via OPHTHALMIC

## 2016-05-21 MED ORDER — POVIDONE-IODINE 5 % OP SOLN
OPHTHALMIC | Status: AC
  Administered 2016-05-21: 1 via OPHTHALMIC
  Filled 2016-05-21: qty 30

## 2016-05-21 MED ORDER — TETRACAINE HCL 0.5 % OP SOLN
1.0000 [drp] | Freq: Once | OPHTHALMIC | Status: AC
  Administered 2016-05-21: 1 [drp] via OPHTHALMIC

## 2016-05-21 MED ORDER — POVIDONE-IODINE 5 % OP SOLN
1.0000 "application " | Freq: Once | OPHTHALMIC | Status: AC
  Administered 2016-05-21: 1 via OPHTHALMIC

## 2016-05-21 MED ORDER — CEFUROXIME OPHTHALMIC INJECTION 1 MG/0.1 ML
INJECTION | OPHTHALMIC | Status: DC | PRN
  Administered 2016-05-21: 0.1 mL via INTRACAMERAL

## 2016-05-21 MED ORDER — EPINEPHRINE HCL 1 MG/ML IJ SOLN
INTRAMUSCULAR | Status: AC
  Filled 2016-05-21: qty 1

## 2016-05-21 MED ORDER — MOXIFLOXACIN HCL 0.5 % OP SOLN
OPHTHALMIC | Status: DC | PRN
  Administered 2016-05-21: 1 [drp] via OPHTHALMIC

## 2016-05-21 MED ORDER — CEFUROXIME OPHTHALMIC INJECTION 1 MG/0.1 ML
INJECTION | OPHTHALMIC | Status: AC
  Filled 2016-05-21: qty 0.1

## 2016-05-21 MED ORDER — ARMC OPHTHALMIC DILATING GEL
OPHTHALMIC | Status: AC
  Administered 2016-05-21: 1 via OPHTHALMIC
  Filled 2016-05-21: qty 0.25

## 2016-05-21 MED ORDER — SODIUM CHLORIDE 0.9 % IV SOLN
INTRAVENOUS | Status: DC
  Administered 2016-05-21: 09:00:00 via INTRAVENOUS

## 2016-05-21 SURGICAL SUPPLY — 21 items
CANNULA ANT/CHMB 27GA (MISCELLANEOUS) ×3 IMPLANT
CUP MEDICINE 2OZ PLAST GRAD ST (MISCELLANEOUS) ×3 IMPLANT
GLOVE BIO SURGEON STRL SZ8 (GLOVE) ×3 IMPLANT
GLOVE BIOGEL M 6.5 STRL (GLOVE) ×3 IMPLANT
GLOVE SURG LX 8.0 MICRO (GLOVE) ×2
GLOVE SURG LX STRL 8.0 MICRO (GLOVE) ×1 IMPLANT
GOWN STRL REUS W/ TWL LRG LVL3 (GOWN DISPOSABLE) ×2 IMPLANT
GOWN STRL REUS W/TWL LRG LVL3 (GOWN DISPOSABLE) ×4
LENS IOL TECNIS SYMFONY 19.5 (Intraocular Lens) ×3 IMPLANT
PACK CATARACT (MISCELLANEOUS) ×3 IMPLANT
PACK CATARACT BRASINGTON LX (MISCELLANEOUS) ×3 IMPLANT
PACK EYE AFTER SURG (MISCELLANEOUS) ×3 IMPLANT
SOL BSS BAG (MISCELLANEOUS) ×3
SOL PREP PVP 2OZ (MISCELLANEOUS) ×3
SOLUTION BSS BAG (MISCELLANEOUS) ×1 IMPLANT
SOLUTION PREP PVP 2OZ (MISCELLANEOUS) ×1 IMPLANT
SYR 3ML LL SCALE MARK (SYRINGE) ×3 IMPLANT
SYR 5ML LL (SYRINGE) ×3 IMPLANT
SYR TB 1ML 27GX1/2 LL (SYRINGE) ×3 IMPLANT
WATER STERILE IRR 1000ML POUR (IV SOLUTION) ×3 IMPLANT
WIPE NON LINTING 3.25X3.25 (MISCELLANEOUS) ×3 IMPLANT

## 2016-05-21 NOTE — Discharge Instructions (Signed)
AMBULATORY SURGERY  °DISCHARGE INSTRUCTIONS ° ° °1) The drugs that you were given will stay in your system until tomorrow so for the next 24 hours you should not: ° °A) Drive an automobile °B) Make any legal decisions °C) Drink any alcoholic beverage ° ° °2) You may resume regular meals tomorrow.  Today it is better to start with liquids and gradually work up to solid foods. ° °You may eat anything you prefer, but it is better to start with liquids, then soup and crackers, and gradually work up to solid foods. ° ° °3) Please notify your doctor immediately if you have any unusual bleeding, trouble breathing, redness and pain at the surgery site, drainage, fever, or pain not relieved by medication. ° ° ° °4) Additional Instructions: ° ° ° ° ° ° ° °Please contact your physician with any problems or Same Day Surgery at [phone], Monday through Friday 6 am to 4 pm, or [place] at [place] Main number at [phone].AMBULATORY SURGERY  °DISCHARGE INSTRUCTIONS ° ° °5) The drugs that you were given will stay in your system until tomorrow so for the next 24 hours you should not: ° °D) Drive an automobile °E) Make any legal decisions °F) Drink any alcoholic beverage ° ° °6) You may resume regular meals tomorrow.  Today it is better to start with liquids and gradually work up to solid foods. ° °You may eat anything you prefer, but it is better to start with liquids, then soup and crackers, and gradually work up to solid foods. ° ° °7) Please notify your doctor immediately if you have any unusual bleeding, trouble breathing, redness and pain at the surgery site, drainage, fever, or pain not relieved by medication. ° ° ° °8) Additional Instructions: ° ° ° ° ° ° ° °Please contact your physician with any problems or Same Day Surgery at [phone], Monday through Friday 6 am to 4 pm, or [place] [facility] [place] Main number at [phone]. °

## 2016-05-21 NOTE — Anesthesia Preprocedure Evaluation (Signed)
Anesthesia Evaluation  Patient identified by MRN, date of birth, ID band Patient awake    Reviewed: Allergy & Precautions, H&P , NPO status , Patient's Chart, lab work & pertinent test results  History of Anesthesia Complications Negative for: history of anesthetic complications  Airway Mallampati: III  TM Distance: <3 FB Neck ROM: limited    Dental  (+) Poor Dentition, Chipped   Pulmonary shortness of breath and with exertion, asthma , sleep apnea , COPD, former smoker,    Pulmonary exam normal breath sounds clear to auscultation       Cardiovascular Exercise Tolerance: Poor +CHF  (-) Past MI Normal cardiovascular exam+ dysrhythmias Atrial Fibrillation  Rhythm:regular Rate:Normal     Neuro/Psych PSYCHIATRIC DISORDERS Anxiety negative neurological ROS     GI/Hepatic Neg liver ROS, GERD  Controlled,  Endo/Other  Hypothyroidism   Renal/GU negative Renal ROS  negative genitourinary   Musculoskeletal   Abdominal   Peds  Hematology negative hematology ROS (+)   Anesthesia Other Findings Past Medical History:   Asthma                                                       Hyperlipidemia                                               Pancreatitis                                    1980           Comment:due to ETOH   Chronic systolic heart failure (HCC)            08/05/2012    PFO (patent foramen ovale)                                     Comment:Tunnelled PFO with a small left-to-right atrial              shunt noted on TEE with a QP/QS ratio of 1.37               by cardiac cath   Chronic systolic heart failure (HCC)                           Comment:Due to nonischemic cardiomyopathy. Cardiac               catheterization in September of 2013 showed no               significant coronary artery disease with an               ejection fraction of 35%. Normal filling               pressures with only mild pulmonary              hypertension.   Dysrhythmia  Comment:AFIB   Sleep apnea                                                    Comment:CPAP   Shortness of breath dyspnea                                    Comment:WITH EXERTION   GERD (gastroesophageal reflux disease)                       Hypothyroidism                                               Panic attacks                                                Palpitations                                                Past Surgical History:   CYSTOSCOPY W/ URETEROSCOPY                                    CARDIAC CATHETERIZATION                          07/2012         Comment:[facility]   TEE WITHOUT CARDIOVERSION                                     CARDIOVERSION                                    09/15/12    BMI    Body Mass Index   21.69 kg/m 2      Reproductive/Obstetrics negative OB ROS                             Anesthesia Physical  Anesthesia Plan  ASA: IV  Anesthesia Plan: MAC   Post-op Pain Management:    Induction:   Airway Management Planned:   Additional Equipment:   Intra-op Plan:   Post-operative Plan:   Informed Consent: I have reviewed the patients History and Physical, chart, labs and discussed the procedure including the risks, benefits and alternatives for the proposed anesthesia with the patient or authorized representative who has indicated his/her understanding and acceptance.   Dental Advisory Given  Plan Discussed with: Anesthesiologist, CRNA and Surgeon  Anesthesia Plan Comments:         Anesthesia Quick Evaluation

## 2016-05-21 NOTE — Anesthesia Procedure Notes (Signed)
Performed by: Lovinia Snare Oxygen Delivery Method: Nasal cannula     

## 2016-05-21 NOTE — Anesthesia Postprocedure Evaluation (Signed)
Anesthesia Post Note  Patient: BRYAR CONWAY  Procedure(s) Performed: Procedure(s) (LRB): CATARACT EXTRACTION PHACO AND INTRAOCULAR LENS PLACEMENT (IOC) (Right)  Patient location during evaluation: PACU Anesthesia Type: MAC Level of consciousness: awake and alert Pain management: pain level controlled Vital Signs Assessment: post-procedure vital signs reviewed and stable Respiratory status: spontaneous breathing, nonlabored ventilation, respiratory function stable and patient connected to nasal cannula oxygen Cardiovascular status: blood pressure returned to baseline and stable Postop Assessment: no signs of nausea or vomiting Anesthetic complications: no    Last Vitals:  Filed Vitals:   05/21/16 1039 05/21/16 1051  BP: 105/6 98/65  Pulse: 58 64  Temp: 35.9 C   Resp: 16     Last Pain: There were no vitals filed for this visit.               Lenard Simmer

## 2016-05-21 NOTE — Transfer of Care (Signed)
Immediate Anesthesia Transfer of Care Note  Patient: Jeffery Shaw  Procedure(s) Performed: Procedure(s) with comments: CATARACT EXTRACTION PHACO AND INTRAOCULAR LENS PLACEMENT (IOC) (Right) - [place] 31.0 AP% 20.4 CDE 6.30 Fluid Pck Lot # [id number]  Patient Location: PACU  Anesthesia Type:MAC  Level of Consciousness: awake, alert  and oriented  Airway & Oxygen Therapy: Patient Spontanous Breathing  Post-op Assessment: Report given to RN  Post vital signs: Reviewed and stable  Last Vitals:  Filed Vitals:   05/21/16 0905 05/21/16 1033  BP: 112/63 117/74  Pulse: 58 65  Temp: 37 C 37 C  Resp: 16 16    Last Pain: There were no vitals filed for this visit.       Complications: No apparent anesthesia complications

## 2016-05-21 NOTE — H&P (Signed)
  All labs reviewed. Abnormal studies sent to patients PCP when indicated.  Previous H&P reviewed, patient examined, there are NO CHANGES.  Jeffery Shaw LOUIS7/3/201710:11 AM

## 2016-05-21 NOTE — Op Note (Signed)
PREOPERATIVE DIAGNOSIS:  Nuclear sclerotic cataract of the right eye.   POSTOPERATIVE DIAGNOSIS: NUCLEAR SCLEROTIC CATARACT RIGHT EYE   OPERATIVE PROCEDURE:  Procedure(s): CATARACT EXTRACTION PHACO AND INTRAOCULAR LENS PLACEMENT (IOC)   SURGEON:  Galen Manila, MD.   ANESTHESIA:  Anesthesiologist: Lenard Simmer, MD CRNA: Eduardo Osier, CRNA  1.      Managed anesthesia care. 2.      Topical tetracaine drops followed by 2% Xylocaine jelly applied in the preoperative holding area.   COMPLICATIONS:  None.   TECHNIQUE:   Stop and chop   DESCRIPTION OF PROCEDURE:  The patient was examined and consented in the preoperative holding area where the aforementioned topical anesthesia was applied to the right eye and then brought back to the Operating Room where the right eye was prepped and draped in the usual sterile ophthalmic fashion and a lid speculum was placed. A paracentesis was created with the side port blade and the anterior chamber was filled with viscoelastic. A near clear corneal incision was performed with the steel keratome. A continuous curvilinear capsulorrhexis was performed with a cystotome followed by the capsulorrhexis forceps. Hydrodissection and hydrodelineation were carried out with BSS on a blunt cannula. The lens was removed in a stop and chop  technique and the remaining cortical material was removed with the irrigation-aspiration handpiece. The capsular bag was inflated with viscoelastic and the Technis [id number]  lens was placed in the capsular bag without complication. The remaining viscoelastic was removed from the eye with the irrigation-aspiration handpiece. The wounds were hydrated. The anterior chamber was flushed with Miostat and the eye was inflated to physiologic pressure. 0.1 mL of cefuroxime concentration 10 mg/mL was placed in the anterior chamber. The wounds were found to be water tight. The eye was dressed with Vigamox. The patient was given protective glasses to  wear throughout the day and a shield with which to sleep tonight. The patient was also given drops with which to begin a drop regimen today and will follow-up with me in one day.  Implant Name Type Inv. Item Serial No. Manufacturer Lot No. LRB No. Used  LENS IOL SYMFONY 19.5 - [id number] Intraocular Lens LENS IOL SYMFONY 19.5 [id number]   Right 1   Procedure(s) with comments: CATARACT EXTRACTION PHACO AND INTRAOCULAR LENS PLACEMENT (IOC) (Right) - [place] 31.0 AP% 20.4 CDE 6.30 Fluid Pck Lot # [id number]  Electronically signed: Yannely Kintzel LOUIS 05/21/2016 10:38 AM

## 2016-06-10 ENCOUNTER — Other Ambulatory Visit: Payer: Self-pay | Admitting: Cardiovascular Disease

## 2016-06-26 ENCOUNTER — Other Ambulatory Visit: Payer: Self-pay | Admitting: Cardiovascular Disease

## 2016-07-24 DIAGNOSIS — Z23 Encounter for immunization: Secondary | ICD-10-CM | POA: Diagnosis not present

## 2016-08-24 DIAGNOSIS — J301 Allergic rhinitis due to pollen: Secondary | ICD-10-CM | POA: Diagnosis not present

## 2016-08-30 DIAGNOSIS — J301 Allergic rhinitis due to pollen: Secondary | ICD-10-CM | POA: Diagnosis not present

## 2016-09-03 ENCOUNTER — Other Ambulatory Visit: Payer: Self-pay

## 2016-09-03 MED ORDER — APIXABAN 5 MG PO TABS: 5.0000 mg | ORAL_TABLET | Freq: Two times a day (BID) | ORAL | 6 refills | Status: DC

## 2016-09-22 ENCOUNTER — Other Ambulatory Visit: Payer: Self-pay | Admitting: Cardiovascular Disease

## 2016-11-05 ENCOUNTER — Telehealth: Payer: Self-pay | Admitting: Cardiovascular Disease

## 2016-11-05 DIAGNOSIS — F3341 Major depressive disorder, recurrent, in partial remission: Secondary | ICD-10-CM | POA: Diagnosis not present

## 2016-11-05 NOTE — Telephone Encounter (Signed)
  Pt would like Eliquis samples. Please call.    

## 2016-11-05 NOTE — Telephone Encounter (Signed)
Called pt at work number. Receptionist states he has gone for the day. Called cell number. Male who answered states he is not home at this time.

## 2016-11-05 NOTE — Telephone Encounter (Signed)
Attempted to contact pt @ work number however, he is not in at this time. No answer, no VM set up on cell phone.

## 2016-11-06 NOTE — Telephone Encounter (Addendum)
Informed pt we are out of Eliquis and will call when more samples are delivered.  Pt has already used 30 day card; has two days worth of medication left.

## 2016-11-06 NOTE — Telephone Encounter (Signed)
Attempted to contact pt at home and office.  S/w wife, Consuella Lose, (on [place]). I will leave eliquis 2.5mg  samples at the front desk for pick up and will call when we have 5mg  samples. Explained pt should take two 2.5mg  tablets (total of 5mg ) twice daily.  She repeated information back to me.  Medication Samples have been provided to the patient.  Drug name: Eliquis       Strength: 2.5mg         Qty: 1 box  LOT: [id number]  Exp.Date: 07/2017  Dosing instructions: Take 2 tablets (total 5mg ) twice daily  The patient wife has been instructed regarding the correct time, dose, and frequency of taking this medication.  Shon Baton 2:47 PM 11/06/2016

## 2016-11-08 NOTE — Telephone Encounter (Signed)
Informed pt I have one box of eliquis 5mg  ready for pick up. He refilled prescription and paid out-of-pocket therefore, will not need the sample box. Pt is appreciative.

## 2016-11-23 DIAGNOSIS — Z961 Presence of intraocular lens: Secondary | ICD-10-CM | POA: Diagnosis not present

## 2016-12-31 ENCOUNTER — Other Ambulatory Visit: Payer: Self-pay | Admitting: Cardiovascular Disease

## 2017-01-10 ENCOUNTER — Other Ambulatory Visit: Payer: Self-pay

## 2017-01-10 ENCOUNTER — Telehealth: Payer: Self-pay | Admitting: Cardiovascular Disease

## 2017-01-10 MED ORDER — LOSARTAN POTASSIUM 25 MG PO TABS: 25.0000 mg | ORAL_TABLET | Freq: Every day | ORAL | 3 refills | Status: DC

## 2017-01-10 NOTE — Telephone Encounter (Signed)
*  STAT* If patient is at the pharmacy, call can be transferred to refill team.   1. Which medications need to be refilled? (please list name of each medication and dose if known) losartan (COZAAR) 25 MG tablet  2. Which pharmacy/location (including street and city if local pharmacy) is medication to be sent to ? [facility] in [place]  3. Do they need a 30 day or 90 day supply? 90 day

## 2017-01-10 NOTE — Telephone Encounter (Signed)
Sent in Refill for   Losartan 25 mg tables Sent to [facility] in [place] with a 90 day Supply.

## 2017-01-10 NOTE — Telephone Encounter (Signed)
Requested Prescriptions   Signed Prescriptions Disp Refills  . losartan (COZAAR) 25 MG tablet 90 tablet 3    Sig: Take 1 tablet (25 mg total) by mouth daily.    Authorizing Provider: Antonieta Iba    Ordering User: Margrett Rud

## 2017-01-23 ENCOUNTER — Encounter: Payer: Self-pay | Admitting: Cardiovascular Disease

## 2017-01-23 ENCOUNTER — Telehealth: Payer: Self-pay | Admitting: Cardiovascular Disease

## 2017-01-23 NOTE — Telephone Encounter (Signed)
Pt requested eliquis samples. Notified for pick up via MyChart.  Medication Samples have been provided to the patient. Drug name: Eliquis       Strength: 5mg         Qty: 4 boxes  LOT: [id number]  Exp.Date: 01/2019 Dosing instructions: 5mg  (1 tablet) twice daily  Renee Rival Yow 2:10 PM 01/23/2017

## 2017-01-27 ENCOUNTER — Other Ambulatory Visit: Payer: Self-pay | Admitting: Cardiovascular Disease

## 2017-01-28 ENCOUNTER — Ambulatory Visit: Payer: Medicare Other | Admitting: Cardiovascular Disease

## 2017-02-12 ENCOUNTER — Ambulatory Visit: Payer: Medicare Other | Admitting: Cardiovascular Disease

## 2017-02-27 DIAGNOSIS — F3341 Major depressive disorder, recurrent, in partial remission: Secondary | ICD-10-CM | POA: Diagnosis not present

## 2017-03-24 ENCOUNTER — Other Ambulatory Visit: Payer: Self-pay | Admitting: Cardiovascular Disease

## 2017-03-25 NOTE — Telephone Encounter (Signed)
Pt needs f/u appt with Arida. Thanks! 

## 2017-03-28 NOTE — Telephone Encounter (Signed)
Pt is coming in on 04/02/17 to see Dr Kirke Corin

## 2017-04-02 ENCOUNTER — Ambulatory Visit: Payer: Medicare Other | Admitting: Cardiovascular Disease

## 2017-04-21 ENCOUNTER — Other Ambulatory Visit: Payer: Self-pay | Admitting: Cardiovascular Disease

## 2017-05-13 ENCOUNTER — Encounter: Payer: Self-pay | Admitting: Cardiovascular Disease

## 2017-05-13 ENCOUNTER — Telehealth: Payer: Self-pay | Admitting: Cardiovascular Disease

## 2017-05-13 NOTE — Telephone Encounter (Signed)
Pt requested Eliquis samples via MyChart message. Samples at front for pick up. Pt notified.  Samples of this drug were given to the patient, quantity 3 boxes, Lot Number [id number] Exp: 10/2019

## 2017-05-16 ENCOUNTER — Ambulatory Visit: Payer: Medicare Other | Admitting: Cardiovascular Disease

## 2017-05-20 ENCOUNTER — Other Ambulatory Visit: Payer: Self-pay | Admitting: Cardiovascular Disease

## 2017-05-24 DIAGNOSIS — F3341 Major depressive disorder, recurrent, in partial remission: Secondary | ICD-10-CM | POA: Diagnosis not present

## 2017-06-07 ENCOUNTER — Other Ambulatory Visit: Payer: Self-pay | Admitting: Cardiovascular Disease

## 2017-06-11 ENCOUNTER — Telehealth: Payer: Self-pay | Admitting: Cardiovascular Disease

## 2017-06-11 NOTE — Telephone Encounter (Signed)
Pt has not picked up samples from 6/25,  3 boxes, Lot Number [id number] Exp: 10/2019 Placed back in medication room

## 2017-06-17 ENCOUNTER — Other Ambulatory Visit: Payer: Self-pay | Admitting: Cardiovascular Disease

## 2017-06-17 ENCOUNTER — Telehealth: Payer: Self-pay | Admitting: Cardiovascular Disease

## 2017-06-17 NOTE — Telephone Encounter (Signed)
Attempted to reach patient. No answer and no voicemails set up.  Patient is past due for 9 month follow-up appt.  Patient has cancelled past scheduled appts. Patient needs to schedule a follow up. Refill sent in for one month supply and message to pharmacy to have patient contact office for appt.

## 2017-06-17 NOTE — Telephone Encounter (Signed)
Please advise if okay to refill,  Patient was last seen 04/09/16 and has cancelled last 4 appointments we have tried to make.

## 2017-06-17 NOTE — Telephone Encounter (Signed)
Patient calling the office for samples of medication:   1.  What medication and dosage are you requesting samples for? Eliquis 5 mg po BID   2.  Are you currently out of this medication? no

## 2017-06-17 NOTE — Telephone Encounter (Signed)
Patient notified samples available of Eliquis 5 mg with Lot# [id number] Exp. 12/20.

## 2017-06-19 NOTE — Telephone Encounter (Signed)
Patient scheduled appt on 06/17/17 to come see Dr Kirke Corin on 08/19/17.  Attempted to reach out to patient to see if he will have enough Eliquis to get him to that appt. No answer and no voicemail set up. Patient had not arrived to work yet when attempted that number.

## 2017-07-19 ENCOUNTER — Encounter: Payer: Self-pay | Admitting: Cardiovascular Disease

## 2017-07-30 ENCOUNTER — Other Ambulatory Visit: Payer: Self-pay | Admitting: Cardiovascular Disease

## 2017-08-01 DIAGNOSIS — Z23 Encounter for immunization: Secondary | ICD-10-CM | POA: Diagnosis not present

## 2017-08-19 ENCOUNTER — Ambulatory Visit: Payer: Medicare Other | Admitting: Cardiovascular Disease

## 2017-08-21 DIAGNOSIS — F3341 Major depressive disorder, recurrent, in partial remission: Secondary | ICD-10-CM | POA: Diagnosis not present

## 2017-09-07 ENCOUNTER — Other Ambulatory Visit: Payer: Self-pay | Admitting: Cardiovascular Disease

## 2017-10-07 ENCOUNTER — Other Ambulatory Visit: Payer: Self-pay | Admitting: Cardiovascular Disease

## 2017-10-07 ENCOUNTER — Other Ambulatory Visit: Payer: Self-pay

## 2017-10-07 DIAGNOSIS — Z7901 Long term (current) use of anticoagulants: Secondary | ICD-10-CM

## 2017-10-07 NOTE — Telephone Encounter (Signed)
Please review for refill, Thanks !  

## 2017-11-05 ENCOUNTER — Other Ambulatory Visit: Payer: Self-pay | Admitting: Cardiovascular Disease

## 2017-11-05 NOTE — Telephone Encounter (Signed)
Please review for refill, Thanks !  

## 2017-11-05 NOTE — Telephone Encounter (Signed)
Refill Request.  

## 2017-11-07 ENCOUNTER — Telehealth: Payer: Self-pay | Admitting: Cardiovascular Disease

## 2017-11-07 ENCOUNTER — Other Ambulatory Visit: Payer: Self-pay

## 2017-11-07 MED ORDER — APIXABAN 5 MG PO TABS: 5.0000 mg | ORAL_TABLET | Freq: Two times a day (BID) | ORAL | 0 refills | Status: DC

## 2017-11-07 NOTE — Telephone Encounter (Signed)
Refill sent.

## 2017-11-07 NOTE — Telephone Encounter (Signed)
°*  STAT* If patient is at the pharmacy, call can be transferred to refill team.   1. Which medications need to be refilled? (please list name of each medication and dose if known) eliquis   2. Which pharmacy/location (including street and city if local pharmacy) is medication to be sent to?[facility] on [address] (took over [facility])   3. Do they need a 30 day or 90 day supply? 30 day

## 2017-11-07 NOTE — Telephone Encounter (Signed)
Requested Prescriptions   Signed Prescriptions Disp Refills  . apixaban (ELIQUIS) 5 MG TABS tablet 60 tablet 0    Sig: Take 1 tablet (5 mg total) by mouth 2 (two) times daily.    Authorizing Provider: ARIDA, MUHAMMAD A    Ordering User: Evadene Wardrip N    

## 2017-11-22 DIAGNOSIS — F3341 Major depressive disorder, recurrent, in partial remission: Secondary | ICD-10-CM | POA: Diagnosis not present

## 2017-12-05 ENCOUNTER — Other Ambulatory Visit: Payer: Self-pay | Admitting: Cardiovascular Disease

## 2017-12-05 NOTE — Telephone Encounter (Signed)
Please review for refill, thanks ! 

## 2017-12-11 ENCOUNTER — Encounter: Payer: Self-pay | Admitting: Nurse Practitioner

## 2017-12-11 ENCOUNTER — Ambulatory Visit (INDEPENDENT_AMBULATORY_CARE_PROVIDER_SITE_OTHER): Payer: Medicare Other | Admitting: Nurse Practitioner

## 2017-12-11 VITALS — BP 118/80 | HR 50 | Ht 72.0 in | Wt 173.0 lb

## 2017-12-11 DIAGNOSIS — I428 Other cardiomyopathies: Secondary | ICD-10-CM

## 2017-12-11 DIAGNOSIS — I482 Chronic atrial fibrillation, unspecified: Secondary | ICD-10-CM

## 2017-12-11 DIAGNOSIS — E782 Mixed hyperlipidemia: Secondary | ICD-10-CM

## 2017-12-11 DIAGNOSIS — I5022 Chronic systolic (congestive) heart failure: Secondary | ICD-10-CM | POA: Diagnosis not present

## 2017-12-11 MED ORDER — SIMVASTATIN 20 MG PO TABS: 10.0000 mg | ORAL_TABLET | Freq: Every day | ORAL | 3 refills | Status: DC

## 2017-12-11 MED ORDER — METOPROLOL TARTRATE 100 MG PO TABS: 100.0000 mg | ORAL_TABLET | Freq: Two times a day (BID) | ORAL | 3 refills | Status: DC

## 2017-12-11 MED ORDER — FUROSEMIDE 20 MG PO TABS: 20.0000 mg | ORAL_TABLET | Freq: Every day | ORAL | 3 refills | Status: DC | PRN

## 2017-12-11 MED ORDER — DIGOXIN 125 MCG PO TABS: 125.0000 ug | ORAL_TABLET | Freq: Every day | ORAL | 3 refills | Status: DC

## 2017-12-11 MED ORDER — LOSARTAN POTASSIUM 25 MG PO TABS: 25.0000 mg | ORAL_TABLET | Freq: Every day | ORAL | 3 refills | Status: DC

## 2017-12-11 MED ORDER — APIXABAN 5 MG PO TABS: 5.0000 mg | ORAL_TABLET | Freq: Two times a day (BID) | ORAL | 3 refills | Status: DC

## 2017-12-11 NOTE — Patient Instructions (Signed)
Medication Instructions: - Your physician recommends that you continue on your current medications as directed. Please refer to the Current Medication list given to you today.  Labwork: - Your physician recommends that you return tomorrow for: a FASTING lipid profile/ CBC/ CMET/ Digoxin   Procedures/Testing: - none ordered  Follow-Up: - Your physician wants you to follow-up in: 1 year with Dr. Kirke Corin.  You will receive a reminder letter in the mail two months in advance. If you don't receive a letter, please call our office to schedule the follow-up appointment.   Any Additional Special Instructions Will Be Listed Below (If Applicable).     If you need a refill on your cardiac medications before your next appointment, please call your pharmacy.

## 2017-12-11 NOTE — Progress Notes (Signed)
Office Visit    Patient Name: Jeffery Shaw Date of Encounter: 12/11/2017  Primary Care Provider:  Lyndon Code, MD Primary Cardiologist:  Lorine Bears, MD  Chief Complaint    72 year old male with a history of permanent atrial fibrillation, nonischemic cardiomyopathy, chronic systolic congestive heart failure, obstructive sleep apnea, PFO, and hyperlipidemia, who presents for follow-up.  Past Medical History    Past Medical History:  Diagnosis Date  . Asthma   . Chronic atrial fibrillation (HCC)    a. Rate-controlled; b. CHA2DS2VASc = 2--> Eliquis.  . Chronic systolic heart failure (HCC)    a. 07/2012 TEE: EF 30-35%; b. 07/2012 Cath: no significant coronary artery disease with an ejection fraction of 35%. Normal filling pressures with only mild PAH; c. 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 Nl PASP.  [name] Emphysema, unspecified (HCC)   . GERD (gastroesophageal reflux disease)   . Hyperlipidemia   . Hypothyroidism   . Mildly dilated aortic root (HCC)    a. 12/2013 Echo: Ao root 40mm.  [name] NICM (nonischemic cardiomyopathy) (HCC)    a. 07/2012 TEE: EF 30-35%; b. 07/2012 Cath: nonobs dzs, EF 35%; c. 12/2013 Echo: EF 40-45%.  . Palpitations   . Pancreatitis 1980   due to ETOH  . Panic attacks   . PFO (patent foramen ovale)    a. 2013 TEE & Cath: Tunnelled PFO with a small left-to-right atrial shunt noted on TEE with a QP/QS ratio of 1.37 by cardiac cath; b. 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  . Sleep apnea    CPAP   Past Surgical History:  Procedure Laterality Date  . CARDIAC CATHETERIZATION  07/2012   [facility]  . CARDIOVERSION  09/15/12  . CATARACT EXTRACTION W/PHACO Left 04/17/2016   Procedure: CATARACT EXTRACTION PHACO AND INTRAOCULAR LENS PLACEMENT (IOC);  Surgeon: Galen Manila, MD;  Location: [facility] ORS;  Service: Ophthalmology;  Laterality: Left;  [place] 32.9 AP% 20.1 CDE 6.59 FLUID PACK  LOT # [id number]  . CATARACT EXTRACTION W/PHACO Right 05/21/2016   Procedure: CATARACT EXTRACTION PHACO AND INTRAOCULAR LENS PLACEMENT (IOC);  Surgeon: Galen Manila, MD;  Location: [facility] ORS;  Service: Ophthalmology;  Laterality: Right;  [place] 31.0 AP% 20.4 CDE 6.30 Fluid Pck Lot # [id number]  . CYSTOSCOPY W/ URETEROSCOPY    . TEE WITHOUT CARDIOVERSION      Allergies  No Known Allergies  History of Present Illness    72 year old male with the above complex past medical history including permanent atrial fibrillation, nonischemic cardiomyopathy and systolic heart failure with an EF of 40-45%, small atrial septal defect, sleep apnea, and hyperlipidemia.  He was last seen in clinic in May 2017.  Since then, he has continued to do well.  Historically, he has noted some mild fatigue on beta-blocker therapy but says now that he thinks he is finally used to it.  He remains very active and works full-time without significant limitations.  He denies chest pain, palpitations, dyspnea, PND, orthopnea, dizziness, syncope, edema, or early satiety.  Home Medications    Prior to Admission medications   Medication Sig Start Date End Date Taking? Authorizing Provider  apixaban (ELIQUIS) 5 MG TABS tablet Take 1 tablet (5 mg total) by mouth 2 (two) times daily. 12/11/17  Yes Creig Hines, NP  buPROPion [facility] SR) 100 MG 12 hr tablet Take 100 mg by mouth daily.   Yes [provider]  clonazePAM (KLONOPIN) 0.5 MG tablet Take 2.5 mg by mouth 2 (two) times daily as needed for anxiety.   Yes [provider]  digoxin (DIGOX) 0.125 MG tablet Take 1 tablet (125 mcg total) by mouth daily. 12/11/17  Yes Creig Hines, NP  fluticasone (FLONASE) 50 MCG/ACT nasal spray Place 1 spray into both nostrils daily as needed.  11/26/13  Yes [provider]  furosemide (LASIX) 20 MG tablet Take 1 tablet (20 mg total) by mouth daily as needed. 12/11/17  Yes Creig Hines,  NP  levothyroxine (SYNTHROID, LEVOTHROID) 75 MCG tablet Take 75 mcg by mouth daily.   Yes [provider]  losartan (COZAAR) 25 MG tablet Take 1 tablet (25 mg total) by mouth daily. 12/11/17  Yes Creig Hines, NP  metoprolol tartrate (LOPRESSOR) 100 MG tablet Take 1 tablet (100 mg total) by mouth 2 (two) times daily. 12/11/17  Yes Creig Hines, NP  Plant Sterols and Stanols (CHOLEST OFF PO) Take 1 tablet by mouth 2 (two) times daily.    Yes [provider]  rOPINIRole (REQUIP) 0.5 MG tablet Take 0.5 mg by mouth as needed. Reported on 05/21/2016 08/21/14  Yes [provider]  sertraline (ZOLOFT) 50 MG tablet Take 50 mg by mouth daily.   Yes [provider]  simvastatin (ZOCOR) 20 MG tablet Take 0.5 tablets (10 mg total) by mouth daily. 12/11/17  Yes Creig Hines, NP  VITAMIN D, ERGOCALCIFEROL, PO Take 400 Units by mouth daily.   Yes [provider]    Review of Systems    He denies chest pain, palpitations, dyspnea, pnd, orthopnea, n, v, dizziness, syncope, edema, weight gain, or early satiety.  All other systems reviewed and are otherwise negative except as noted above.  Physical Exam    VS:  BP 118/80 (BP Location: Left Arm, Patient Position: Sitting, Cuff Size: Normal)   Pulse (!) 50   Ht 6' (1.829 m)   Wt 173 lb (78.5 kg)   BMI 23.46 kg/m  , BMI Body mass index is 23.46 kg/m. GEN: Well nourished, well developed, in no acute distress.  HEENT: normal.  Neck: Supple, no JVD, carotid bruits, or masses. Cardiac: IR, IR, no murmurs, rubs, or gallops. No clubbing, cyanosis, edema.  Radials/DP/PT 2+ and equal bilaterally.  Respiratory:  Respirations regular and unlabored, clear to auscultation bilaterally. GI: Soft, nontender, nondistended, BS + x 4. MS: no deformity or atrophy. Skin: warm and dry, no rash. Neuro:  Strength and sensation are intact. Psych: Normal affect.  Accessory Clinical Findings    ECG -  afib, 50, no acute st/t changes.  Assessment & Plan    1.  Permanent Afib:  Well rate-controlled on  blocker and digoxin therapy.  He remains on eliquis 5 bid in the setting of a CHA2DS2VASc = 2.  He hasn't had any labs in over a year.  I will f/u a bmet, cbc, and digoxin level today.  2. HFrEF/NICM:  Doing well and only rarely using prn lasix.  Wt stable and euvolemic on exam.  Cont  blocker, digoxin, ARB.  F/u bmet today.  3.  Hyperlipidemia: Follow-up lipids and LFTs.  Continue statin therapy.  4.  Obstructive sleep apnea: Compliant with CPAP.  6.  Disposition: Follow-up labs tomorrow as he is not fasting today.  Plan to follow-up with Dr. Kirke Corin in 6-9 months.  Nicolasa Ducking, NP 12/11/2017, 5:22 PM

## 2017-12-12 ENCOUNTER — Telehealth: Payer: Self-pay | Admitting: Cardiovascular Disease

## 2017-12-12 ENCOUNTER — Other Ambulatory Visit: Payer: Self-pay | Admitting: *Deleted

## 2017-12-12 ENCOUNTER — Encounter: Payer: Self-pay | Admitting: Nurse Practitioner

## 2017-12-12 ENCOUNTER — Other Ambulatory Visit (INDEPENDENT_AMBULATORY_CARE_PROVIDER_SITE_OTHER): Payer: Medicare Other | Admitting: *Deleted

## 2017-12-12 DIAGNOSIS — I5022 Chronic systolic (congestive) heart failure: Secondary | ICD-10-CM | POA: Diagnosis not present

## 2017-12-12 DIAGNOSIS — E782 Mixed hyperlipidemia: Secondary | ICD-10-CM | POA: Diagnosis not present

## 2017-12-12 DIAGNOSIS — I482 Chronic atrial fibrillation, unspecified: Secondary | ICD-10-CM

## 2017-12-12 MED ORDER — FUROSEMIDE 20 MG PO TABS: 20.0000 mg | ORAL_TABLET | Freq: Every day | ORAL | 3 refills | Status: DC | PRN

## 2017-12-12 MED ORDER — APIXABAN 5 MG PO TABS: 5.0000 mg | ORAL_TABLET | Freq: Two times a day (BID) | ORAL | 3 refills | Status: DC

## 2017-12-12 MED ORDER — SIMVASTATIN 20 MG PO TABS: 10.0000 mg | ORAL_TABLET | Freq: Every day | ORAL | 3 refills | Status: DC

## 2017-12-12 MED ORDER — DIGOXIN 125 MCG PO TABS: 125.0000 ug | ORAL_TABLET | Freq: Every day | ORAL | 3 refills | Status: DC

## 2017-12-12 MED ORDER — METOPROLOL TARTRATE 100 MG PO TABS: 100.0000 mg | ORAL_TABLET | Freq: Two times a day (BID) | ORAL | 3 refills | Status: DC

## 2017-12-12 MED ORDER — LOSARTAN POTASSIUM 25 MG PO TABS: 25.0000 mg | ORAL_TABLET | Freq: Every day | ORAL | 3 refills | Status: DC

## 2017-12-12 NOTE — Telephone Encounter (Signed)
Requested Prescriptions   Signed Prescriptions Disp Refills  . apixaban (ELIQUIS) 5 MG TABS tablet 180 tablet 3    Sig: Take 1 tablet (5 mg total) by mouth 2 (two) times daily.    Authorizing Provider: Lorine Bears A    Ordering User: Iverson Alamin C  . digoxin (DIGOX) 0.125 MG tablet 90 tablet 3    Sig: Take 1 tablet (125 mcg total) by mouth daily.    Authorizing Provider: Lorine Bears A    Ordering User: Shawnie Dapper, Ayodele Sangalang C  . metoprolol tartrate (LOPRESSOR) 100 MG tablet 180 tablet 3    Sig: Take 1 tablet (100 mg total) by mouth 2 (two) times daily.    Authorizing Provider: Lorine Bears A    Ordering User: Shawnie Dapper, Robertta Halfhill C  . furosemide (LASIX) 20 MG tablet 90 tablet 3    Sig: Take 1 tablet (20 mg total) by mouth daily as needed.    Authorizing Provider: Lorine Bears A    Ordering User: Iverson Alamin C  . losartan (COZAAR) 25 MG tablet 90 tablet 3    Sig: Take 1 tablet (25 mg total) by mouth daily.    Authorizing Provider: Lorine Bears A    Ordering User: Iverson Alamin C  . simvastatin (ZOCOR) 20 MG tablet 45 tablet 3    Sig: Take 0.5 tablets (10 mg total) by mouth daily.    Authorizing Provider: Lorine Bears A    Ordering User: Kendrick Fries

## 2017-12-12 NOTE — Telephone Encounter (Signed)
°*  STAT* If patient is at the pharmacy, call can be transferred to refill team.   1. Which medications need to be refilled? (please list name of each medication and dose if known)  Eliquis 5 MG Digoxin 0.125 MG Metoprolol tartrate 100 MG Furosemide 20 MG Losartan 25 MG Simvastatin 20 MG   2. Which pharmacy/location (including street and city if local pharmacy) is medication to be sent to? [facility] in [place]  3. Do they need a 30 day or 90 day supply?  Patient states refills were called in to [facility] but they are in transition and they told him to come back later due to a mix up He would like refills sent to [facility] in [place] instead

## 2017-12-12 NOTE — Telephone Encounter (Signed)
Requested Prescriptions   Signed Prescriptions Disp Refills  . apixaban (ELIQUIS) 5 MG TABS tablet 180 tablet 3    Sig: Take 1 tablet (5 mg total) by mouth 2 (two) times daily.    Authorizing Provider: ARIDA, MUHAMMAD A    Ordering User: LOPEZ, MARINA C  . digoxin (DIGOX) 0.125 MG tablet 90 tablet 3    Sig: Take 1 tablet (125 mcg total) by mouth daily.    Authorizing Provider: ARIDA, MUHAMMAD A    Ordering User: LOPEZ, MARINA C  . metoprolol tartrate (LOPRESSOR) 100 MG tablet 180 tablet 3    Sig: Take 1 tablet (100 mg total) by mouth 2 (two) times daily.    Authorizing Provider: ARIDA, MUHAMMAD A    Ordering User: LOPEZ, MARINA C  . furosemide (LASIX) 20 MG tablet 90 tablet 3    Sig: Take 1 tablet (20 mg total) by mouth daily as needed.    Authorizing Provider: ARIDA, MUHAMMAD A    Ordering User: LOPEZ, MARINA C  . losartan (COZAAR) 25 MG tablet 90 tablet 3    Sig: Take 1 tablet (25 mg total) by mouth daily.    Authorizing Provider: ARIDA, MUHAMMAD A    Ordering User: LOPEZ, MARINA C  . simvastatin (ZOCOR) 20 MG tablet 45 tablet 3    Sig: Take 0.5 tablets (10 mg total) by mouth daily.    Authorizing Provider: ARIDA, MUHAMMAD A    Ordering User: LOPEZ, MARINA C    

## 2017-12-12 NOTE — Telephone Encounter (Signed)
No answer. Voicemail has not been set up.

## 2017-12-12 NOTE — Telephone Encounter (Signed)
Patient would like to know some information about the losartan recall Please call to discuss

## 2017-12-13 ENCOUNTER — Other Ambulatory Visit: Payer: Self-pay | Admitting: Internal Medicine

## 2017-12-13 ENCOUNTER — Other Ambulatory Visit: Payer: Self-pay | Admitting: Cardiovascular Disease

## 2017-12-13 ENCOUNTER — Encounter: Payer: Self-pay | Admitting: Nurse Practitioner

## 2017-12-13 LAB — CBC WITH DIFFERENTIAL/PLATELET
Basophils Absolute: 0 10*3/uL (ref 0.0–0.2)
Basos: 1 %
EOS (ABSOLUTE): 0.3 10*3/uL (ref 0.0–0.4)
EOS: 4 %
HEMATOCRIT: 45 % (ref 37.5–51.0)
HEMOGLOBIN: 14.9 g/dL (ref 13.0–17.7)
IMMATURE GRANS (ABS): 0 10*3/uL (ref 0.0–0.1)
Immature Granulocytes: 0 %
LYMPHS ABS: 2.3 10*3/uL (ref 0.7–3.1)
Lymphs: 29 %
MCH: 32.1 pg (ref 26.6–33.0)
MCHC: 33.1 g/dL (ref 31.5–35.7)
MCV: 97 fL (ref 79–97)
MONOCYTES: 12 %
Monocytes Absolute: 0.9 10*3/uL (ref 0.1–0.9)
Neutrophils Absolute: 4.2 10*3/uL (ref 1.4–7.0)
Neutrophils: 54 %
Platelets: 260 10*3/uL (ref 150–379)
RBC: 4.64 x10E6/uL (ref 4.14–5.80)
RDW: 14.1 % (ref 12.3–15.4)
WBC: 7.8 10*3/uL (ref 3.4–10.8)

## 2017-12-13 LAB — COMPREHENSIVE METABOLIC PANEL
ALBUMIN: 3.9 g/dL (ref 3.6–4.8)
ALK PHOS: 49 IU/L (ref 39–117)
ALT: 10 IU/L (ref 0–44)
AST: 19 IU/L (ref 0–40)
Albumin/Globulin Ratio: 1.8 (ref 1.2–2.2)
BUN / CREAT RATIO: 12 (ref 10–24)
BUN: 13 mg/dL (ref 8–27)
Bilirubin Total: 1 mg/dL (ref 0.0–1.2)
CALCIUM: 8.8 mg/dL (ref 8.6–10.2)
CO2: 27 mmol/L (ref 20–29)
CREATININE: 1.09 mg/dL (ref 0.76–1.27)
Chloride: 102 mmol/L (ref 96–106)
GFR, EST AFRICAN AMERICAN: 80 mL/min/{1.73_m2} (ref >59)
GFR, EST NON AFRICAN AMERICAN: 69 mL/min/{1.73_m2} (ref >59)
Globulin, Total: 2.2 g/dL (ref 1.5–4.5)
Glucose: 99 mg/dL (ref 65–99)
Potassium: 4.5 mmol/L (ref 3.5–5.2)
Sodium: 143 mmol/L (ref 134–144)
TOTAL PROTEIN: 6.1 g/dL (ref 6.0–8.5)

## 2017-12-13 LAB — DIGOXIN LEVEL: Digoxin, Serum: 0.5 ng/mL (ref 0.5–0.9)

## 2017-12-13 LAB — LIPID PANEL
CHOL/HDL RATIO: 3.2 ratio (ref 0.0–5.0)
Cholesterol, Total: 127 mg/dL (ref 100–199)
HDL: 40 mg/dL (ref >39)
LDL CALC: 60 mg/dL (ref 0–99)
Triglycerides: 133 mg/dL (ref 0–149)
VLDL CHOLESTEROL CAL: 27 mg/dL (ref 5–40)

## 2017-12-19 NOTE — Telephone Encounter (Signed)
Attempted to call the patient- no answer/ no voice mail set up.

## 2017-12-24 NOTE — Telephone Encounter (Signed)
Attempted to reach patient. No answer. No voicemail set up.

## 2017-12-29 ENCOUNTER — Other Ambulatory Visit: Payer: Self-pay | Admitting: Cardiovascular Disease

## 2018-01-06 ENCOUNTER — Other Ambulatory Visit: Payer: Self-pay | Admitting: Cardiovascular Disease

## 2018-01-06 NOTE — Telephone Encounter (Signed)
Please review for refill. Thanks!  

## 2018-03-07 ENCOUNTER — Telehealth: Payer: Self-pay | Admitting: Cardiovascular Disease

## 2018-03-07 NOTE — Telephone Encounter (Signed)
Patient came into office requesting samples due to delay in getting his prescriptions.   Medication Samples have been provided to the patient.  Drug name: Eliquis       Strength: 5 mg        Qty: 4 boxes  LOT: [id number]  Exp.Date: Jun 2021   Patient instructed to contact his pharmacy to advise of incorrect address. He was appreciative for the samples and had no further questions at this time.

## 2018-03-07 NOTE — Telephone Encounter (Signed)
Patient in lobby States his Silver scripts has not sent his medication to the right address  It should be going to [address] [place] [address]  1.  What medication and dosage are you requesting samples for? ELIQUIS 5 MG   2.  Are you currently out of this medication? Has 4 left - takes 2 per day   Would like to know if he can have samples while waiting for new prescription

## 2018-07-28 DIAGNOSIS — F3341 Major depressive disorder, recurrent, in partial remission: Secondary | ICD-10-CM | POA: Diagnosis not present

## 2018-08-14 DIAGNOSIS — Z23 Encounter for immunization: Secondary | ICD-10-CM | POA: Diagnosis not present

## 2018-09-26 DIAGNOSIS — M13842 Other specified arthritis, left hand: Secondary | ICD-10-CM | POA: Diagnosis not present

## 2018-10-27 DIAGNOSIS — F410 Panic disorder [episodic paroxysmal anxiety] without agoraphobia: Secondary | ICD-10-CM | POA: Diagnosis not present

## 2018-12-23 ENCOUNTER — Other Ambulatory Visit: Payer: Self-pay | Admitting: Cardiovascular Disease

## 2019-01-08 ENCOUNTER — Other Ambulatory Visit: Payer: Self-pay | Admitting: Cardiovascular Disease

## 2019-01-08 NOTE — Telephone Encounter (Signed)
*  STAT* If patient is at the pharmacy, call can be transferred to refill team.   1. Which medications need to be refilled? (please list name of each medication and dose if known) Losartan  2. Which pharmacy/location (including street and city if local pharmacy) is medication to be sent to? [facility] [place]  3. Do they need a 30 day or 90 day supply? 90  

## 2019-01-21 DIAGNOSIS — F410 Panic disorder [episodic paroxysmal anxiety] without agoraphobia: Secondary | ICD-10-CM | POA: Diagnosis not present

## 2019-01-27 ENCOUNTER — Other Ambulatory Visit: Payer: Self-pay | Admitting: Cardiovascular Disease

## 2019-01-27 NOTE — Telephone Encounter (Signed)
This pt is overdue for follow-up last saw Ward Givens, [place] on 12/11/17, was supposed to f/u in 1 year with Dr Kirke Corin.  No appointment scheduled, pt is overdue for follow-up will need to schedule OV with Dr Kirke Corin in order to refill medications.  Please call pt to schedule 1 year follow-up appt, will need repeat labwork CMP and CBC done as well, they are also overdue.  Thanks

## 2019-01-27 NOTE — Telephone Encounter (Signed)
Please review for refill.  

## 2019-01-28 ENCOUNTER — Telehealth: Payer: Self-pay | Admitting: Cardiovascular Disease

## 2019-01-28 NOTE — Telephone Encounter (Signed)
Please review for refill.Have routed message to scheduling. Thank you

## 2019-01-28 NOTE — Telephone Encounter (Signed)
LMOV to schedule fu   °

## 2019-01-28 NOTE — Telephone Encounter (Signed)
-----   Message from Aurelio Jew, [place] sent at 01/28/2019  7:54 AM EDT ----- Please schedule an appointment for refills.

## 2019-01-28 NOTE — Telephone Encounter (Signed)
Pt needs appt w/ Dr. Kirke Corin and CMET to eval kidney fx.

## 2019-02-17 NOTE — Telephone Encounter (Signed)
Attempted to schedule evisit .  No ans .  No vm

## 2019-02-27 ENCOUNTER — Telehealth: Payer: Medicare Other | Admitting: Cardiovascular Disease

## 2019-03-03 ENCOUNTER — Telehealth: Payer: Self-pay

## 2019-03-03 NOTE — Telephone Encounter (Signed)
Webex Visit Consent Obtained Verbally  YOUR CARDIOLOGY TEAM HAS ARRANGED FOR AN E-VISIT FOR YOUR APPOINTMENT - PLEASE REVIEW IMPORTANT INFORMATION BELOW SEVERAL DAYS PRIOR TO YOUR APPOINTMENT  Due to the recent COVID-19 pandemic, we are transitioning in-person office visits to tele-medicine visits in an effort to decrease unnecessary exposure to our patients and staff. Medicare and most insurances are covering these visits without a copay needed. We also encourage you to sign up for MyChart if you have not already done so. You will need a smartphone if possible. For patients that do not have this, we can still complete the visit using a regular telephone but do prefer a smartphone to enable video when possible. You may have a close family member that lives with you that can help. If possible, we also ask that you have a blood pressure cuff and scale at home to measure your blood pressure, heart rate and weight prior to your scheduled appointment. Patients with clinical needs that need an in-person evaluation and testing will still be able to come to the office if absolutely necessary. If you have any questions, feel free to call our office.     YOUR PROVIDER WILL BE USING THE FOLLOWING PLATFORM TO COMPLETE YOUR VISIT: WebEx   IF USING WEBEX - How to Download the WebEx App to Your SmartPhone  - If Apple device, go to [employer] and type in WebEx in the search bar. Download [employer], the blue/green circle. If Android, go to [employer] and type in [employer] in the search bar. The app is free but as with any other app download, your phone may require you to verify saved payment information or Apple/Android password.  - You do NOT have to create an account. - On the day of the visit, our staff will walk you through joining the meeting from with the meeting number/password.  IF USING MYCHART - How to Download the MyChart App to Your SmartPhone   - If Apple, go to [employer] and type in  MyChart in the search bar and download the app. If Android, ask patient to go to [employer] and type in [place] in the search bar and download the app. The app is free but as with any other app downloads, their phone may require them to verify saved payment information or Apple/Android password.  - The patient will need to then log into the app with their MyChart username and password, and select [facility] as their healthcare provider to link the account. When it is time for your visit, go to the MyChart app, find appointments, and click Begin Video Visit. Be sure to Select Allow for your device to access the Microphone and Camera for your visit. You will then be connected, and your provider will be with you shortly.  **If they have any issues connecting, or need assistance please contact MyChart service desk [phone])**  **If using a computer, in order to ensure the best quality for your visit they will need to use either of the following Internet Browsers: [employer] or Google Chrome**  IF USING DOXIMITY or DOXY.ME - The staff will give you instructions on receiving your link to join the meeting the day of your visit.      2-3 DAYS BEFORE YOUR APPOINTMENT  You will receive a telephone call from one of our [facility] team members - your caller ID may say "Unknown caller." If this is a video visit, we will confirm that  you have been able to download any necessary apps prior to the visit. We will remind you check your blood pressure, heart rate and weight prior to your scheduled appointment. If you have an Apple Watch or Kardia, please upload any pertinent ECG strips the day before or morning of your appointment to MyChart. Our staff will also make sure you have reviewed the consent and agree to move forward with your scheduled tele-health visit.     THE DAY OF YOUR APPOINTMENT  Approximately 15 minutes prior to your scheduled appointment, you will receive a  telephone call from one of HeartCare team - your caller ID may say "Unknown caller."  Our staff will confirm medications, vital signs for the day and any symptoms you may be experiencing. Please have this information available prior to the time of visit start. It may also be helpful for you to have a pad of paper and pen handy for any instructions given during your visit. They will also walk you through joining the smartphone meeting if this is a video visit.    CONSENT FOR TELE-HEALTH VISIT - PLEASE REVIEW  I hereby voluntarily request, consent and authorize [facility] and its employed or contracted physicians, physician assistants, nurse practitioners or other licensed health care professionals (the Practitioner), to provide me with telemedicine health care services (the "Services") as deemed necessary by the treating Practitioner. I acknowledge and consent to receive the Services by the Practitioner via telemedicine. I understand that the telemedicine visit will involve communicating with the Practitioner through live audiovisual communication technology and the disclosure of certain medical information by electronic transmission. I acknowledge that I have been given the opportunity to request an in-person assessment or other available alternative prior to the telemedicine visit and am voluntarily participating in the telemedicine visit.  I understand that I have the right to withhold or withdraw my consent to the use of telemedicine in the course of my care at any time, without affecting my right to future care or treatment, and that the Practitioner or I may terminate the telemedicine visit at any time. I understand that I have the right to inspect all information obtained and/or recorded in the course of the telemedicine visit and may receive copies of available information for a reasonable fee.  I understand that some of the potential risks of receiving the Services via telemedicine include:   [name] Delay or interruption in medical evaluation due to technological equipment failure or disruption; . Information transmitted may not be sufficient (e.g. poor resolution of images) to allow for appropriate medical decision making by the Practitioner; and/or  . In rare instances, security protocols could fail, causing a breach of personal health information.  Furthermore, I acknowledge that it is my responsibility to provide information about my medical history, conditions and care that is complete and accurate to the best of my ability. I acknowledge that Practitioner's advice, recommendations, and/or decision may be based on factors not within their control, such as incomplete or inaccurate data provided by me or distortions of diagnostic images or specimens that may result from electronic transmissions. I understand that the practice of medicine is not an exact science and that Practitioner makes no warranties or guarantees regarding treatment outcomes. I acknowledge that I will receive a copy of this consent concurrently upon execution via email to the email address I last provided but may also request a printed copy by calling the office of [facility].    I understand that my insurance will  be billed for this visit.   I have read or had this consent read to me. . I understand the contents of this consent, which adequately explains the benefits and risks of the Services being provided via telemedicine.  . I have been provided ample opportunity to ask questions regarding this consent and the Services and have had my questions answered to my satisfaction. . I give my informed consent for the services to be provided through the use of telemedicine in my medical care  By participating in this telemedicine visit I agree to the above. YES

## 2019-03-17 ENCOUNTER — Telehealth (INDEPENDENT_AMBULATORY_CARE_PROVIDER_SITE_OTHER): Payer: Medicare Other | Admitting: Cardiovascular Disease

## 2019-03-17 ENCOUNTER — Encounter: Payer: Self-pay | Admitting: Cardiovascular Disease

## 2019-03-17 ENCOUNTER — Other Ambulatory Visit: Payer: Self-pay

## 2019-03-17 VITALS — BP 120/80 | HR 50 | Temp 98.4°F | Ht 73.0 in | Wt 168.0 lb

## 2019-03-17 DIAGNOSIS — I5022 Chronic systolic (congestive) heart failure: Secondary | ICD-10-CM

## 2019-03-17 DIAGNOSIS — E785 Hyperlipidemia, unspecified: Secondary | ICD-10-CM

## 2019-03-17 DIAGNOSIS — I482 Chronic atrial fibrillation, unspecified: Secondary | ICD-10-CM | POA: Diagnosis not present

## 2019-03-17 MED ORDER — APIXABAN 5 MG PO TABS: 5.0000 mg | ORAL_TABLET | Freq: Two times a day (BID) | ORAL | 3 refills | Status: DC

## 2019-03-17 MED ORDER — LOSARTAN POTASSIUM 25 MG PO TABS: 25.0000 mg | ORAL_TABLET | Freq: Every day | ORAL | 3 refills | Status: DC

## 2019-03-17 MED ORDER — DIGOXIN 125 MCG PO TABS: 125.0000 ug | ORAL_TABLET | Freq: Every day | ORAL | 3 refills | Status: DC

## 2019-03-17 MED ORDER — METOPROLOL TARTRATE 100 MG PO TABS: 100.0000 mg | ORAL_TABLET | Freq: Two times a day (BID) | ORAL | 3 refills | Status: DC

## 2019-03-17 NOTE — Patient Instructions (Addendum)
Medication Instructions:  Continue same medications If you need a refill on your cardiac medications before your next appointment, please call your pharmacy.   Lab work:  Your provider would like for you to return in one month to have the following labs drawn: BMET, CBC, Digoxin level, fasting Lipid and Liver. Please go to the [facility] entrance and check in at the front desk. You do not need an appointment.    Testing/Procedures: Echocardiogram to be done in 6 months  Follow-Up: At [facility], you and your health needs are our priority.  As part of our continuing mission to provide you with exceptional heart care, we have created designated Provider Care Teams.  These Care Teams include your primary Cardiologist (physician) and Advanced Practice Providers (APPs -  Physician Assistants and Nurse Practitioners) who all work together to provide you with the care you need, when you need it. You will need a follow up appointment in 6 months.  Please call our office 2 months in advance to schedule this appointment.  You may see Lorine Bears, MD or one of the following Advanced Practice Providers on your designated Care Team:   Nicolasa Ducking, NP Eula Listen, PA-C . Marisue Ivan, PA-C

## 2019-03-17 NOTE — Addendum Note (Signed)
Addended by: Sandi Mariscal on: 03/17/2019 02:18 PM   Modules accepted: Orders

## 2019-03-17 NOTE — Progress Notes (Signed)
Virtual Visit via Video Note   This visit type was conducted due to national recommendations for restrictions regarding the COVID-19 Pandemic (e.g. social distancing) in an effort to limit this patient's exposure and mitigate transmission in our community.  Due to his co-morbid illnesses, this patient is at least at moderate risk for complications without adequate follow up.  This format is felt to be most appropriate for this patient at this time.  All issues noted in this document were discussed and addressed.  A limited physical exam was performed with this format.  Please refer to the patient's chart for his consent to telehealth for [facility].   Evaluation Performed:  Follow-up visit  Date:  03/17/2019   ID:  Jeffery Shaw, Jeffery Shaw 1946/08/31, MRN [id number]  Patient Location: Home Provider Location: Office  PCP:  Lyndon Code, MD  Cardiologist:  Lorine Bears, MD  Electrophysiologist:  None   Chief Complaint:    History of Present Illness:    Jeffery Shaw is a 73 y.o. male was seen via video visit for follow-up regarding chronic systolic heart failure, chronic atrial fibrillation and a small PFO.  TEE in 2013 showed an ejection fraction of 30-35% with normal atrial size. There was a tunneled PFO with a small to moderate left atrial shunting by color Doppler. A right and left cardiac catheterization in 07/2012  showed no significant coronary artery disease with a QP/QS shunt ratio of 1.37.  Most recent echocardiogram in February 2015 showed an ejection fraction of 40-45%. He has sleep apnea on CPAP. He did not maintain in sinus rhythm after cardioversion even with amiodarone and thus he is being treated with rate control. He has been doing extremely well and denies any chest pain or significant exertional dyspnea. No leg edema. No orthopnea or PND. No palpitations.  His heart rate is on the low side but he denies dizziness, syncope or presyncope.  The patient does not have  symptoms concerning for COVID-19 infection (fever, chills, cough, or new shortness of breath).    Past Medical History:  Diagnosis Date  . Asthma   . Chronic atrial fibrillation    a. Rate-controlled; b. CHA2DS2VASc = 2--> Eliquis.  . Chronic systolic heart failure (HCC)    a. 07/2012 TEE: EF 30-35%; b. 07/2012 Cath: no significant coronary artery disease with an ejection fraction of 35%. Normal filling pressures with only mild PAH; c. 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 Nl PASP.  [name] Emphysema, unspecified (HCC)   . GERD (gastroesophageal reflux disease)   . Hyperlipidemia   . Hypothyroidism   . Mildly dilated aortic root (HCC)    a. 12/2013 Echo: Ao root 40mm.  [name] NICM (nonischemic cardiomyopathy) (HCC)    a. 07/2012 TEE: EF 30-35%; b. 07/2012 Cath: nonobs dzs, EF 35%; c. 12/2013 Echo: EF 40-45%.  . Palpitations   . Pancreatitis 1980   due to ETOH  . Panic attacks   . PFO (patent foramen ovale)    a. 2013 TEE & Cath: Tunnelled PFO with a small left-to-right atrial shunt noted on TEE with a QP/QS ratio of 1.37 by cardiac cath; b. 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  . Sleep apnea    CPAP   Past Surgical History:  Procedure Laterality Date  . CARDIAC CATHETERIZATION  07/2012   [facility]  . CARDIOVERSION  09/15/12  . CATARACT EXTRACTION W/PHACO Left  04/17/2016   Procedure: CATARACT EXTRACTION PHACO AND INTRAOCULAR LENS PLACEMENT (IOC);  Surgeon: Galen Manila, MD;  Location: [facility] ORS;  Service: Ophthalmology;  Laterality: Left;  [place] 32.9 AP% 20.1 CDE 6.59 FLUID PACK LOT # [id number]  . CATARACT EXTRACTION W/PHACO Right 05/21/2016   Procedure: CATARACT EXTRACTION PHACO AND INTRAOCULAR LENS PLACEMENT (IOC);  Surgeon: Galen Manila, MD;  Location: [facility] ORS;  Service: Ophthalmology;  Laterality: Right;  [place] 31.0 AP% 20.4 CDE 6.30 Fluid Pck Lot # [id number]  . CYSTOSCOPY W/ URETEROSCOPY    . TEE WITHOUT  CARDIOVERSION       Current Meds  Medication Sig  . apixaban (ELIQUIS) 5 MG TABS tablet Take 1 tablet (5 mg total) by mouth 2 (two) times daily. Need appt w/ Dr. Kirke Corin for further refills.  [name] buPROPion (WELLBUTRIN SR) 100 MG 12 hr tablet Take 100 mg by mouth daily.  . clonazePAM (KLONOPIN) 0.5 MG tablet Take 2.5 mg by mouth 2 (two) times daily as needed for anxiety.  . digoxin (LANOXIN) 0.125 MG tablet TAKE 1 TABLET BY MOUTH DAILY  . fluticasone (FLONASE) 50 MCG/ACT nasal spray Place 1 spray into both nostrils daily as needed.   . furosemide (LASIX) 20 MG tablet TAKE 1 TABLET BY MOUTH EVERY DAY AS NEEDED  . levothyroxine (SYNTHROID, LEVOTHROID) 75 MCG tablet Take 75 mcg by mouth daily.  [name] losartan (COZAAR) 25 MG tablet TAKE 1 TABLET BY MOUTH DAILY  . metoprolol tartrate (LOPRESSOR) 100 MG tablet TAKE 1 TABLET BY MOUTH TWICE DAILY  . Plant Sterols and Stanols (CHOLEST OFF PO) Take 1 tablet by mouth 2 (two) times daily.   [name] rOPINIRole (REQUIP) 0.5 MG tablet Take 0.5 mg by mouth as needed. Reported on 05/21/2016  . sertraline (ZOLOFT) 50 MG tablet Take 50 mg by mouth daily.  . simvastatin (ZOCOR) 20 MG tablet Take 0.5 tablets (10 mg total) by mouth daily. (Patient taking differently: Take 10 mg by mouth. Every third day)  . VITAMIN D, ERGOCALCIFEROL, PO Take 400 Units by mouth daily.     Allergies:   Patient has no known allergies.   Social History   Tobacco Use  . Smoking status: Former Smoker    Packs/day: 0.50    Years: 20.00    Pack years: 10.00    Types: Cigarettes    Last attempt to quit: 08/07/2008    Years since quitting: 10.6  . Smokeless tobacco: Never Used  Substance Use Topics  . Alcohol use: No  . Drug use: No     Family Hx: The patient's family history includes Arrhythmia in his sister; Hyperlipidemia in his brother.  ROS:   Please see the history of present illness.     All other systems reviewed and are negative.   Prior CV studies:   The following studies  were reviewed today:  None  Labs/Other Tests and Data Reviewed:    EKG:  No ECG reviewed.  Recent Labs: No results found for requested labs within last 8760 hours.   Recent Lipid Panel Lab Results  Component Value Date/Time   CHOL 127 12/12/2017 09:26 AM   TRIG 133 12/12/2017 09:26 AM   HDL 40 12/12/2017 09:26 AM   CHOLHDL 3.2 12/12/2017 09:26 AM   LDLCALC 60 12/12/2017 09:26 AM    Wt Readings from Last 3 Encounters:  03/17/19 168 lb (76.2 kg)  12/11/17 173 lb (78.5 kg)  05/21/16 160 lb (72.6 kg)     Objective:    Vital Signs:  BP  120/80   Pulse (!) 50   Temp 98.4 F (36.9 C)   Ht 6\' 1"  (1.854 m)   Wt 168 lb (76.2 kg)   BMI 22.16 kg/m    VITAL SIGNS:  reviewed GEN:  no acute distress EYES:  sclerae anicteric, EOMI - Extraocular Movements Intact RESPIRATORY:  normal respiratory effort, symmetric expansion CARDIOVASCULAR:  no peripheral edema SKIN:  no rash, lesions or ulcers. MUSCULOSKELETAL:  no obvious deformities. NEURO:  alert and oriented x 3, no obvious focal deficit PSYCH:  normal affect  ASSESSMENT & PLAN:    1.  Chronic atrial fibrillation: Ventricular rate is well controlled on metoprolol and small dose digoxin. He is tolerating anticoagulation with no side effects.  I will arrange for routine labs.  I might consider stopping digoxin in the future.  2. Chronic systolic heart failure: He has no symptoms of volume overload and currently uses furosemide as needed. Continue metoprolol and low-dose losartan.  I requested an echocardiogram to be done in 6 months to evaluate his ejection fraction.  If EF is less than 40%, I would consider switching losartan to Entresto.  3. Small PFO: . Continue to monitor and repeat echo in 6 months  4. Hyperlipidemia: Currently on simvastatin .  Check lipid and liver profile.  COVID-19 Education: The signs and symptoms of COVID-19 were discussed with the patient and how to seek care for testing (follow up with PCP or  arrange E-visit).  The importance of social distancing was discussed today.  Time:   Today, I have spent 20 minutes with the patient with telehealth technology discussing the above problems.     Medication Adjustments/Labs and Tests Ordered: Current medicines are reviewed at length with the patient today.  Concerns regarding medicines are outlined above.   Tests Ordered: No orders of the defined types were placed in this encounter.   Medication Changes: No orders of the defined types were placed in this encounter.   Disposition:  Follow up in 6 month(s)  Signed, Lorine Bears, MD  03/17/2019 1:43 PM    [facility]

## 2019-03-19 ENCOUNTER — Telehealth: Payer: Self-pay | Admitting: Cardiovascular Disease

## 2019-03-19 NOTE — Telephone Encounter (Signed)
Patient calling the office for samples of medication:   1.  What medication and dosage are you requesting samples for? eliquis 5 mg po BID   2.  Are you currently out of this medication?  Not yet

## 2019-03-19 NOTE — Telephone Encounter (Signed)
Please review for refill. Thanks!  

## 2019-03-19 NOTE — Telephone Encounter (Signed)
It appears this patient is requesting Eliquis samples.  We do not have samples in the anticoagulation clinic.  A rx refill for Eliquis was sent to pt's pharmacy [facility] in [place] by Dr Kirke Corin on 03/17/19.  Pt should be able to pick up refill whenever he needs it. Please call pt and advise if your office has samples which he is requesting.  Thanks!

## 2019-03-20 NOTE — Telephone Encounter (Signed)
Patient notified samples available to pick up at the office of Eliquis 5 mg.

## 2019-03-20 NOTE — Telephone Encounter (Signed)
Medication Samples have been left at front desk for patient to pick up. Drug name: Eliquis       Strength: 5 mg        Qty: 2 boxes  LOT: [id number]  Exp.Date: 04/2021   [facility] [place] to call patient.

## 2019-04-23 ENCOUNTER — Telehealth: Payer: Self-pay | Admitting: Cardiovascular Disease

## 2019-04-23 NOTE — Telephone Encounter (Signed)
Patient calling the office for samples of medication: ° ° °1.  What medication and dosage are you requesting samples for?   Eliquis  5 mg po BID  ° ° ° ° °2.  Are you currently out of this medication? yes ° ° °

## 2019-04-23 NOTE — Telephone Encounter (Addendum)
Patient requesting Eliquis 5 mg  Samples are placed up front (2 boxes #28 tablets)Lot #[id number]  Patient notified they are ready.

## 2019-06-16 ENCOUNTER — Encounter: Payer: Self-pay | Admitting: Internal Medicine

## 2019-06-16 ENCOUNTER — Telehealth: Payer: Self-pay

## 2019-06-16 DIAGNOSIS — R5383 Other fatigue: Secondary | ICD-10-CM | POA: Diagnosis not present

## 2019-06-16 DIAGNOSIS — Z1159 Encounter for screening for other viral diseases: Secondary | ICD-10-CM | POA: Diagnosis not present

## 2019-06-16 NOTE — Telephone Encounter (Signed)
TRY TO CALL PT REGARDING MYCHART MESSAGE VOICEMAIL FULL

## 2019-06-16 NOTE — Telephone Encounter (Signed)
Hey. Can you find out what patient wants to do here?

## 2019-06-17 ENCOUNTER — Telehealth: Payer: Self-pay

## 2019-06-17 ENCOUNTER — Ambulatory Visit: Payer: Self-pay | Admitting: Adult Health

## 2019-06-17 DIAGNOSIS — R5383 Other fatigue: Secondary | ICD-10-CM | POA: Diagnosis not present

## 2019-06-17 DIAGNOSIS — T07XXXA Unspecified multiple injuries, initial encounter: Secondary | ICD-10-CM | POA: Diagnosis not present

## 2019-06-17 NOTE — Telephone Encounter (Signed)
SPOKE WITH PT HE WENT WALK IN CLINIC DID COVID TEST AND HE CALLED BACK TO MAKE APPT

## 2019-06-18 ENCOUNTER — Ambulatory Visit: Payer: Self-pay | Admitting: Adult Health

## 2019-06-18 ENCOUNTER — Other Ambulatory Visit: Payer: Self-pay

## 2019-07-14 ENCOUNTER — Other Ambulatory Visit: Payer: Self-pay

## 2019-07-14 ENCOUNTER — Encounter: Payer: Self-pay | Admitting: Nurse Practitioner

## 2019-07-14 ENCOUNTER — Ambulatory Visit (INDEPENDENT_AMBULATORY_CARE_PROVIDER_SITE_OTHER): Payer: Medicare Other | Admitting: Nurse Practitioner

## 2019-07-14 VITALS — BP 124/77 | HR 59 | Resp 16 | Ht 73.0 in | Wt 162.8 lb

## 2019-07-14 DIAGNOSIS — E039 Hypothyroidism, unspecified: Secondary | ICD-10-CM | POA: Insufficient documentation

## 2019-07-14 DIAGNOSIS — I4819 Other persistent atrial fibrillation: Secondary | ICD-10-CM

## 2019-07-14 DIAGNOSIS — G2581 Restless legs syndrome: Secondary | ICD-10-CM

## 2019-07-14 MED ORDER — ROPINIROLE HCL 0.5 MG PO TABS: 0.5000 mg | ORAL_TABLET | ORAL | 3 refills | Status: DC | PRN

## 2019-07-14 MED ORDER — LEVOTHYROXINE SODIUM 75 MCG PO TABS: 75.0000 ug | ORAL_TABLET | Freq: Every day | ORAL | 3 refills | Status: DC

## 2019-07-14 NOTE — Progress Notes (Signed)
[facility] [address] [place], [place] [address]  Internal MEDICINE  Office Visit Note  Patient Name: Jeffery Shaw  [id number]  [id number]  Date of Service: 07/14/2019   Complaints/HPI Pt is here for establishment of PCP. Chief Complaint  Patient presents with  . Establish Care  . Hypertension  . Hyperlipidemia  . Thyroid Problem  . Allergies  . Quality Metric Gaps    PNA VACC   The patient is here to re-establish primary care provider. He does have history of atrial fibrillation. He is now seeing Dr. Kirke CorinArida, cardiology. He was recently seen in Urgent care. Was not feeling well. Had some labs done. Thyroid panel was within normal limits. He continues to be on levothyroxine 75mcg daily. Labs were good. He was negative for COVID 19. He did have tickstuck on his lower back. Has been treated for lyme disease with full round of doxycycline 100mg  twice daily.    Current Medication: Outpatient Encounter Medications as of 07/14/2019  Medication Sig  . apixaban (ELIQUIS) 5 MG TABS tablet Take 1 tablet (5 mg total) by mouth 2 (two) times daily. Need appt w/ Dr. Kirke CorinArida for further refills.  [name]. buPROPion (WELLBUTRIN SR) 100 MG 12 hr tablet Take 100 mg by mouth daily.  . clonazePAM (KLONOPIN) 0.5 MG tablet Take 2.5 mg by mouth 2 (two) times daily as needed for anxiety.  . digoxin (LANOXIN) 0.125 MG tablet Take 1 tablet (125 mcg total) by mouth daily.  . fluticasone (FLONASE) 50 MCG/ACT nasal spray Place 1 spray into both nostrils daily as needed.   . furosemide (LASIX) 20 MG tablet TAKE 1 TABLET BY MOUTH EVERY DAY AS NEEDED  . levothyroxine (SYNTHROID) 75 MCG tablet Take 1 tablet (75 mcg total) by mouth daily.  [name]. losartan (COZAAR) 25 MG tablet Take 1 tablet (25 mg total) by mouth daily.  . metoprolol tartrate (LOPRESSOR) 100 MG tablet Take 1 tablet (100 mg total) by mouth 2 (two) times daily.  . Plant Sterols and Stanols (CHOLEST OFF PO) Take 1 tablet by mouth 2 (two) times  daily.   [name]. rOPINIRole (REQUIP) 0.5 MG tablet Take 1 tablet (0.5 mg total) by mouth as needed.  . sertraline (ZOLOFT) 100 MG tablet Take 50 mg by mouth daily.   . simvastatin (ZOCOR) 20 MG tablet Take 0.5 tablets (10 mg total) by mouth daily. (Patient taking differently: Take 10 mg by mouth. Every third day)  . VITAMIN D, ERGOCALCIFEROL, PO Take 400 Units by mouth daily.  . [DISCONTINUED] levothyroxine (SYNTHROID, LEVOTHROID) 75 MCG tablet Take 75 mcg by mouth daily.  . [DISCONTINUED] rOPINIRole (REQUIP) 0.5 MG tablet Take 0.5 mg by mouth as needed. Reported on 05/21/2016   No facility-administered encounter medications on file as of 07/14/2019.     Surgical History: Past Surgical History:  Procedure Laterality Date  . CARDIAC CATHETERIZATION  07/2012   [facility]  . CARDIOVERSION  09/15/12  . CATARACT EXTRACTION W/PHACO Left 04/17/2016   Procedure: CATARACT EXTRACTION PHACO AND INTRAOCULAR LENS PLACEMENT (IOC);  Surgeon: Galen ManilaWilliam Porfilio, MD;  Location: [facility] ORS;  Service: Ophthalmology;  Laterality: Left;  US 32.9 AP% 20.1 CDE 6.59 FLUID PACK LOT # [id number]  . CATARACT EXTRACTION W/PHACO Right 05/21/2016   Procedure: CATARACT EXTRACTION PHACO AND INTRAOCULAR LENS PLACEMENT (IOC);  Surgeon: Galen ManilaWilliam Porfilio, MD;  Location: [facility] ORS;  Service: Ophthalmology;  Laterality: Right;  US 31.0 AP% 20.4 CDE 6.30 Fluid Pck Lot # [id number] H  . CYSTOSCOPY W/ URETEROSCOPY    .  TEE WITHOUT CARDIOVERSION      Medical History: Past Medical History:  Diagnosis Date  . Asthma   . Chronic atrial fibrillation    a. Rate-controlled; b. CHA2DS2VASc = 2--> Eliquis.  . Chronic systolic heart failure ([place])    a. 07/2012 TEE: EF 30-35%; b. 07/2012 Cath: no significant coronary artery disease with an ejection fraction of 35%. Normal filling pressures with only mild PAH; c. 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 Nl PASP.  [name] Emphysema, unspecified  ([place])   . GERD (gastroesophageal reflux disease)   . Hyperlipidemia   . Hypothyroidism   . Mildly dilated aortic root ([place])    a. 12/2013 Echo: Ao root 77mm.  [name] NICM (nonischemic cardiomyopathy) ([place])    a. 07/2012 TEE: EF 30-35%; b. 07/2012 Cath: nonobs dzs, EF 35%; c. 12/2013 Echo: EF 40-45%.  . Palpitations   . Pancreatitis 1980   due to ETOH  . Panic attacks   . PFO (patent foramen ovale)    a. 2013 TEE & Cath: Tunnelled PFO with a small left-to-right atrial shunt noted on TEE with a QP/QS ratio of 1.37 by cardiac cath; b. 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  . Sleep apnea    CPAP    Family History: Family History  Problem Relation Age of Onset  . Arrhythmia Sister        A-fib/stroke   . Hyperlipidemia Brother     Social History   Socioeconomic History  . Marital status: Married    Spouse name: Not on file  . Number of children: Not on file  . Years of education: Not on file  . Highest education level: Not on file  Occupational History  . Not on file  Social Needs  . Financial resource strain: Not on file  . Food insecurity    Worry: Not on file    Inability: Not on file  . Transportation needs    Medical: Not on file    Non-medical: Not on file  Tobacco Use  . Smoking status: Former Smoker    Packs/day: 0.50    Years: 20.00    Pack years: 10.00    Types: Cigarettes    Quit date: 08/07/2008    Years since quitting: 10.9  . Smokeless tobacco: Never Used  Substance and Sexual Activity  . Alcohol use: No  . Drug use: No  . Sexual activity: Not on file  Lifestyle  . Physical activity    Days per week: Not on file    Minutes per session: Not on file  . Stress: Not on file  Relationships  . Social Herbalist on phone: Not on file    Gets together: Not on file    Attends religious service: Not on file    Active member of club or organization: Not on file    Attends meetings of clubs or organizations: Not on file    Relationship  status: Not on file  . Intimate partner violence    Fear of current or ex partner: Not on file    Emotionally abused: Not on file    Physically abused: Not on file    Forced sexual activity: Not on file  Other Topics Concern  . Not on file  Social History Narrative  . Not on file     Review of Systems  Constitutional: Negative for activity change,  chills, fatigue and unexpected weight change.  HENT: Negative for congestion, postnasal drip, rhinorrhea, sneezing and sore throat.   Respiratory: Negative for cough, chest tightness, shortness of breath and wheezing.   Cardiovascular: Positive for palpitations. Negative for chest pain.       Well controlled blood pressure. Sees Dr. Kirke Corin  Gastrointestinal: Negative for abdominal pain, constipation, diarrhea, nausea and vomiting.  Endocrine: Negative for cold intolerance, heat intolerance, polydipsia and polyuria.       Thyroid panel well controlled.   Musculoskeletal: Negative for arthralgias, back pain, joint swelling and neck pain.  Skin: Negative for rash.  Allergic/Immunologic: Negative for environmental allergies.  Neurological: Negative for dizziness, tremors, numbness and headaches.  Hematological: Negative for adenopathy. Does not bruise/bleed easily.  Psychiatric/Behavioral: Positive for dysphoric mood. Negative for behavioral problems (Depression), sleep disturbance and suicidal ideas. The patient is nervous/anxious.        Sees psychiatrist on regular basis.    Today's Vitals   07/14/19 1400  BP: 124/77  Pulse: (!) 59  Resp: 16  SpO2: 99%  Weight: 162 lb 12.8 oz (73.8 kg)  Height: 6\' 1"  (1.854 m)   Body mass index is 21.48 kg/m.  Physical Exam Vitals signs and nursing note reviewed.  Constitutional:      General: He is not in acute distress.    Appearance: Normal appearance. He is well-developed. He is not diaphoretic.  HENT:     Head: Normocephalic and atraumatic.     Mouth/Throat:     Pharynx: No oropharyngeal  exudate.  Eyes:     Pupils: Pupils are equal, round, and reactive to light.  Neck:     Musculoskeletal: Normal range of motion and neck supple.     Thyroid: No thyromegaly.     Vascular: No JVD.     Trachea: No tracheal deviation.  Cardiovascular:     Rate and Rhythm: Normal rate. Rhythm irregular.     Heart sounds: Normal heart sounds. No murmur. No friction rub. No gallop.   Pulmonary:     Effort: Pulmonary effort is normal. No respiratory distress.     Breath sounds: Normal breath sounds. No wheezing or rales.  Chest:     Chest wall: No tenderness.  Abdominal:     General: Bowel sounds are normal.     Palpations: Abdomen is soft.  Musculoskeletal: Normal range of motion.  Lymphadenopathy:     Cervical: No cervical adenopathy.  Skin:    General: Skin is warm and dry.  Neurological:     Mental Status: He is alert and oriented to person, place, and time.     Cranial Nerves: No cranial nerve deficit.  Psychiatric:        Behavior: Behavior normal.        Thought Content: Thought content normal.        Judgment: Judgment normal.   Assessment/Plan: 1. Acquired hypothyroidism Most recent thyroid panel stable. Continue levothyroxine as prescribed. Refills sent to pharmacy - levothyroxine (SYNTHROID) 75 MCG tablet; Take 1 tablet (75 mcg total) by mouth daily.  Dispense: 30 tablet; Refill: 3  2. Restless leg syndrome Continue ropinirole at bedtime. Refills sent to his pharmacy - rOPINIRole (REQUIP) 0.5 MG tablet; Take 1 tablet (0.5 mg total) by mouth as needed.  Dispense: 30 tablet; Refill: 3  3. Other persistent atrial fibrillation Continue regular visits with cardiology as scheduled.   General Counseling: kou bellanti understanding of the findings of todays visit and agrees with plan of treatment. I have  discussed any further diagnostic evaluation that may be needed or ordered today. We also reviewed his medications today. he has been encouraged to call the office with any  questions or concerns that should arise related to todays visit.    Counseling:  This patient was seen by Vincent GrosHeather Alyce Inscore FNP Collaboration with Dr Lyndon CodeFozia M Khan as a part of collaborative care agreement  Meds ordered this encounter  Medications  . levothyroxine (SYNTHROID) 75 MCG tablet    Sig: Take 1 tablet (75 mcg total) by mouth daily.    Dispense:  30 tablet    Refill:  3    Order Specific Question:   Supervising Provider    Answer:   Lyndon CodeKHAN, FOZIA M [[id number]]  . rOPINIRole (REQUIP) 0.5 MG tablet    Sig: Take 1 tablet (0.5 mg total) by mouth as needed.    Dispense:  30 tablet    Refill:  3    Order Specific Question:   Supervising Provider    Answer:   Lyndon CodeKHAN, FOZIA M [[id number]]    Time spent: 30 Minutes

## 2019-07-15 ENCOUNTER — Encounter: Payer: Self-pay | Admitting: Nurse Practitioner

## 2019-07-29 ENCOUNTER — Telehealth: Payer: Self-pay

## 2019-07-29 NOTE — Telephone Encounter (Signed)
Talked with Mickel Fuchs, Adult protective services. The patent had been "scammed" by bank or financial institution of some sort. There was concern about dementia or other metabolic diseases which could lead to dementia. Patient was seen, prior to his most recent visit here, in urgent care was treated with doxycycline to prevent/treat Lyme disease. He did not exhibit any signs/symptoms of dementia or memory issues at the time of his visit. Will contact Mickel Fuchs again if concern does arise for this patient. Ms. Jefferson Fuel can be reached at [phone].  Leretha Pol, MSN, FNP-c

## 2019-08-05 ENCOUNTER — Telehealth: Payer: Self-pay

## 2019-08-05 NOTE — Telephone Encounter (Signed)
RECORD REQUEST FROM [employer] COMPLETED AND FAXED 08-05-19.

## 2019-08-15 DIAGNOSIS — Z23 Encounter for immunization: Secondary | ICD-10-CM | POA: Diagnosis not present

## 2019-09-24 ENCOUNTER — Telehealth: Payer: Self-pay

## 2019-09-24 MED ORDER — LOSARTAN POTASSIUM 25 MG PO TABS: 25.0000 mg | ORAL_TABLET | Freq: Every day | ORAL | 0 refills | Status: DC

## 2019-09-24 NOTE — Telephone Encounter (Signed)
Requested Prescriptions   Signed Prescriptions Disp Refills  . losartan (COZAAR) 25 MG tablet 30 tablet 0    Sig: Take 1 tablet (25 mg total) by mouth daily. *NEEDS OFFICE VISIT FOR FURTHER REFILLS-PLEASE CALL [phone] TO SCHEDULE.*    Authorizing Provider: Kathlyn Sacramento A    Ordering User: Raelene Bott, Walida Cajas L

## 2019-09-28 ENCOUNTER — Other Ambulatory Visit: Payer: Self-pay | Admitting: *Deleted

## 2019-09-28 MED ORDER — LOSARTAN POTASSIUM 25 MG PO TABS: 25.0000 mg | ORAL_TABLET | Freq: Every day | ORAL | 0 refills | Status: DC

## 2019-09-30 ENCOUNTER — Encounter: Payer: Self-pay | Admitting: Nurse Practitioner

## 2019-10-14 ENCOUNTER — Telehealth: Payer: Self-pay

## 2019-10-14 ENCOUNTER — Other Ambulatory Visit: Payer: Self-pay

## 2019-10-14 NOTE — Telephone Encounter (Signed)
ATTEMPTED TO CONTACT PATIENT TO CONFIRM AND SCREEN FOR 10-20-19 OV THERE WAS NO VM SET UP.

## 2019-10-19 ENCOUNTER — Telehealth: Payer: Self-pay

## 2019-10-19 NOTE — Telephone Encounter (Signed)
CONFIRMED 10-20-19 APPOINTMENT AS VIRTUAL. °

## 2019-10-20 ENCOUNTER — Ambulatory Visit: Payer: Medicare Other | Admitting: Nurse Practitioner

## 2019-11-03 ENCOUNTER — Telehealth: Payer: Self-pay | Admitting: Cardiovascular Disease

## 2019-11-03 NOTE — Telephone Encounter (Signed)
Patient wants to get in line to be called when covid vaccine available due to heart condition.

## 2019-11-03 NOTE — Telephone Encounter (Signed)
Returned the patient's call. Advised the patient that we will no be administering the COVID vaccine. Recommended that the patient contact his pcp regarding possible COVID vaccine administration. Patient voiced appreciation for the call back and the information.

## 2019-11-05 ENCOUNTER — Other Ambulatory Visit: Payer: Self-pay

## 2019-11-05 MED ORDER — APIXABAN 5 MG PO TABS: 5.0000 mg | ORAL_TABLET | Freq: Two times a day (BID) | ORAL | 3 refills | Status: DC

## 2019-11-05 NOTE — Addendum Note (Signed)
Addended by: Allean Found on: 11/05/2019 11:03 AM   Modules accepted: Orders

## 2019-11-05 NOTE — Telephone Encounter (Signed)
*  STAT* If patient is at the pharmacy, call can be transferred to refill team.   1. Which medications need to be refilled? (please list name of each medication and dose if known) Eliquis  2. Which pharmacy/location (including street and city if local pharmacy) is medication to be sent to? [place], [place]  3. Do they need a 30 day or 90 day supply? [place]

## 2019-11-26 ENCOUNTER — Other Ambulatory Visit: Payer: Self-pay

## 2019-11-26 ENCOUNTER — Encounter: Payer: Self-pay | Admitting: Nurse Practitioner

## 2019-11-26 ENCOUNTER — Ambulatory Visit (INDEPENDENT_AMBULATORY_CARE_PROVIDER_SITE_OTHER): Payer: Medicare Other | Admitting: Nurse Practitioner

## 2019-11-26 VITALS — Ht 73.0 in

## 2019-11-26 DIAGNOSIS — Z0001 Encounter for general adult medical examination with abnormal findings: Secondary | ICD-10-CM | POA: Diagnosis not present

## 2019-11-26 DIAGNOSIS — I4891 Unspecified atrial fibrillation: Secondary | ICD-10-CM

## 2019-11-26 DIAGNOSIS — Z1159 Encounter for screening for other viral diseases: Secondary | ICD-10-CM

## 2019-11-26 DIAGNOSIS — G2581 Restless legs syndrome: Secondary | ICD-10-CM

## 2019-11-26 DIAGNOSIS — Z23 Encounter for immunization: Secondary | ICD-10-CM

## 2019-11-26 DIAGNOSIS — E039 Hypothyroidism, unspecified: Secondary | ICD-10-CM | POA: Diagnosis not present

## 2019-11-26 DIAGNOSIS — Z1211 Encounter for screening for malignant neoplasm of colon: Secondary | ICD-10-CM

## 2019-11-26 MED ORDER — ROPINIROLE HCL 0.5 MG PO TABS: 0.5000 mg | ORAL_TABLET | ORAL | 3 refills | Status: DC | PRN

## 2019-11-26 MED ORDER — PNEUMOVAX 23 25 MCG/0.5ML IJ INJ: INJECTION | INTRAMUSCULAR | 0 refills | Status: DC

## 2019-11-26 MED ORDER — LEVOTHYROXINE SODIUM 75 MCG PO TABS: 75.0000 ug | ORAL_TABLET | Freq: Every day | ORAL | 3 refills | Status: DC

## 2019-11-26 NOTE — Progress Notes (Signed)
[facility] [address] [place], [place] [address]  Internal MEDICINE  Telephone Visit  Patient Name: Jeffery Shaw  [id number]  [id number]  Date of Service: 11/29/2019  I connected with the patient at 12:19pm by telephone and verified the patients identity using two identifiers.   I discussed the limitations, risks, security and privacy concerns of performing an evaluation and management service by telephone and the availability of in person appointments. I also discussed with the patient that there may be a patient responsible charge related to the service.  The patient expressed understanding and agrees to proceed.    Chief Complaint  Patient presents with  . Medicare Wellness  . Hypothyroidism  . Hyperlipidemia  . Gastroesophageal Reflux  . Medication Refill    levothyroxine and ropinrole    The patient has been contacted via telephone for follow up visit due to concerns for spread of novel coronavirus. The patient states that he is doing well. Continues to see psychiatrist. Will begin to see counselor on Saturday. He is due to have routine, fasting labs. He is also due to have a screening colonoscopy.       Current Medication: Outpatient Encounter Medications as of 11/26/2019  Medication Sig  . apixaban (ELIQUIS) 5 MG TABS tablet Take 1 tablet (5 mg total) by mouth 2 (two) times daily. Need appt w/ Dr. Kirke Corin for further refills.  [name] buPROPion (WELLBUTRIN SR) 100 MG 12 hr tablet Take 100 mg by mouth daily.  . clonazePAM (KLONOPIN) 0.5 MG tablet Take 2.5 mg by mouth 2 (two) times daily as needed for anxiety.  . digoxin (LANOXIN) 0.125 MG tablet Take 1 tablet (125 mcg total) by mouth daily.  . fluticasone (FLONASE) 50 MCG/ACT nasal spray Place 1 spray into both nostrils daily as needed.   . furosemide (LASIX) 20 MG tablet TAKE 1 TABLET BY MOUTH EVERY DAY AS NEEDED  . levothyroxine (SYNTHROID) 75 MCG tablet Take 1 tablet (75 mcg total) by mouth daily.  [name] losartan  (COZAAR) 25 MG tablet Take 1 tablet (25 mg total) by mouth daily. *NEEDS OFFICE VISIT FOR FURTHER REFILLS-PLEASE CALL [phone] TO SCHEDULE.*  . metoprolol tartrate (LOPRESSOR) 100 MG tablet Take 1 tablet (100 mg total) by mouth 2 (two) times daily.  . Plant Sterols and Stanols (CHOLEST OFF PO) Take 1 tablet by mouth 2 (two) times daily.   [name] rOPINIRole (REQUIP) 0.5 MG tablet Take 1 tablet (0.5 mg total) by mouth as needed.  . sertraline (ZOLOFT) 100 MG tablet Take 50 mg by mouth daily.   . simvastatin (ZOCOR) 20 MG tablet Take 0.5 tablets (10 mg total) by mouth daily. (Patient taking differently: Take 10 mg by mouth. Every third day)  . VITAMIN D, ERGOCALCIFEROL, PO Take 400 Units by mouth daily.  . [DISCONTINUED] levothyroxine (SYNTHROID) 75 MCG tablet Take 1 tablet (75 mcg total) by mouth daily.  . [DISCONTINUED] rOPINIRole (REQUIP) 0.5 MG tablet Take 1 tablet (0.5 mg total) by mouth as needed.  . pneumococcal 23 valent vaccine (PNEUMOVAX 23) 25 MCG/0.5ML injection Inject 0.75ml IM once   No facility-administered encounter medications on file as of 11/26/2019.    Surgical History: Past Surgical History:  Procedure Laterality Date  . CARDIAC CATHETERIZATION  07/2012   [facility]  . CARDIOVERSION  09/15/12  . CATARACT EXTRACTION W/PHACO Left 04/17/2016   Procedure: CATARACT EXTRACTION PHACO AND INTRAOCULAR LENS PLACEMENT (IOC);  Surgeon: Galen Manila, MD;  Location: [facility] ORS;  Service: Ophthalmology;  Laterality: Left;  [place] 32.9  AP% 20.1 CDE 6.59 FLUID PACK LOT # [id number]  . CATARACT EXTRACTION W/PHACO Right 05/21/2016   Procedure: CATARACT EXTRACTION PHACO AND INTRAOCULAR LENS PLACEMENT (IOC);  Surgeon: Birder Robson, MD;  Location: [facility] ORS;  Service: Ophthalmology;  Laterality: Right;  [place] 31.0 AP% 20.4 CDE 6.30 Fluid Pck Lot # [id number] H  . CYSTOSCOPY W/ URETEROSCOPY    . TEE WITHOUT CARDIOVERSION      Medical History: Past Medical History:  Diagnosis Date  . Asthma   . Chronic  atrial fibrillation (HCC)    a. Rate-controlled; b. CHA2DS2VASc = 2--> Eliquis.  . Chronic systolic heart failure ([place])    a. 07/2012 TEE: EF 30-35%; b. 07/2012 Cath: no significant coronary artery disease with an ejection fraction of 35%. Normal filling pressures with only mild PAH; c. 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 Nl PASP.  [name] Emphysema, unspecified ([place])   . GERD (gastroesophageal reflux disease)   . Hyperlipidemia   . Hypothyroidism   . Mildly dilated aortic root ([place])    a. 12/2013 Echo: Ao root 62mm.  [name] NICM (nonischemic cardiomyopathy) ([place])    a. 07/2012 TEE: EF 30-35%; b. 07/2012 Cath: nonobs dzs, EF 35%; c. 12/2013 Echo: EF 40-45%.  . Palpitations   . Pancreatitis 1980   due to ETOH  . Panic attacks   . PFO (patent foramen ovale)    a. 2013 TEE & Cath: Tunnelled PFO with a small left-to-right atrial shunt noted on TEE with a QP/QS ratio of 1.37 by cardiac cath; b. 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  . Sleep apnea    CPAP    Family History: Family History  Problem Relation Age of Onset  . Arrhythmia Sister        A-fib/stroke   . Hyperlipidemia Brother     Social History   Socioeconomic History  . Marital status: Married    Spouse name: Not on file  . Number of children: Not on file  . Years of education: Not on file  . Highest education level: Not on file  Occupational History  . Not on file  Tobacco Use  . Smoking status: Former Smoker    Packs/day: 0.50    Years: 20.00    Pack years: 10.00    Types: Cigarettes    Quit date: 08/07/2008    Years since quitting: 11.3  . Smokeless tobacco: Never Used  Substance and Sexual Activity  . Alcohol use: No  . Drug use: No  . Sexual activity: Not on file  Other Topics Concern  . Not on file  Social History Narrative  . Not on file   Social Determinants of Health   Financial Resource Strain:   . Difficulty of Paying  Living Expenses: Not on file  Food Insecurity:   . Worried About Charity fundraiser in the Last Year: Not on file  . Ran Out of Food in the Last Year: Not on file  Transportation Needs:   . Lack of Transportation (Medical): Not on file  . Lack of Transportation (Non-Medical): Not on file  Physical Activity:   . Days of Exercise per Week: Not on file  . Minutes of Exercise per Session: Not on file  Stress:   . Feeling of Stress : Not on file  Social Connections:   . Frequency of Communication with Friends and Family: Not on file  . Frequency of  Social Gatherings with Friends and Family: Not on file  . Attends Religious Services: Not on file  . Active Member of Clubs or Organizations: Not on file  . Attends Banker Meetings: Not on file  . Marital Status: Not on file  Intimate Partner Violence:   . Fear of Current or Ex-Partner: Not on file  . Emotionally Abused: Not on file  . Physically Abused: Not on file  . Sexually Abused: Not on file      Review of Systems  Constitutional: Negative for activity change, chills, fatigue and unexpected weight change.  HENT: Negative for congestion, postnasal drip, rhinorrhea, sneezing and sore throat.   Respiratory: Negative for cough, chest tightness, shortness of breath and wheezing.   Cardiovascular: Negative for chest pain and palpitations.       The patient sees Dr. Kirke Corin, cardiology   Gastrointestinal: Negative for abdominal pain, constipation, diarrhea, nausea and vomiting.  Endocrine: Negative for cold intolerance, heat intolerance, polydipsia and polyuria.       The patient has well managed thyroid disease.   Genitourinary: Negative for dysuria and frequency.  Musculoskeletal: Negative for arthralgias, back pain, joint swelling and neck pain.  Skin: Negative for rash.  Allergic/Immunologic: Negative for environmental allergies.  Neurological: Negative for dizziness, tremors, numbness and headaches.  Hematological:  Negative for adenopathy. Does not bruise/bleed easily.  Psychiatric/Behavioral: Positive for dysphoric mood. Negative for behavioral problems (Depression), sleep disturbance and suicidal ideas. The patient is not nervous/anxious.        Sees psychiatrist on regular basis.     Today's Vitals   11/26/19 1156  Height: 6\' 1"  (1.854 m)   Body mass index is 21.48 kg/m.  Observation/Objective:  The patient is alert and oriented. He is pleasant and answering all questions appropriately. Breathing is non-labored. He is in no acute distress.    Assessment/Plan:  1. Encounter for general adult medical examination with abnormal findings Annual health maintenance exam today.   2. Acquired hypothyroidism Check thyroid panel with labs and adjust levothyroxine dose as indicated.  - levothyroxine (SYNTHROID) 75 MCG tablet; Take 1 tablet (75 mcg total) by mouth daily.  Dispense: 30 tablet; Refill: 3  3. Atrial fibrillation, unspecified type (HCC) Stable. Continue regular visits with cardiology as scheduled.   4. Restless leg syndrome Continue requip 0.5mg  daily as needed  - rOPINIRole (REQUIP) 0.5 MG tablet; Take 1 tablet (0.5 mg total) by mouth as needed.  Dispense: 30 tablet; Refill: 3  5. Screening for colon cancer Refer to GI for screening colonoscopy.  - Ambulatory referral to Gastroenterology  6. Encounter for hepatitis C screening test for low risk patient Screen for hepatitis C with routine, fasting labs.   7. Need for vaccination against Streptococcus pneumoniae using pneumococcal conjugate vaccine 7 Prescription for pneumovax sent to his pharmacy for administration. - pneumococcal 23 valent vaccine (PNEUMOVAX 23) 25 MCG/0.5ML injection; Inject 0.63ml IM once  Dispense: 0.5 mL; Refill: 0  General Counseling: ysmael hires understanding of the findings of today's phone visit and agrees with plan of treatment. I have discussed any further diagnostic evaluation that may be needed or  ordered today. We also reviewed his medications today. he has been encouraged to call the office with any questions or concerns that should arise related to todays visit.  This patient was seen by Vito Backers FNP Collaboration with Dr Vincent Gros as a part of collaborative care agreement  Orders Placed This Encounter  Procedures  . Ambulatory referral to Gastroenterology  Meds ordered this encounter  Medications  . pneumococcal 23 valent vaccine (PNEUMOVAX 23) 25 MCG/0.5ML injection    Sig: Inject 0.57ml IM once    Dispense:  0.5 mL    Refill:  0    Order Specific Question:   Supervising Provider    Answer:   Lyndon Code [[id number]]  . rOPINIRole (REQUIP) 0.5 MG tablet    Sig: Take 1 tablet (0.5 mg total) by mouth as needed.    Dispense:  30 tablet    Refill:  3    Order Specific Question:   Supervising Provider    Answer:   Lyndon Code [[id number]]  . levothyroxine (SYNTHROID) 75 MCG tablet    Sig: Take 1 tablet (75 mcg total) by mouth daily.    Dispense:  30 tablet    Refill:  3    Order Specific Question:   Supervising Provider    Answer:   Lyndon Code [[id number]]    Time spent: 17 Minutes    Dr Lyndon Code Internal medicine

## 2019-11-29 DIAGNOSIS — Z23 Encounter for immunization: Secondary | ICD-10-CM | POA: Insufficient documentation

## 2019-11-29 DIAGNOSIS — Z0001 Encounter for general adult medical examination with abnormal findings: Secondary | ICD-10-CM | POA: Insufficient documentation

## 2019-11-29 DIAGNOSIS — Z1211 Encounter for screening for malignant neoplasm of colon: Secondary | ICD-10-CM | POA: Insufficient documentation

## 2019-11-29 DIAGNOSIS — Z1159 Encounter for screening for other viral diseases: Secondary | ICD-10-CM | POA: Insufficient documentation

## 2019-12-04 ENCOUNTER — Telehealth: Payer: Self-pay

## 2019-12-04 NOTE — Telephone Encounter (Signed)
Because they were done in July, 2020. Lab slip is for him to do labs about a week before his next visit, which is July, 2021. Will review labs at visit.

## 2019-12-07 ENCOUNTER — Encounter: Payer: Self-pay | Admitting: *Deleted

## 2019-12-07 NOTE — Telephone Encounter (Signed)
PT WAS NOTIFIED. 

## 2019-12-11 ENCOUNTER — Other Ambulatory Visit: Payer: Self-pay | Admitting: Nurse Practitioner

## 2019-12-12 LAB — CBC
Hematocrit: 44.6 % (ref 37.5–51.0)
Hemoglobin: 16.1 g/dL (ref 13.0–17.7)
MCH: 34.3 pg — ABNORMAL HIGH (ref 26.6–33.0)
MCHC: 36.1 g/dL — ABNORMAL HIGH (ref 31.5–35.7)
MCV: 95 fL (ref 79–97)
Platelets: 312 10*3/uL (ref 150–450)
RBC: 4.69 x10E6/uL (ref 4.14–5.80)
RDW: 13.4 % (ref 11.6–15.4)
WBC: 9.9 10*3/uL (ref 3.4–10.8)

## 2019-12-12 LAB — COMPREHENSIVE METABOLIC PANEL
ALT: 25 IU/L (ref 0–44)
AST: 20 IU/L (ref 0–40)
Albumin/Globulin Ratio: 1.9 (ref 1.2–2.2)
Albumin: 4.2 g/dL (ref 3.7–4.7)
Alkaline Phosphatase: 49 IU/L (ref 39–117)
BUN/Creatinine Ratio: 20 (ref 10–24)
BUN: 21 mg/dL (ref 8–27)
Bilirubin Total: 1.2 mg/dL (ref 0.0–1.2)
CO2: 28 mmol/L (ref 20–29)
Calcium: 9.3 mg/dL (ref 8.6–10.2)
Chloride: 99 mmol/L (ref 96–106)
Creatinine, Ser: 1.05 mg/dL (ref 0.76–1.27)
GFR calc Af Amer: 82 mL/min/{1.73_m2} (ref >59)
GFR calc non Af Amer: 71 mL/min/{1.73_m2} (ref >59)
Globulin, Total: 2.2 g/dL (ref 1.5–4.5)
Glucose: 115 mg/dL — ABNORMAL HIGH (ref 65–99)
Potassium: 4.6 mmol/L (ref 3.5–5.2)
Sodium: 140 mmol/L (ref 134–144)
Total Protein: 6.4 g/dL (ref 6.0–8.5)

## 2019-12-12 LAB — LIPID PANEL W/O CHOL/HDL RATIO
Cholesterol, Total: 178 mg/dL (ref 100–199)
HDL: 56 mg/dL (ref >39)
LDL Chol Calc (NIH): 108 mg/dL — ABNORMAL HIGH (ref 0–99)
Triglycerides: 77 mg/dL (ref 0–149)
VLDL Cholesterol Cal: 14 mg/dL (ref 5–40)

## 2019-12-12 LAB — HCV AB W REFLEX TO QUANT PCR: HCV Ab: 0.1 s/co ratio (ref 0.0–0.9)

## 2019-12-12 LAB — HCV INTERPRETATION

## 2019-12-12 LAB — PSA: Prostate Specific Ag, Serum: 2.1 ng/mL (ref 0.0–4.0)

## 2019-12-12 LAB — TSH: TSH: 2.48 u[IU]/mL (ref 0.450–4.500)

## 2019-12-12 LAB — T4, FREE: Free T4: 1.26 ng/dL (ref 0.82–1.77)

## 2019-12-12 LAB — HGB A1C W/O EAG: Hgb A1c MFr Bld: 5.5 % (ref 4.8–5.6)

## 2019-12-15 NOTE — Progress Notes (Signed)
Labs are good. Has access through MyChart

## 2019-12-16 ENCOUNTER — Ambulatory Visit (INDEPENDENT_AMBULATORY_CARE_PROVIDER_SITE_OTHER): Payer: Medicare Other | Admitting: Adult Health

## 2019-12-16 ENCOUNTER — Other Ambulatory Visit: Payer: Self-pay | Admitting: Adult Health

## 2019-12-16 ENCOUNTER — Other Ambulatory Visit: Payer: Self-pay

## 2019-12-16 ENCOUNTER — Encounter: Payer: Self-pay | Admitting: Adult Health

## 2019-12-16 VITALS — Ht 73.0 in

## 2019-12-16 DIAGNOSIS — R0981 Nasal congestion: Secondary | ICD-10-CM

## 2019-12-16 DIAGNOSIS — J0110 Acute frontal sinusitis, unspecified: Secondary | ICD-10-CM

## 2019-12-16 MED ORDER — SULFAMETHOXAZOLE-TRIMETHOPRIM 400-80 MG PO TABS: 1.0000 | ORAL_TABLET | Freq: Two times a day (BID) | ORAL | 0 refills | Status: DC

## 2019-12-16 MED ORDER — FLUTICASONE PROPIONATE 50 MCG/ACT NA SUSP: 2.0000 | Freq: Two times a day (BID) | NASAL | 2 refills | Status: DC

## 2019-12-16 NOTE — Progress Notes (Signed)
[facility] [address] [place], [place] [address]  Internal MEDICINE  Telephone Visit  Patient Name: Jeffery Shaw  [id number]  [id number]  Date of Service: 12/16/2019  I connected with the patient at [phone] by telephone and verified the patients identity using two identifiers.   I discussed the limitations, risks, security and privacy concerns of performing an evaluation and management service by telephone and the availability of in person appointments. I also discussed with the patient that there may be a patient responsible charge related to the service.  The patient expressed understanding and agrees to proceed.    Chief Complaint  Patient presents with  . Telephone Assessment  . Telephone Screen  . Sinusitis    SINUS PRESSURE, YELLOW MUCOUS, USES HUMIDFIER HELPS    HPI  Pt is is seen via telephone.  He reports over 7 days of sinus pressure and pain.  He reports some yellow mucous. He has been having PND, but denies fever.  No sore throat. He was on allergy shot, but was taking a break at this time.  He reports serious congestion nasally in the morning. He has been using a humidifier with some relief.     Current Medication: Outpatient Encounter Medications as of 12/16/2019  Medication Sig  . apixaban (ELIQUIS) 5 MG TABS tablet Take 1 tablet (5 mg total) by mouth 2 (two) times daily. Need appt w/ Dr. Kirke Corin for further refills.  [name] buPROPion (WELLBUTRIN SR) 100 MG 12 hr tablet Take 100 mg by mouth daily.  . clonazePAM (KLONOPIN) 0.5 MG tablet Take 2.5 mg by mouth 2 (two) times daily as needed for anxiety.  . digoxin (LANOXIN) 0.125 MG tablet Take 1 tablet (125 mcg total) by mouth daily.  . fluticasone (FLONASE) 50 MCG/ACT nasal spray Place 2 sprays into both nostrils 2 (two) times daily.  . furosemide (LASIX) 20 MG tablet TAKE 1 TABLET BY MOUTH EVERY DAY AS NEEDED  . levothyroxine (SYNTHROID) 75 MCG tablet Take 1 tablet (75 mcg total) by mouth daily.  [name] losartan  (COZAAR) 25 MG tablet Take 1 tablet (25 mg total) by mouth daily. *NEEDS OFFICE VISIT FOR FURTHER REFILLS-PLEASE CALL [phone] TO SCHEDULE.*  . metoprolol tartrate (LOPRESSOR) 100 MG tablet Take 1 tablet (100 mg total) by mouth 2 (two) times daily.  . Plant Sterols and Stanols (CHOLEST OFF PO) Take 1 tablet by mouth 2 (two) times daily.   . pneumococcal 23 valent vaccine (PNEUMOVAX 23) 25 MCG/0.5ML injection Inject 0.26ml IM once  . rOPINIRole (REQUIP) 0.5 MG tablet Take 1 tablet (0.5 mg total) by mouth as needed.  . sertraline (ZOLOFT) 100 MG tablet Take 50 mg by mouth daily.   . simvastatin (ZOCOR) 20 MG tablet Take 0.5 tablets (10 mg total) by mouth daily. (Patient taking differently: Take 10 mg by mouth. Every third day)  . VITAMIN D, ERGOCALCIFEROL, PO Take 400 Units by mouth daily.  . [DISCONTINUED] fluticasone (FLONASE) 50 MCG/ACT nasal spray Place 1 spray into both nostrils daily as needed.   . sulfamethoxazole-trimethoprim (BACTRIM) 400-80 MG tablet Take 1 tablet by mouth 2 (two) times daily.   No facility-administered encounter medications on file as of 12/16/2019.    Surgical History: Past Surgical History:  Procedure Laterality Date  . CARDIAC CATHETERIZATION  07/2012   [facility]  . CARDIOVERSION  09/15/12  . CATARACT EXTRACTION W/PHACO Left 04/17/2016   Procedure: CATARACT EXTRACTION PHACO AND INTRAOCULAR LENS PLACEMENT (IOC);  Surgeon: Galen Manila, MD;  Location: [facility] ORS;  Service: Ophthalmology;  Laterality: Left;  [place] 32.9 AP% 20.1 CDE 6.59 FLUID PACK LOT # [id number]  . CATARACT EXTRACTION W/PHACO Right 05/21/2016   Procedure: CATARACT EXTRACTION PHACO AND INTRAOCULAR LENS PLACEMENT (IOC);  Surgeon: Galen Manila, MD;  Location: [facility] ORS;  Service: Ophthalmology;  Laterality: Right;  [place] 31.0 AP% 20.4 CDE 6.30 Fluid Pck Lot # [id number] H  . CYSTOSCOPY W/ URETEROSCOPY    . TEE WITHOUT CARDIOVERSION      Medical History: Past Medical History:  Diagnosis Date  . Asthma    . Chronic atrial fibrillation (HCC)    a. Rate-controlled; b. CHA2DS2VASc = 2--> Eliquis.  . Chronic systolic heart failure (HCC)    a. 07/2012 TEE: EF 30-35%; b. 07/2012 Cath: no significant coronary artery disease with an ejection fraction of 35%. Normal filling pressures with only mild PAH; c. 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 Nl PASP.  [name] Emphysema, unspecified (HCC)   . GERD (gastroesophageal reflux disease)   . Hyperlipidemia   . Hypothyroidism   . Mildly dilated aortic root (HCC)    a. 12/2013 Echo: Ao root 61mm.  [name] NICM (nonischemic cardiomyopathy) (HCC)    a. 07/2012 TEE: EF 30-35%; b. 07/2012 Cath: nonobs dzs, EF 35%; c. 12/2013 Echo: EF 40-45%.  . Palpitations   . Pancreatitis 1980   due to ETOH  . Panic attacks   . PFO (patent foramen ovale)    a. 2013 TEE & Cath: Tunnelled PFO with a small left-to-right atrial shunt noted on TEE with a QP/QS ratio of 1.37 by cardiac cath; b. 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  . Sleep apnea    CPAP    Family History: Family History  Problem Relation Age of Onset  . Arrhythmia Sister        A-fib/stroke   . Hyperlipidemia Brother     Social History   Socioeconomic History  . Marital status: Married    Spouse name: Not on file  . Number of children: Not on file  . Years of education: Not on file  . Highest education level: Not on file  Occupational History  . Not on file  Tobacco Use  . Smoking status: Former Smoker    Packs/day: 0.50    Years: 20.00    Pack years: 10.00    Types: Cigarettes    Quit date: 08/07/2008    Years since quitting: 11.3  . Smokeless tobacco: Never Used  Substance and Sexual Activity  . Alcohol use: No  . Drug use: No  . Sexual activity: Not on file  Other Topics Concern  . Not on file  Social History Narrative  . Not on file   Social Determinants of Health   Financial Resource Strain:   .  Difficulty of Paying Living Expenses: Not on file  Food Insecurity:   . Worried About Programme researcher, broadcasting/film/video in the Last Year: Not on file  . Ran Out of Food in the Last Year: Not on file  Transportation Needs:   . Lack of Transportation (Medical): Not on file  . Lack of Transportation (Non-Medical): Not on file  Physical Activity:   . Days of Exercise per Week: Not on file  . Minutes of Exercise per Session: Not on file  Stress:   . Feeling of Stress : Not on file  Social Connections:   . Frequency of Communication with Friends and  Family: Not on file  . Frequency of Social Gatherings with Friends and Family: Not on file  . Attends Religious Services: Not on file  . Active Member of Clubs or Organizations: Not on file  . Attends Archivist Meetings: Not on file  . Marital Status: Not on file  Intimate Partner Violence:   . Fear of Current or Ex-Partner: Not on file  . Emotionally Abused: Not on file  . Physically Abused: Not on file  . Sexually Abused: Not on file      Review of Systems  Constitutional: Negative.  Negative for chills, fatigue and unexpected weight change.  HENT: Positive for postnasal drip, rhinorrhea and sinus pressure. Negative for congestion, sneezing and sore throat.   Eyes: Negative for redness.  Respiratory: Negative.  Negative for cough, chest tightness and shortness of breath.   Cardiovascular: Negative.  Negative for chest pain and palpitations.  Gastrointestinal: Negative.  Negative for abdominal pain, constipation, diarrhea, nausea and vomiting.  Endocrine: Negative.   Genitourinary: Negative.  Negative for dysuria and frequency.  Musculoskeletal: Negative.  Negative for arthralgias, back pain, joint swelling and neck pain.  Skin: Negative.  Negative for rash.  Allergic/Immunologic: Negative.   Neurological: Negative.  Negative for tremors and numbness.  Hematological: Negative for adenopathy. Does not bruise/bleed easily.   Psychiatric/Behavioral: Negative.  Negative for behavioral problems, sleep disturbance and suicidal ideas. The patient is not nervous/anxious.     Vital Signs: Ht 6\' 1"  (1.854 m)   BMI 21.48 kg/m    Observation/Objective:  Well sounding, NAD noted.    Assessment/Plan: 1. Subacute frontal sinusitis Advised patient to take entire course of antibiotics as prescribed with food. Pt should return to clinic in 7-10 days if symptoms fail to improve or new symptoms develop.  - sulfamethoxazole-trimethoprim (BACTRIM) 400-80 MG tablet; Take 1 tablet by mouth 2 (two) times daily.  Dispense: 20 tablet; Refill: 0  2. Sinus congestion Use Flonase as discussed.  - fluticasone (FLONASE) 50 MCG/ACT nasal spray; Place 2 sprays into both nostrils 2 (two) times daily.  Dispense: 16 g; Refill: 2  General Counseling: Mylin verbalizes understanding of the findings of today's phone visit and agrees with plan of treatment. I have discussed any further diagnostic evaluation that may be needed or ordered today. We also reviewed his medications today. he has been encouraged to call the office with any questions or concerns that should arise related to todays visit.    No orders of the defined types were placed in this encounter.   Meds ordered this encounter  Medications  . sulfamethoxazole-trimethoprim (BACTRIM) 400-80 MG tablet    Sig: Take 1 tablet by mouth 2 (two) times daily.    Dispense:  20 tablet    Refill:  0  . fluticasone (FLONASE) 50 MCG/ACT nasal spray    Sig: Place 2 sprays into both nostrils 2 (two) times daily.    Dispense:  16 g    Refill:  2    Time spent: [place] AGNP-C Internal medicine

## 2019-12-30 DIAGNOSIS — F410 Panic disorder [episodic paroxysmal anxiety] without agoraphobia: Secondary | ICD-10-CM | POA: Diagnosis not present

## 2019-12-30 DIAGNOSIS — F332 Major depressive disorder, recurrent severe without psychotic features: Secondary | ICD-10-CM | POA: Diagnosis not present

## 2020-01-12 ENCOUNTER — Other Ambulatory Visit: Payer: Self-pay | Admitting: Adult Health

## 2020-01-12 DIAGNOSIS — R0981 Nasal congestion: Secondary | ICD-10-CM

## 2020-01-15 ENCOUNTER — Ambulatory Visit: Payer: Medicare Other | Attending: Internal Medicine

## 2020-01-15 DIAGNOSIS — Z23 Encounter for immunization: Secondary | ICD-10-CM | POA: Insufficient documentation

## 2020-01-15 NOTE — Progress Notes (Signed)
   Covid-19 Vaccination Clinic  Name:  Jeffery Shaw    MRN: [id number] DOB: 1946-11-13  01/15/2020  [name] was observed post Covid-19 immunization for 15 minutes without incidence. He was provided with Vaccine Information Sheet and instruction to access the V-Safe system.   [name] was instructed to call 911 with any severe reactions post vaccine: [name] Difficulty breathing  . Swelling of your face and throat  . A fast heartbeat  . A bad rash all over your body  . Dizziness and weakness    Immunizations Administered    Name Date Dose VIS Date Route   Pfizer COVID-19 Vaccine 01/15/2020 10:09 AM 0.3 mL 10/30/2019 Intramuscular   Manufacturer: [employer], [employer]   Lot: [id number]   NDC: [address]-3

## 2020-02-03 ENCOUNTER — Telehealth: Payer: Self-pay

## 2020-02-03 NOTE — Telephone Encounter (Signed)
Spoke with [facility] billing about pt bills and gave them more ICD 10 code and they will resubmit to insurance and its take 30-45 days and then send adjustment and send to pt

## 2020-02-09 ENCOUNTER — Ambulatory Visit: Payer: Medicare Other

## 2020-02-14 ENCOUNTER — Ambulatory Visit: Payer: Medicare Other | Attending: Internal Medicine

## 2020-02-14 ENCOUNTER — Ambulatory Visit: Payer: Medicare Other

## 2020-02-14 DIAGNOSIS — Z23 Encounter for immunization: Secondary | ICD-10-CM

## 2020-02-14 NOTE — Progress Notes (Signed)
   Covid-19 Vaccination Clinic  Name:  Jeffery Shaw    MRN: [id number] DOB: 1946/02/18  02/14/2020  [name] was observed post Covid-19 immunization for 15 minutes without incident. He was provided with Vaccine Information Sheet and instruction to access the V-Safe system.   [name] was instructed to call 911 with any severe reactions post vaccine: [name] Difficulty breathing  . Swelling of face and throat  . A fast heartbeat  . A bad rash all over body  . Dizziness and weakness   Immunizations Administered    Name Date Dose VIS Date Route   Pfizer COVID-19 Vaccine 02/14/2020  1:08 PM 0.3 mL 10/30/2019 Intramuscular   Manufacturer: [employer], [employer]   Lot: [id number]   NDC: [address]-6

## 2020-02-16 ENCOUNTER — Ambulatory Visit: Payer: Medicare Other

## 2020-03-10 ENCOUNTER — Other Ambulatory Visit: Payer: Self-pay

## 2020-03-10 ENCOUNTER — Encounter: Payer: Self-pay | Admitting: Adult Health

## 2020-03-10 ENCOUNTER — Ambulatory Visit (INDEPENDENT_AMBULATORY_CARE_PROVIDER_SITE_OTHER): Payer: Medicare Other | Admitting: Adult Health

## 2020-03-10 VITALS — Temp 97.1°F | Ht 72.0 in | Wt 160.0 lb

## 2020-03-10 DIAGNOSIS — R0981 Nasal congestion: Secondary | ICD-10-CM

## 2020-03-10 DIAGNOSIS — J0100 Acute maxillary sinusitis, unspecified: Secondary | ICD-10-CM | POA: Diagnosis not present

## 2020-03-10 MED ORDER — AMOXICILLIN 875 MG PO TABS: 875.0000 mg | ORAL_TABLET | Freq: Two times a day (BID) | ORAL | 0 refills | Status: DC

## 2020-03-10 NOTE — Progress Notes (Signed)
[facility] [place], [place] [address]  Internal MEDICINE  Telephone Visit  Patient Name: Jeffery Shaw  [id number]  [id number]  Date of Service: 03/10/2020  I connected with the patient at 405 by telephone and verified the patients identity using two identifiers.   I discussed the limitations, risks, security and privacy concerns of performing an evaluation and management service by telephone and the availability of in person appointments. I also discussed with the patient that there may be a patient responsible charge related to the service.  The patient expressed understanding and agrees to proceed.    Chief Complaint  Patient presents with  . Telephone Screen  . Telephone Assessment  . Sinusitis  . Headache    HPI  Pt is seen via telephone.  Patient reports a few days of sinus pressure, headache, congestion and PND.  He has been taking Zyrtec D daily, and dimetapp for symptoms. He denies any fever or other symptoms. He has been vaccinated for Covid-19.    Current Medication: Outpatient Encounter Medications as of 03/10/2020  Medication Sig  . apixaban (ELIQUIS) 5 MG TABS tablet Take 1 tablet (5 mg total) by mouth 2 (two) times daily. Need appt w/ Dr. Fletcher Anon for further refills.  [name] buPROPion (WELLBUTRIN SR) 100 MG 12 hr tablet Take 100 mg by mouth daily.  . clonazePAM (KLONOPIN) 0.5 MG tablet Take 2.5 mg by mouth 2 (two) times daily as needed for anxiety.  . digoxin (LANOXIN) 0.125 MG tablet Take 1 tablet (125 mcg total) by mouth daily.  . fluticasone (FLONASE) 50 MCG/ACT nasal spray SHAKE LIQUID AND USE 2 SPRAYS IN EACH NOSTRIL TWICE DAILY  . furosemide (LASIX) 20 MG tablet TAKE 1 TABLET BY MOUTH EVERY DAY AS NEEDED  . levothyroxine (SYNTHROID) 75 MCG tablet Take 1 tablet (75 mcg total) by mouth daily.  [name] losartan (COZAAR) 25 MG tablet Take 1 tablet (25 mg total) by mouth daily. *NEEDS OFFICE VISIT FOR FURTHER REFILLS-PLEASE CALL [phone] TO  SCHEDULE.*  . metoprolol tartrate (LOPRESSOR) 100 MG tablet Take 1 tablet (100 mg total) by mouth 2 (two) times daily.  . Plant Sterols and Stanols (CHOLEST OFF PO) Take 1 tablet by mouth 2 (two) times daily.   . pneumococcal 23 valent vaccine (PNEUMOVAX 23) 25 MCG/0.5ML injection Inject 0.68ml IM once  . rOPINIRole (REQUIP) 0.5 MG tablet Take 1 tablet (0.5 mg total) by mouth as needed.  . sertraline (ZOLOFT) 100 MG tablet Take 50 mg by mouth daily.   . simvastatin (ZOCOR) 20 MG tablet Take 0.5 tablets (10 mg total) by mouth daily. (Patient taking differently: Take 10 mg by mouth. Every third day)  . sulfamethoxazole-trimethoprim (BACTRIM) 400-80 MG tablet Take 1 tablet by mouth 2 (two) times daily.  [name] VITAMIN D, ERGOCALCIFEROL, PO Take 400 Units by mouth daily.  [name] amoxicillin (AMOXIL) 875 MG tablet Take 1 tablet (875 mg total) by mouth 2 (two) times daily.   No facility-administered encounter medications on file as of 03/10/2020.    Surgical History: Past Surgical History:  Procedure Laterality Date  . CARDIAC CATHETERIZATION  07/2012   [facility]  . CARDIOVERSION  09/15/12  . CATARACT EXTRACTION W/PHACO Left 04/17/2016   Procedure: CATARACT EXTRACTION PHACO AND INTRAOCULAR LENS PLACEMENT (IOC);  Surgeon: Birder Robson, MD;  Location: [facility] ORS;  Service: Ophthalmology;  Laterality: Left;  [place] 32.9 AP% 20.1 CDE 6.59 FLUID PACK LOT # [id number]  . CATARACT EXTRACTION W/PHACO Right 05/21/2016   Procedure: CATARACT EXTRACTION PHACO  AND INTRAOCULAR LENS PLACEMENT (IOC);  Surgeon: Galen Manila, MD;  Location: [facility] ORS;  Service: Ophthalmology;  Laterality: Right;  [place] 31.0 AP% 20.4 CDE 6.30 Fluid Pck Lot # [id number] H  . CYSTOSCOPY W/ URETEROSCOPY    . TEE WITHOUT CARDIOVERSION      Medical History: Past Medical History:  Diagnosis Date  . Asthma   . Chronic atrial fibrillation (HCC)    a. Rate-controlled; b. CHA2DS2VASc = 2--> Eliquis.  . Chronic systolic heart failure (HCC)    a. 07/2012  TEE: EF 30-35%; b. 07/2012 Cath: no significant coronary artery disease with an ejection fraction of 35%. Normal filling pressures with only mild PAH; c. 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 Nl PASP.  [name] Emphysema, unspecified (HCC)   . GERD (gastroesophageal reflux disease)   . Hyperlipidemia   . Hypothyroidism   . Mildly dilated aortic root (HCC)    a. 12/2013 Echo: Ao root 42mm.  [name] NICM (nonischemic cardiomyopathy) (HCC)    a. 07/2012 TEE: EF 30-35%; b. 07/2012 Cath: nonobs dzs, EF 35%; c. 12/2013 Echo: EF 40-45%.  . Palpitations   . Pancreatitis 1980   due to ETOH  . Panic attacks   . PFO (patent foramen ovale)    a. 2013 TEE & Cath: Tunnelled PFO with a small left-to-right atrial shunt noted on TEE with a QP/QS ratio of 1.37 by cardiac cath; b. 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  . Sleep apnea    CPAP    Family History: Family History  Problem Relation Age of Onset  . Arrhythmia Sister        A-fib/stroke   . Hyperlipidemia Brother     Social History   Socioeconomic History  . Marital status: Married    Spouse name: Not on file  . Number of children: Not on file  . Years of education: Not on file  . Highest education level: Not on file  Occupational History  . Not on file  Tobacco Use  . Smoking status: Former Smoker    Packs/day: 0.50    Years: 20.00    Pack years: 10.00    Types: Cigarettes    Quit date: 08/07/2008    Years since quitting: 11.5  . Smokeless tobacco: Never Used  Substance and Sexual Activity  . Alcohol use: No  . Drug use: No  . Sexual activity: Not on file  Other Topics Concern  . Not on file  Social History Narrative  . Not on file   Social Determinants of Health   Financial Resource Strain:   . Difficulty of Paying Living Expenses:   Food Insecurity:   . Worried About Programme researcher, broadcasting/film/video in the Last Year:   . Barista in the Last Year:    Transportation Needs:   . Freight forwarder (Medical):   [name] Lack of Transportation (Non-Medical):   Physical Activity:   . Days of Exercise per Week:   . Minutes of Exercise per Session:   Stress:   . Feeling of Stress :   Social Connections:   . Frequency of Communication with Friends and Family:   . Frequency of Social Gatherings with Friends and Family:   . Attends Religious Services:   . Active Member of Clubs or Organizations:   . Attends Banker Meetings:   [name] Marital Status:   Intimate Partner Violence:   .  Fear of Current or Ex-Partner:   . Emotionally Abused:   [name] Physically Abused:   . Sexually Abused:       Review of Systems  Vital Signs: Temp (!) 97.1 F (36.2 C)   Ht 6' (1.829 m)   Wt 160 lb (72.6 kg)   BMI 21.70 kg/m    Observation/Objective:  Well sounding, NAD noted   Assessment/Plan: 1. Acute maxillary sinusitis, recurrence not specified Advised patient to take entire course of antibiotics as prescribed with food. Pt should return to clinic in 7-10 days if symptoms fail to improve or new symptoms develop.  - amoxicillin (AMOXIL) 875 MG tablet; Take 1 tablet (875 mg total) by mouth 2 (two) times daily.  Dispense: 20 tablet; Refill: 0  2. Sinus congestion Use flonase 2 sprays in each nostril twice daily.  Take dialy allergy medication, and use dayquil/nyquil for symptom management.    General Counseling: zuri bradway understanding of the findings of today's phone visit and agrees with plan of treatment. I have discussed any further diagnostic evaluation that may be needed or ordered today. We also reviewed his medications today. he has been encouraged to call the office with any questions or concerns that should arise related to todays visit.    No orders of the defined types were placed in this encounter.   Meds ordered this encounter  Medications  . amoxicillin (AMOXIL) 875 MG tablet    Sig: Take 1 tablet (875 mg total)  by mouth 2 (two) times daily.    Dispense:  20 tablet    Refill:  0    Time spent: 25 Minutes    Blima Ledger AGNP-C Internal medicine

## 2020-03-13 NOTE — Progress Notes (Deleted)
Cardiology Office Note    Date:  03/13/2020   ID:  Jeffery, Shaw 10-16-1946, MRN [id number]  PCP:  Lyndon Code, MD  Cardiologist:  Lorine Bears, MD  Electrophysiologist:  None   Chief Complaint: Follow up  History of Present Illness:   Jeffery Shaw is a 74 y.o. 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   TEE in 2013, showed an EF of 30-35% with normal atrial size along with a tunneled PFO with small to moderate left atrial shunting by color Doppler. A R/LHC in 07/2012 showed no significant CAD with a QP/QS shunt ratio of 1.37. His most recent echo from 12/2013 showed an EF of 40-45% as detailed below. He has been maintained with rate control strategy given prior unsuccessful  DCCV, even on amiodarone. He was last seen virtually in 02/2019, and was doing well with an asymptomatic bradycardic ventricular rate.   ***   Labs independently reviewed: 11/2019 - TSH normal, A1c 5.5, TC 178, TG 77, HDL 56, LDL 108, HGB 16.1, PLT 312, BUN 21, SCr 1.05, potassium 4.6, albumin 4.2, AST/ALT normal  Past Medical History:  Diagnosis Date  . Asthma   . Chronic atrial fibrillation (HCC)    a. Rate-controlled; b. CHA2DS2VASc = 2--> Eliquis.  . Chronic systolic heart failure (HCC)    a. 07/2012 TEE: EF 30-35%; b. 07/2012 Cath: no significant coronary artery disease with an ejection fraction of 35%. Normal filling pressures with only mild PAH; c. 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 Nl PASP.  [name] Emphysema, unspecified (HCC)   . GERD (gastroesophageal reflux disease)   . Hyperlipidemia   . Hypothyroidism   . Mildly dilated aortic root (HCC)    a. 12/2013 Echo: Ao root 36mm.  [name] NICM (nonischemic cardiomyopathy) (HCC)    a. 07/2012 TEE: EF 30-35%; b.  07/2012 Cath: nonobs dzs, EF 35%; c. 12/2013 Echo: EF 40-45%.  . Palpitations   . Pancreatitis 1980   due to ETOH  . Panic attacks   . PFO (patent foramen ovale)    a. 2013 TEE & Cath: Tunnelled PFO with a small left-to-right atrial shunt noted on TEE with a QP/QS ratio of 1.37 by cardiac cath; b. 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  . Sleep apnea    CPAP    Past Surgical History:  Procedure Laterality Date  . CARDIAC CATHETERIZATION  07/2012   [facility]  . CARDIOVERSION  09/15/12  . CATARACT EXTRACTION W/PHACO Left 04/17/2016   Procedure: CATARACT EXTRACTION PHACO AND INTRAOCULAR LENS PLACEMENT (IOC);  Surgeon: Galen Manila, MD;  Location: [facility] ORS;  Service: Ophthalmology;  Laterality: Left;  [place] 32.9 AP% 20.1 CDE 6.59 FLUID PACK LOT # [id number]  . CATARACT EXTRACTION W/PHACO Right 05/21/2016   Procedure: CATARACT EXTRACTION PHACO AND INTRAOCULAR LENS PLACEMENT (IOC);  Surgeon: Galen Manila, MD;  Location: [facility] ORS;  Service: Ophthalmology;  Laterality: Right;  [place] 31.0 AP% 20.4 CDE 6.30 Fluid Pck Lot # [id number]  . CYSTOSCOPY W/ URETEROSCOPY    . TEE WITHOUT CARDIOVERSION      Current Medications: No outpatient medications have been marked as taking for the 03/14/20 encounter (Appointment) with Sondra Barges, PA-C.    Allergies:   Patient has  no known allergies.   Social History   Socioeconomic History  . Marital status: Married    Spouse name: Not on file  . Number of children: Not on file  . Years of education: Not on file  . Highest education level: Not on file  Occupational History  . Not on file  Tobacco Use  . Smoking status: Former Smoker    Packs/day: 0.50    Years: 20.00    Pack years: 10.00    Types: Cigarettes    Quit date: 08/07/2008    Years since quitting: 11.6  . Smokeless tobacco: Never Used  Substance and Sexual Activity  . Alcohol use: No  . Drug use: No  . Sexual activity: Not on file  Other Topics Concern  . Not on file    Social History Narrative  . Not on file   Social Determinants of Health   Financial Resource Strain:   . Difficulty of Paying Living Expenses:   Food Insecurity:   . Worried About Programme researcher, broadcasting/film/video in the Last Year:   . Barista in the Last Year:   Transportation Needs:   . Freight forwarder (Medical):   [name] Lack of Transportation (Non-Medical):   Physical Activity:   . Days of Exercise per Week:   . Minutes of Exercise per Session:   Stress:   . Feeling of Stress :   Social Connections:   . Frequency of Communication with Friends and Family:   . Frequency of Social Gatherings with Friends and Family:   . Attends Religious Services:   . Active Member of Clubs or Organizations:   . Attends Banker Meetings:   [name] Marital Status:      Family History:  The patient's family history includes Arrhythmia in his sister; Hyperlipidemia in his brother.  ROS:   ROS   EKGs/Labs/Other Studies Reviewed:    Studies reviewed were summarized above. The additional studies were reviewed today:  2D echo 12/2013: - Left ventricle: The cavity size was normal. Wall thickness  was normal. Systolic function was mildly to moderately  reduced. The estimated ejection fraction was in the range  of 40% to 45%. Wall motion was normal; there were no  regional wall motion abnormalities. The study is not  technically sufficient to allow evaluation of LV diastolic  function.  - Aorta: Aortic root dimension: 42mm (ED).  - Aortic root: The aortic root was mildly dilated.  - Mitral valve: Mild regurgitation.  - Left atrium: The atrium was mildly dilated.  - Right ventricle: The cavity size was mildly dilated. Wall  thickness was normal.  - Right atrium: The atrium was mildly dilated.  - Atrial septum: There was a small secundum atrial septal  defect. There was a small left-to-right shunt through an  atrial septal defect.  - Pulmonary arteries: Systolic  pressure was within the  normal range.    EKG:  EKG is ordered today.  The EKG ordered today demonstrates ***  Recent Labs: 12/11/2019: ALT 25; BUN 21; Creatinine, Ser 1.05; Hemoglobin 16.1; Platelets 312; Potassium 4.6; Sodium 140; TSH 2.480  Recent Lipid Panel    Component Value Date/Time   CHOL 178 12/11/2019 0924   TRIG 77 12/11/2019 0924   HDL 56 12/11/2019 0924   CHOLHDL 3.2 12/12/2017 0926   LDLCALC 108 (H) 12/11/2019 0924    PHYSICAL EXAM:    VS:  There were no vitals taken for this visit.  BMI: There is  no height or weight on file to calculate BMI.  Physical Exam  Wt Readings from Last 3 Encounters:  03/10/20 160 lb (72.6 kg)  07/14/19 162 lb 12.8 oz (73.8 kg)  03/17/19 168 lb (76.2 kg)     ASSESSMENT & PLAN:   1. ***  Disposition: F/u with Dr. Fletcher Anon or an APP in ***.   Medication Adjustments/Labs and Tests Ordered: Current medicines are reviewed at length with the patient today.  Concerns regarding medicines are outlined above. Medication changes, Labs and Tests ordered today are summarized above and listed in the Patient Instructions accessible in Encounters.   Signed, Christell Faith, PA-C 03/13/2020 9:31 AM     [facility] [address] [place], [place] [address] [phone]

## 2020-03-14 ENCOUNTER — Ambulatory Visit: Payer: Medicare Other | Admitting: Physician Assistant

## 2020-03-15 ENCOUNTER — Encounter: Payer: Self-pay | Admitting: Physician Assistant

## 2020-03-18 ENCOUNTER — Other Ambulatory Visit: Payer: Self-pay | Admitting: Cardiovascular Disease

## 2020-03-25 ENCOUNTER — Other Ambulatory Visit: Payer: Self-pay

## 2020-03-25 DIAGNOSIS — G2581 Restless legs syndrome: Secondary | ICD-10-CM

## 2020-03-25 DIAGNOSIS — E039 Hypothyroidism, unspecified: Secondary | ICD-10-CM

## 2020-03-25 MED ORDER — LEVOTHYROXINE SODIUM 75 MCG PO TABS
75.0000 ug | ORAL_TABLET | Freq: Every day | ORAL | 3 refills | Status: DC
Start: 2020-03-25 — End: 2021-01-24

## 2020-03-25 MED ORDER — ROPINIROLE HCL 0.5 MG PO TABS: 0.5000 mg | ORAL_TABLET | ORAL | 3 refills | Status: DC | PRN

## 2020-03-31 ENCOUNTER — Other Ambulatory Visit: Payer: Self-pay | Admitting: Cardiovascular Disease

## 2020-04-06 ENCOUNTER — Ambulatory Visit: Payer: Medicare Other | Admitting: Physician Assistant

## 2020-04-10 NOTE — Progress Notes (Signed)
Cardiology Office Note    Date:  04/13/2020   ID:  Jeffery, Shaw Jan 12, 1946, MRN [id number]  PCP:  Lyndon Code, MD  Cardiologist:  Lorine Bears, MD  Electrophysiologist:  None   Chief Complaint: Follow up  History of Present Illness:   Jeffery Shaw is a 74 y.o. 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   TEE in 2013, showed an EF of 30-35% with normal atrial size along with a tunneled PFO with small to moderate left atrial shunting by color Doppler. A R/LHC in 07/2012 showed no significant CAD with a QP/QS shunt ratio of 1.37. His most recent echo from 12/2013 showed an EF of 40-45% as detailed below. He has been maintained with rate control strategy given prior unsuccessful  DCCV, even on amiodarone. He was last seen virtually in 02/2019, and was doing well with an asymptomatic bradycardic ventricular rate.   He comes in doing well from a cardiac perspective.  He denies any chest pain, dyspnea, palpitations, dizziness, presyncope, syncope, lower extremity swelling, abdominal distention, orthopnea, PND, or early satiety.  He does note stable fatigue which has been present for the past several years.  He did have 1 mechanical fall since he was last seen in which he slipped on a wet metal step.  He did not hit his head or suffer loss of consciousness.  No hematochezia or melena.  He does not check his blood pressure at home.  He is tolerating all medications including anticoagulation without issue.     Labs independently reviewed: 11/2019 - TSH normal, A1c 5.5, TC 178, TG 77, HDL 56, LDL 108, HGB 16.1, PLT 312, BUN 21, SCr 1.05, potassium 4.6, albumin 4.2, AST/ALT normal   Past Medical History:  Diagnosis Date  . Asthma   . Chronic atrial fibrillation (HCC)    a. Rate-controlled; b. CHA2DS2VASc = 2--> Eliquis.    . Chronic systolic heart failure (HCC)    a. 07/2012 TEE: EF 30-35%; b. 07/2012 Cath: no significant coronary artery disease with an ejection fraction of 35%. Normal filling pressures with only mild PAH; c. 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 Nl PASP.  [name] Emphysema, unspecified (HCC)   . GERD (gastroesophageal reflux disease)   . Hyperlipidemia   . Hypothyroidism   . Mildly dilated aortic root (HCC)    a. 12/2013 Echo: Ao root 65mm.  [name] NICM (nonischemic cardiomyopathy) (HCC)    a. 07/2012 TEE: EF 30-35%; b. 07/2012 Cath: nonobs dzs, EF 35%; c. 12/2013 Echo: EF 40-45%.  . Palpitations   . Pancreatitis 1980   due to ETOH  . Panic attacks   . PFO (patent foramen ovale)    a. 2013 TEE & Cath: Tunnelled PFO with a small left-to-right atrial shunt noted on TEE with a QP/QS ratio of 1.37 by cardiac cath; b. 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  . Sleep apnea    CPAP    Past Surgical History:  Procedure Laterality Date  . CARDIAC CATHETERIZATION  07/2012   [facility]  . CARDIOVERSION  09/15/12  . CATARACT EXTRACTION W/PHACO Left 04/17/2016   Procedure: CATARACT EXTRACTION PHACO AND INTRAOCULAR LENS PLACEMENT (IOC);  Surgeon: Galen Manila, MD;  Location: [facility] ORS;  Service:  Ophthalmology;  Laterality: Left;  [place] 32.9 AP% 20.1 CDE 6.59 FLUID PACK LOT # [id number]  . CATARACT EXTRACTION W/PHACO Right 05/21/2016   Procedure: CATARACT EXTRACTION PHACO AND INTRAOCULAR LENS PLACEMENT (IOC);  Surgeon: Galen Manila, MD;  Location: [facility] ORS;  Service: Ophthalmology;  Laterality: Right;  [place] 31.0 AP% 20.4 CDE 6.30 Fluid Pck Lot # [id number] H  . CYSTOSCOPY W/ URETEROSCOPY    . TEE WITHOUT CARDIOVERSION      Current Medications: Current Meds  Medication Sig  . amoxicillin (AMOXIL) 875 MG tablet Take 1 tablet (875 mg total) by mouth 2 (two) times daily.  [name] apixaban (ELIQUIS) 5 MG TABS tablet Take 1 tablet (5 mg total)  by mouth 2 (two) times daily. Need appt w/ Dr. Kirke Corin for further refills.  [name] buPROPion (WELLBUTRIN SR) 100 MG 12 hr tablet Take 100 mg by mouth daily.  . clonazePAM (KLONOPIN) 0.5 MG tablet Take 2.5 mg by mouth 2 (two) times daily as needed for anxiety.  . digoxin (LANOXIN) 0.125 MG tablet TAKE 1 TABLET(125 MCG) BY MOUTH DAILY  . fluticasone (FLONASE) 50 MCG/ACT nasal spray SHAKE LIQUID AND USE 2 SPRAYS IN EACH NOSTRIL TWICE DAILY  . furosemide (LASIX) 20 MG tablet TAKE 1 TABLET BY MOUTH EVERY DAY AS NEEDED  . levothyroxine (SYNTHROID) 75 MCG tablet Take 1 tablet (75 mcg total) by mouth daily.  [name] losartan (COZAAR) 25 MG tablet TAKE 1 TABLET(25 MG) BY MOUTH DAILY  . metoprolol tartrate (LOPRESSOR) 100 MG tablet Take 1 tablet (100 mg total) by mouth 2 (two) times daily.  . Plant Sterols and Stanols (CHOLEST OFF PO) Take 1 tablet by mouth 2 (two) times daily.   . pneumococcal 23 valent vaccine (PNEUMOVAX 23) 25 MCG/0.5ML injection Inject 0.54ml IM once  . rOPINIRole (REQUIP) 0.5 MG tablet Take 1 tablet (0.5 mg total) by mouth as needed.  . sertraline (ZOLOFT) 100 MG tablet Take 50 mg by mouth daily.   . simvastatin (ZOCOR) 20 MG tablet Take 0.5 tablets (10 mg total) by mouth daily. (Patient taking differently: Take 10 mg by mouth. Every third day)  . sulfamethoxazole-trimethoprim (BACTRIM) 400-80 MG tablet Take 1 tablet by mouth 2 (two) times daily.  [name] VITAMIN D, ERGOCALCIFEROL, PO Take 400 Units by mouth daily.    Allergies:   Patient has no known allergies.   Social History   Socioeconomic History  . Marital status: Married    Spouse name: Not on file  . Number of children: Not on file  . Years of education: Not on file  . Highest education level: Not on file  Occupational History  . Not on file  Tobacco Use  . Smoking status: Former Smoker    Packs/day: 0.50    Years: 20.00    Pack years: 10.00    Types: Cigarettes    Quit date: 08/07/2008    Years since quitting: 11.6  .  Smokeless tobacco: Never Used  Substance and Sexual Activity  . Alcohol use: No  . Drug use: No  . Sexual activity: Not on file  Other Topics Concern  . Not on file  Social History Narrative  . Not on file   Social Determinants of Health   Financial Resource Strain:   . Difficulty of Paying Living Expenses:   Food Insecurity:   . Worried About Programme researcher, broadcasting/film/video in the Last Year:   . Barista in the Last Year:   Transportation Needs:   . Freight forwarder (Medical):   [name]  Lack of Transportation (Non-Medical):   Physical Activity:   . Days of Exercise per Week:   . Minutes of Exercise per Session:   Stress:   . Feeling of Stress :   Social Connections:   . Frequency of Communication with Friends and Family:   . Frequency of Social Gatherings with Friends and Family:   . Attends Religious Services:   . Active Member of Clubs or Organizations:   . Attends Archivist Meetings:   [name] Marital Status:      Family History:  The patient's family history includes Arrhythmia in his sister; Hyperlipidemia in his brother.  ROS:   Review of Systems  Constitutional: Positive for malaise/fatigue. Negative for chills, diaphoresis, fever and weight loss.  HENT: Negative for congestion.   Eyes: Negative for discharge and redness.  Respiratory: Negative for cough, sputum production, shortness of breath and wheezing.   Cardiovascular: Negative for chest pain, palpitations, orthopnea, claudication, leg swelling and PND.  Gastrointestinal: Negative for abdominal pain, blood in stool, heartburn, melena, nausea and vomiting.  Musculoskeletal: Negative for falls and myalgias.  Skin: Negative for rash.  Neurological: Negative for dizziness, tingling, tremors, sensory change, speech change, focal weakness, loss of consciousness and weakness.  Endo/Heme/Allergies: Does not bruise/bleed easily.  Psychiatric/Behavioral: Negative for substance abuse. The patient is not  nervous/anxious.   All other systems reviewed and are negative.    EKGs/Labs/Other Studies Reviewed:    Studies reviewed were summarized above. The additional studies were reviewed today:  2D echo 12/2013: - Left ventricle: The cavity size was normal. Wall thickness  was normal. Systolic function was mildly to moderately  reduced. The estimated ejection fraction was in the range  of 40% to 45%. Wall motion was normal; there were no  regional wall motion abnormalities. The study is not  technically sufficient to allow evaluation of LV diastolic  function.  - Aorta: Aortic root dimension: 63mm (ED).  - Aortic root: The aortic root was mildly dilated.  - Mitral valve: Mild regurgitation.  - Left atrium: The atrium was mildly dilated.  - Right ventricle: The cavity size was mildly dilated. Wall  thickness was normal.  - Right atrium: The atrium was mildly dilated.  - Atrial septum: There was a small secundum atrial septal  defect. There was a small left-to-right shunt through an  atrial septal defect.  - Pulmonary arteries: Systolic pressure was within the  normal range.    EKG:  EKG is ordered today.  The EKG ordered today demonstrates A. fib, 74 bpm, nonspecific ST-T change  Recent Labs: 12/11/2019: ALT 25; BUN 21; Creatinine, Ser 1.05; Hemoglobin 16.1; Platelets 312; Potassium 4.6; Sodium 140; TSH 2.480  Recent Lipid Panel    Component Value Date/Time   CHOL 178 12/11/2019 0924   TRIG 77 12/11/2019 0924   HDL 56 12/11/2019 0924   CHOLHDL 3.2 12/12/2017 0926   LDLCALC 108 (H) 12/11/2019 0924    PHYSICAL EXAM:    VS:  BP 90/70 (BP Location: Left Arm, Patient Position: Sitting, Cuff Size: Normal)   Pulse 74   Ht 6\' 1"  (1.854 m)   Wt 172 lb 4 oz (78.1 kg)   SpO2 97%   BMI 22.73 kg/m   BMI: Body mass index is 22.73 kg/m.  Physical Exam  Constitutional: He is oriented to person, place, and time. He appears well-developed and well-nourished.  HENT:    Head: Normocephalic and atraumatic.  Eyes: Right eye exhibits no discharge. Left eye exhibits no discharge.  Neck: No JVD present.  Cardiovascular: Normal rate, S1 normal, S2 normal and normal heart sounds. An irregularly irregular rhythm present. Exam reveals no distant heart sounds, no friction rub, no midsystolic click and no opening snap.  No murmur heard. Pulses:      Posterior tibial pulses are 2+ on the right side and 2+ on the left side.  Pulmonary/Chest: Effort normal and breath sounds normal. No respiratory distress. He has no decreased breath sounds. He has no wheezes. He has no rales. He exhibits no tenderness.  Abdominal: Soft. He exhibits no distension. There is no abdominal tenderness.  Musculoskeletal:        General: No edema.     Cervical back: Normal range of motion.  Neurological: He is alert and oriented to person, place, and time.  Skin: Skin is warm and dry. No cyanosis. Nails show no clubbing.  Psychiatric: He has a normal mood and affect. His speech is normal and behavior is normal. Judgment and thought content normal.    Wt Readings from Last 3 Encounters:  04/13/20 172 lb 4 oz (78.1 kg)  03/10/20 160 lb (72.6 kg)  07/14/19 162 lb 12.8 oz (73.8 kg)     ASSESSMENT & PLAN:   1. Permanent Afib: Ventricular rate is well controlled on metoprolol and low-dose digoxin.  He is tolerating anticoagulation without adverse effects.  CHA2DS2-VASc at least 2 (CHF, age x1).  Check CBC, BMP, and digoxin level.  We did discuss potentially decreasing metoprolol given his underlying fatigue though with this there are some concerns about his ventricular rates becoming mildly tachycardic.  Await digoxin level.  2. HFrEF secondary to NICM: He is euvolemic and well compensated.  BP is on the soft side today.  This is likely contributing to his underlying fatigue.  Decrease losartan to 12.5 mg daily.  He will otherwise continue Lopressor 100 mg twice daily.  Hypotension precludes  further escalation of GDMT.  3. Small PFO: Continue to monitor.  Update echo.  4. HLD: LDL 108 from 11/2019 with normal LFT at that time with goal LDL < 70.  Remains on simvastatin.  5. OSA: This may be contributing to his fatigue.  Consider updating sleep study in the future.  Disposition: F/u with Dr. Kirke Corin or an APP in 6 months.   Medication Adjustments/Labs and Tests Ordered: Current medicines are reviewed at length with the patient today.  Concerns regarding medicines are outlined above. Medication changes, Labs and Tests ordered today are summarized above and listed in the Patient Instructions accessible in Encounters.   Signed, Eula Listen, PA-C 04/13/2020 3:05 PM     [facility] [address] [place], [place] [address] [phone]

## 2020-04-13 ENCOUNTER — Ambulatory Visit (INDEPENDENT_AMBULATORY_CARE_PROVIDER_SITE_OTHER): Payer: Medicare Other | Admitting: Physician Assistant

## 2020-04-13 ENCOUNTER — Other Ambulatory Visit: Payer: Self-pay

## 2020-04-13 ENCOUNTER — Encounter: Payer: Self-pay | Admitting: Physician Assistant

## 2020-04-13 VITALS — BP 90/70 | HR 74 | Ht 73.0 in | Wt 172.2 lb

## 2020-04-13 DIAGNOSIS — I4821 Permanent atrial fibrillation: Secondary | ICD-10-CM

## 2020-04-13 DIAGNOSIS — I5022 Chronic systolic (congestive) heart failure: Secondary | ICD-10-CM | POA: Diagnosis not present

## 2020-04-13 DIAGNOSIS — I482 Chronic atrial fibrillation, unspecified: Secondary | ICD-10-CM

## 2020-04-13 DIAGNOSIS — I428 Other cardiomyopathies: Secondary | ICD-10-CM

## 2020-04-13 DIAGNOSIS — Q211 Atrial septal defect: Secondary | ICD-10-CM | POA: Diagnosis not present

## 2020-04-13 DIAGNOSIS — Z9989 Dependence on other enabling machines and devices: Secondary | ICD-10-CM

## 2020-04-13 DIAGNOSIS — G4733 Obstructive sleep apnea (adult) (pediatric): Secondary | ICD-10-CM

## 2020-04-13 DIAGNOSIS — Q2112 Patent foramen ovale: Secondary | ICD-10-CM

## 2020-04-13 DIAGNOSIS — E782 Mixed hyperlipidemia: Secondary | ICD-10-CM

## 2020-04-13 MED ORDER — LOSARTAN POTASSIUM 25 MG PO TABS: 12.5000 mg | ORAL_TABLET | Freq: Every day | ORAL | 3 refills | Status: DC

## 2020-04-13 NOTE — Patient Instructions (Signed)
Medication Instructions:  1- DECREASE Losartan  *If you need a refill on your cardiac medications before your next appointment, please call your pharmacy*   Lab Work: 1- Your physician recommends that you have lab work today(BMET, CBC, Dig level  If you have labs (blood work) drawn today and your tests are completely normal, you will receive your results only by: [name] MyChart Message (if you have MyChart) OR . A paper copy in the mail If you have any lab test that is abnormal or we need to change your treatment, we will call you to review the results.   Testing/Procedures: None ordered    Follow-Up: At [facility], you and your health needs are our priority.  As part of our continuing mission to provide you with exceptional heart care, we have created designated Provider Care Teams.  These Care Teams include your primary Cardiologist (physician) and Advanced Practice Providers (APPs -  Physician Assistants and Nurse Practitioners) who all work together to provide you with the care you need, when you need it.  We recommend signing up for the patient portal called "MyChart".  Sign up information is provided on this After Visit Summary.  MyChart is used to connect with patients for Virtual Visits (Telemedicine).  Patients are able to view lab/test results, encounter notes, upcoming appointments, etc.  Non-urgent messages can be sent to your provider as well.   To learn more about what you can do with MyChart, go to [email or web address].    Your next appointment:   6 month(s)  The format for your next appointment:   In Person  Provider:    You may see Lorine Bears, MD or Eula Listen, PA-C

## 2020-04-14 ENCOUNTER — Telehealth: Payer: Self-pay

## 2020-04-14 DIAGNOSIS — Q2112 Patent foramen ovale: Secondary | ICD-10-CM

## 2020-04-14 LAB — CBC
Hematocrit: 47 % (ref 37.5–51.0)
Hemoglobin: 15.8 g/dL (ref 13.0–17.7)
MCH: 32.4 pg (ref 26.6–33.0)
MCHC: 33.6 g/dL (ref 31.5–35.7)
MCV: 97 fL (ref 79–97)
Platelets: 275 10*3/uL (ref 150–450)
RBC: 4.87 x10E6/uL (ref 4.14–5.80)
RDW: 12.6 % (ref 11.6–15.4)
WBC: 8.6 10*3/uL (ref 3.4–10.8)

## 2020-04-14 LAB — BASIC METABOLIC PANEL
BUN/Creatinine Ratio: 19 (ref 10–24)
BUN: 23 mg/dL (ref 8–27)
CO2: 24 mmol/L (ref 20–29)
Calcium: 9.1 mg/dL (ref 8.6–10.2)
Chloride: 103 mmol/L (ref 96–106)
Creatinine, Ser: 1.21 mg/dL (ref 0.76–1.27)
GFR calc Af Amer: 69 mL/min/{1.73_m2} (ref >59)
GFR calc non Af Amer: 59 mL/min/{1.73_m2} — ABNORMAL LOW (ref >59)
Glucose: 85 mg/dL (ref 65–99)
Potassium: 4.9 mmol/L (ref 3.5–5.2)
Sodium: 140 mmol/L (ref 134–144)

## 2020-04-14 LAB — DIGOXIN LEVEL: Digoxin, Serum: 0.6 ng/mL (ref 0.5–0.9)

## 2020-04-14 NOTE — Telephone Encounter (Signed)
Attempted to schedule no ans no vm  

## 2020-04-14 NOTE — Telephone Encounter (Signed)
-----   Message from Sondra Barges, PA-C sent at 04/13/2020  5:15 PM EDT ----- Can you please get him scheduled for an echo.  Diagnosis PFO.  Thanks!

## 2020-04-17 ENCOUNTER — Other Ambulatory Visit: Payer: Self-pay | Admitting: Cardiovascular Disease

## 2020-04-21 NOTE — Telephone Encounter (Signed)
Attempted to schedule. No ans no vm on cell.  Patient not at work at this time.

## 2020-05-05 ENCOUNTER — Encounter: Payer: Self-pay | Admitting: Cardiovascular Disease

## 2020-05-05 NOTE — Telephone Encounter (Signed)
Call to patient to review POC.   Pt verbalized understanding of routine echo.   No further questions at this time.   Echo scheduled 7/21.

## 2020-05-05 NOTE — Telephone Encounter (Signed)
This encounter was created in error - please disregard.

## 2020-05-05 NOTE — Telephone Encounter (Signed)
Patient would like a call to discuss the reason he needs an echo.

## 2020-05-05 NOTE — Telephone Encounter (Signed)
See previous phone note 04/14/20 - checking on clarification of ECHO

## 2020-05-24 ENCOUNTER — Telehealth: Payer: Self-pay

## 2020-05-24 ENCOUNTER — Other Ambulatory Visit: Payer: Self-pay | Admitting: Cardiovascular Disease

## 2020-05-24 NOTE — Telephone Encounter (Signed)
No vm set  Up unable to confirm and screen for 05-26-20 ov.

## 2020-05-26 ENCOUNTER — Ambulatory Visit: Payer: Medicare Other | Admitting: Nurse Practitioner

## 2020-05-31 ENCOUNTER — Telehealth: Payer: Self-pay

## 2020-05-31 NOTE — Telephone Encounter (Signed)
BILLED MISSED APPOINTMENT FEE FOR 05/26/20 °

## 2020-06-07 DIAGNOSIS — F410 Panic disorder [episodic paroxysmal anxiety] without agoraphobia: Secondary | ICD-10-CM | POA: Diagnosis not present

## 2020-06-09 ENCOUNTER — Other Ambulatory Visit: Payer: Medicare Other

## 2020-06-20 ENCOUNTER — Other Ambulatory Visit: Payer: Self-pay | Admitting: Cardiovascular Disease

## 2020-06-20 MED ORDER — LOSARTAN POTASSIUM 25 MG PO TABS: 12.5000 mg | ORAL_TABLET | Freq: Every day | ORAL | 0 refills | Status: DC

## 2020-06-20 NOTE — Telephone Encounter (Signed)
*  STAT* If patient is at the pharmacy, call can be transferred to refill team.   1. Which medications need to be refilled? (please list name of each medication and dose if known)   Losartan 25 mg po q d    2. Which pharmacy/location (including street and city if local pharmacy) is medication to be sent to?  [facility] [address] [place]   3. Do they need a 30 day or 90 day supply? 90    Patient out of meds.  Previous ov dose changed to 12.5 mg but patient has started taking whole 25 mg pill again.  He states he discussed this with Alycia Rossetti that it may have been klonopin and after taking bp later that same day bp was back to normal

## 2020-06-20 NOTE — Telephone Encounter (Signed)
Rx sent to pharmacy for #45 and 0 refills.

## 2020-07-11 ENCOUNTER — Telehealth: Payer: Self-pay | Admitting: Cardiovascular Disease

## 2020-07-11 NOTE — Telephone Encounter (Signed)
Patient calling to see if he should be able to receive the booster shot for covid since he has a fib.  Please advise

## 2020-07-12 NOTE — Telephone Encounter (Signed)
Returned the patients call. [email or web address]. 

## 2020-07-12 NOTE — Telephone Encounter (Signed)
Spoke with the patient. Adv the patient that the COVID vaccine booster is currently recommended for patient that are immunocompromised. His AFIB dx does not qualify. Adv the patient that the COVID vaccine booster may be available for everyone who has been fully vaccinated >62months sometime in Sept 2021, recommended he talk with his pcp regarding.  Patient sts that Dr. Kirke Corin previously told him that he is not a candidate for a AFIB ablation. He talked with a physician friend of his who suggested he discuss having a "radio frequency ablation" and wanted Dr. Jari Sportsman thoughts. Adv the patient that I will fwd the message to Dr. Kirke Corin.

## 2020-07-12 NOTE — Telephone Encounter (Signed)
A. fib ablation is not very effective for permanent atrial fibrillation.  In addition, I only recommend ablation if significant symptoms are present related to atrial fibrillation.  He does not have that.

## 2020-07-12 NOTE — Telephone Encounter (Signed)
Patient returning call.

## 2020-07-13 NOTE — Telephone Encounter (Signed)
Spoke with the patient. Patient made aware of Dr. Jari Sportsman response and recommendation below. Patient verbalized understanding and voiced appreciation for the call back.

## 2020-07-22 ENCOUNTER — Telehealth: Payer: Self-pay | Admitting: Cardiovascular Disease

## 2020-07-22 NOTE — Telephone Encounter (Signed)
Please call to discuss if patient needs to stay on Losartan

## 2020-07-22 NOTE — Telephone Encounter (Signed)
Called and spoke with patient and informed him that he is still prescribed the Losartan 12.5 mg daily, and reminded him that he does need to make a follow up appointment for additional refills. He stated that he will make a follow up appointment after his echo scheduled in September.

## 2020-07-24 ENCOUNTER — Other Ambulatory Visit: Payer: Self-pay | Admitting: Cardiovascular Disease

## 2020-07-26 ENCOUNTER — Telehealth: Payer: Self-pay | Admitting: Cardiovascular Disease

## 2020-07-26 NOTE — Telephone Encounter (Signed)
Refill request

## 2020-07-26 NOTE — Telephone Encounter (Signed)
Pt c/o medication issue:  1. Name of Medication: losartan  2. How are you currently taking this medication (dosage and times per day)? 25 mg daily. This was cut in half back in May  3. Are you having a reaction (difficulty breathing--STAT)? BP is increasing  4. What is your medication issue? Patient curious if he needs to increase him losartan again now that BP is going back up (140/90) Please advise

## 2020-07-26 NOTE — Telephone Encounter (Signed)
Ok to increase losartan back to 25 mg daily. If BP remains on the higher side or soft, please let us know.

## 2020-07-26 NOTE — Telephone Encounter (Signed)
Called to give the patient Jeffery Shaw, PAs response. [email or web address].

## 2020-07-26 NOTE — Telephone Encounter (Addendum)
Patient was seen back in May 2021 by Eula Listen, PA. Patients BP was low during the visit 90/70. Ryan reduced the patients Losartan to a1/2 tab 12.5 mg qd.  Patients sts that he had taken his clonazepam just before the appt which conrtibuted to his low BP reading. He rechecked his BP when he arrived home and his BP returned to normal.  Patient sts that his BP has been elevated the last couple of days 155/9 , 140/85. Patient sts that he will then take an additional 12.5mg  of losartan to total 25 mg and his BP will normalize.  Patient sts that he was previously on Losartan 25 mg for several years and he did fine. Patient would like to resume Losartan 25 mg qd to help optimize his BP. Patient sts that if Alycia Rossetti is in agreement he will need an Rx sent to his pharmacy.

## 2020-07-27 MED ORDER — LOSARTAN POTASSIUM 25 MG PO TABS: 25.0000 mg | ORAL_TABLET | Freq: Every day | ORAL | 1 refills | Status: DC

## 2020-07-27 NOTE — Telephone Encounter (Signed)
Patient made aware of Eula Listen, PA response and recommendation. Rx for losartan 25 mg daily has been sent to the patients pharmacy. Patient voiced appreciation for the assistance.

## 2020-07-28 ENCOUNTER — Other Ambulatory Visit: Payer: Medicare Other

## 2020-08-18 ENCOUNTER — Other Ambulatory Visit: Payer: Medicare Other

## 2020-09-15 ENCOUNTER — Other Ambulatory Visit: Payer: Medicare Other

## 2020-10-14 ENCOUNTER — Other Ambulatory Visit: Payer: Self-pay | Admitting: Cardiovascular Disease

## 2020-10-17 ENCOUNTER — Telehealth: Payer: Self-pay | Admitting: Physician Assistant

## 2020-10-17 NOTE — Telephone Encounter (Signed)
Please call to discuss Digoxin. Also, please advise if patient needs to keep 12/2 appointment with Dr. Kirke Corin.

## 2020-10-17 NOTE — Telephone Encounter (Signed)
Rx request sent to pharmacy.  

## 2020-10-17 NOTE — Telephone Encounter (Signed)
Patient wanted to make sure we had sent in his refill and that he did need his upcoming appointment. Confirmed the refill and that appointment was needed. Patient appreciative and verbalized understanding.

## 2020-10-18 ENCOUNTER — Other Ambulatory Visit: Payer: Medicare Other

## 2020-10-20 ENCOUNTER — Ambulatory Visit (INDEPENDENT_AMBULATORY_CARE_PROVIDER_SITE_OTHER): Payer: Medicare Other | Admitting: Cardiovascular Disease

## 2020-10-20 ENCOUNTER — Encounter: Payer: Self-pay | Admitting: Cardiovascular Disease

## 2020-10-20 ENCOUNTER — Other Ambulatory Visit: Payer: Self-pay

## 2020-10-20 VITALS — BP 150/90 | HR 81 | Ht 72.0 in | Wt 172.0 lb

## 2020-10-20 DIAGNOSIS — E785 Hyperlipidemia, unspecified: Secondary | ICD-10-CM

## 2020-10-20 DIAGNOSIS — I4821 Permanent atrial fibrillation: Secondary | ICD-10-CM

## 2020-10-20 DIAGNOSIS — I5022 Chronic systolic (congestive) heart failure: Secondary | ICD-10-CM

## 2020-10-20 NOTE — Progress Notes (Signed)
Cardiology Office Note   Date:  10/20/2020   ID:  Jeffery Shaw, Park Meo 1946-03-19, MRN [id number]  PCP:  Jeffery Code, MD  Cardiologist:   Jeffery Bears, MD   Chief Complaint  Patient presents with  . office visit    6 month F/U; Meds verbally reviewed with patient.      History of Present Illness: Jeffery Shaw is a 74 y.o. male who presents for a followup visit  regarding chronic systolic heart failure, chronic atrial fibrillation and a small PFO.  TEE in 2013 showed an ejection fraction of 30-35% with normal atrial size. There was a tunneled PFO with a small to moderate left atrial shunting by color Doppler. A right and left cardiac catheterization in 07/2012  showed no significant coronary artery disease with a QP/QS shunt ratio of 1.37.  Most recent echocardiogram in February 2015 showed an ejection fraction of 40-45%. He has sleep apnea on CPAP. He did not maintain in sinus rhythm after cardioversion even with amiodarone and thus he is being treated with rate control. He has been doing reasonably well with no recent chest pain, shortness of breath or palpitations.  He is under significant stress as he is still trying to go through separation from his wife.  He takes his medications regularly.     Past Medical History:  Diagnosis Date  . Asthma   . Chronic atrial fibrillation (HCC)    a. Rate-controlled; b. CHA2DS2VASc = 2--> Eliquis.  . Chronic systolic heart failure (HCC)    a. 07/2012 TEE: EF 30-35%; b. 07/2012 Cath: no significant coronary artery disease with an ejection fraction of 35%. Normal filling pressures with only mild PAH; c. 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 Nl PASP.  [name] Emphysema, unspecified (HCC)   . GERD (gastroesophageal reflux disease)   . Hyperlipidemia   . Hypothyroidism   . Mildly dilated aortic root (HCC)    a. 12/2013 Echo: Ao root 87mm.  [name] NICM (nonischemic  cardiomyopathy) (HCC)    a. 07/2012 TEE: EF 30-35%; b. 07/2012 Cath: nonobs dzs, EF 35%; c. 12/2013 Echo: EF 40-45%.  . Palpitations   . Pancreatitis 1980   due to ETOH  . Panic attacks   . PFO (patent foramen ovale)    a. 2013 TEE & Cath: Tunnelled PFO with a small left-to-right atrial shunt noted on TEE with a QP/QS ratio of 1.37 by cardiac cath; b. 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  . Sleep apnea    CPAP    Past Surgical History:  Procedure Laterality Date  . CARDIAC CATHETERIZATION  07/2012   [facility]  . CARDIOVERSION  09/15/12  . CATARACT EXTRACTION W/PHACO Left 04/17/2016   Procedure: CATARACT EXTRACTION PHACO AND INTRAOCULAR LENS PLACEMENT (IOC);  Surgeon: Jeffery Manila, MD;  Location: [facility] ORS;  Service: Ophthalmology;  Laterality: Left;  [place] 32.9 AP% 20.1 CDE 6.59 FLUID PACK LOT # [id number]  . CATARACT EXTRACTION W/PHACO Right 05/21/2016   Procedure: CATARACT EXTRACTION PHACO AND INTRAOCULAR LENS PLACEMENT (IOC);  Surgeon: Jeffery Manila, MD;  Location: [facility] ORS;  Service: Ophthalmology;  Laterality: Right;  [place] 31.0 AP% 20.4 CDE 6.30 Fluid Pck Lot # [id number] H  . CYSTOSCOPY W/ URETEROSCOPY    . TEE WITHOUT CARDIOVERSION       Current Outpatient Medications  Medication Sig Dispense Refill  . amoxicillin (AMOXIL) 875 MG tablet Take  1 tablet (875 mg total) by mouth 2 (two) times daily. 20 tablet 0  . buPROPion (WELLBUTRIN SR) 100 MG 12 hr tablet Take 100 mg by mouth daily.    . clonazePAM (KLONOPIN) 0.5 MG tablet Take 2.5 mg by mouth 2 (two) times daily as needed for anxiety.    . digoxin (LANOXIN) 0.125 MG tablet TAKE 1 TABLET(125 MCG) BY MOUTH DAILY 30 tablet 5  . ELIQUIS 5 MG TABS tablet TAKE 1 TABLET(5 MG) BY MOUTH TWICE DAILY 90 tablet 3  . fluticasone (FLONASE) 50 MCG/ACT nasal spray SHAKE LIQUID AND USE 2 SPRAYS IN EACH NOSTRIL TWICE DAILY 96 g 1  . furosemide (LASIX) 20 MG tablet TAKE 1 TABLET BY MOUTH EVERY DAY AS NEEDED 90 tablet 3  . levothyroxine  (SYNTHROID) 75 MCG tablet Take 1 tablet (75 mcg total) by mouth daily. (Patient taking differently: Take 75 mcg by mouth daily. Not taking on a regular basis) 30 tablet 3  . losartan (COZAAR) 25 MG tablet Take 1 tablet (25 mg total) by mouth daily. 90 tablet 1  . metoprolol tartrate (LOPRESSOR) 100 MG tablet TAKE 1 TABLET(100 MG) BY MOUTH TWICE DAILY 180 tablet 1  . pneumococcal 23 valent vaccine (PNEUMOVAX 23) 25 MCG/0.5ML injection Inject 0.8ml IM once 0.5 mL 0  . rOPINIRole (REQUIP) 0.5 MG tablet Take 1 tablet (0.5 mg total) by mouth as needed. 30 tablet 3  . sertraline (ZOLOFT) 100 MG tablet Take 50 mg by mouth daily.     [name] VITAMIN D PO Take by mouth daily.     No current facility-administered medications for this visit.    Allergies:   Patient has no known allergies.    Social History:  The patient  reports that he quit smoking about 12 years ago. His smoking use included cigarettes. He has a 10.00 pack-year smoking history. He has never used smokeless tobacco. He reports that he does not drink alcohol and does not use drugs.   Family History:  The patient's family history includes Arrhythmia in his sister; Hyperlipidemia in his brother.    ROS:  Please see the history of present illness.   Otherwise, review of systems are positive for none.   All other systems are reviewed and negative.    PHYSICAL EXAM: VS:  BP (!) 150/90 (BP Location: Left Arm, Patient Position: Sitting, Cuff Size: Normal)   Pulse 81   Ht 6' (1.829 m)   Wt 172 lb (78 kg)   SpO2 98%   BMI 23.33 kg/m  , BMI Body mass index is 23.33 kg/m. GEN: Well nourished, well developed, in no acute distress  HEENT: normal  Neck: no JVD, carotid bruits, or masses Cardiac: Irregularly irregular; no murmurs, rubs, or gallops,no edema  Respiratory:  clear to auscultation bilaterally, normal work of breathing GI: soft, nontender, nondistended, + BS MS: no deformity or atrophy  Skin: warm and dry, no rash Neuro:  Strength  and sensation are intact Psych: euthymic mood, full affect   EKG:  EKG is ordered today. The ekg ordered today demonstrates atrial fibrillation with normal ventricular rate.  Heart rate is 81 bpm.   Recent Labs: 12/11/2019: ALT 25; TSH 2.480 04/13/2020: BUN 23; Creatinine, Ser 1.21; Hemoglobin 15.8; Platelets 275; Potassium 4.9; Sodium 140    Lipid Panel    Component Value Date/Time   CHOL 178 12/11/2019 0924   TRIG 77 12/11/2019 0924   HDL 56 12/11/2019 0924   CHOLHDL 3.2 12/12/2017 0926   LDLCALC 108 (H)  12/11/2019 0924      Wt Readings from Last 3 Encounters:  10/20/20 172 lb (78 kg)  04/13/20 172 lb 4 oz (78.1 kg)  03/10/20 160 lb (72.6 kg)        ASSESSMENT AND PLAN:  1.  Chronic atrial fibrillation: Ventricular rate is well controlled on metoprolol and small dose digoxin. He is tolerating anticoagulation with no side effects.  most recent labs in May were unremarkable with optimal digoxin level.  2. Chronic systolic heart failure: He has no symptoms of volume overload and currently uses furosemide as needed. Continue metoprolol and low-dose losartan.  He is scheduled for an echocardiogram later this month.  If EF is below 40%, the plan is to switch from losartan to Entresto and change metoprolol tartrate to metoprolol succinate.  3. Small PFO: We will check with upcoming echocardiogram  4. Hyperlipidemia: Currently on simvastatin .  Most recent lipid profile showed an LDL of 108.    Disposition:   FU with me in 6 months  Signed,  Jeffery Bears, MD  10/20/2020 2:54 PM    [facility]

## 2020-10-20 NOTE — Patient Instructions (Signed)
Medication Instructions:  No medication change   *If you need a refill on your cardiac medications before your next appointment, please call your pharmacy*   Lab Work: None If you have labs (blood work) drawn today and your tests are completely normal, you will receive your results only by:  MyChart Message (if you have MyChart) OR  A paper copy in the mail If you have any lab test that is abnormal or we need to change your treatment, we will call you to review the results.   Testing/Procedures: Please keep your echocardiogram schedule for 11/15/20 at 1pm   Follow-Up: At [facility], you and your health needs are our priority.  As part of our continuing mission to provide you with exceptional heart care, we have created designated Provider Care Teams.  These Care Teams include your primary Cardiologist (physician) and Advanced Practice Providers (APPs -  Physician Assistants and Nurse Practitioners) who all work together to provide you with the care you need, when you need it.  We recommend signing up for the patient portal called "MyChart".  Sign up information is provided on this After Visit Summary.  MyChart is used to connect with patients for Virtual Visits (Telemedicine).  Patients are able to view lab/test results, encounter notes, upcoming appointments, etc.  Non-urgent messages can be sent to your provider as well.   To learn more about what you can do with MyChart, go to [email or web address].    Your next appointment:   6 month(s)  The format for your next appointment:   In Person  Provider:   You may see Lorine Bears, MD or one of the following Advanced Practice Providers on your designated Care Team:    Nicolasa Ducking, NP  Eula Listen, PA-C  Marisue Ivan, PA-C  Cadence Crocker, [place]  Gillian Shields, NP    Other Instructions None

## 2020-11-02 ENCOUNTER — Telehealth: Payer: Self-pay | Admitting: Internal Medicine

## 2020-11-02 MED ORDER — APIXABAN 5 MG PO TABS: 5.0000 mg | ORAL_TABLET | Freq: Two times a day (BID) | ORAL | 1 refills | Status: DC

## 2020-11-02 NOTE — Telephone Encounter (Signed)
New refill sent in as requested.

## 2020-11-02 NOTE — Telephone Encounter (Signed)
Received fax from [facility] in [place] requesting new Rx for Eliquis 5 mg--90 day supply. States "Last Rx was for 90 tablets, needs 180 tablets."  Looks like it was last sent in on 07/26/20. Please review for refill. Thanks!

## 2020-11-10 ENCOUNTER — Other Ambulatory Visit: Payer: Medicare Other

## 2020-11-15 ENCOUNTER — Other Ambulatory Visit: Payer: Medicare Other

## 2020-11-23 DIAGNOSIS — F410 Panic disorder [episodic paroxysmal anxiety] without agoraphobia: Secondary | ICD-10-CM | POA: Diagnosis not present

## 2020-11-24 ENCOUNTER — Other Ambulatory Visit: Payer: Medicare Other

## 2020-11-25 NOTE — Telephone Encounter (Signed)
error 

## 2020-11-28 ENCOUNTER — Ambulatory Visit: Payer: Medicare Other | Admitting: Nurse Practitioner

## 2020-11-28 DIAGNOSIS — J301 Allergic rhinitis due to pollen: Secondary | ICD-10-CM | POA: Diagnosis not present

## 2020-12-02 ENCOUNTER — Telehealth: Payer: Self-pay

## 2020-12-02 NOTE — Telephone Encounter (Signed)
Rescheduled missed ov from 11-28-20. Toni Amend

## 2020-12-07 DIAGNOSIS — J301 Allergic rhinitis due to pollen: Secondary | ICD-10-CM | POA: Diagnosis not present

## 2020-12-12 DIAGNOSIS — J301 Allergic rhinitis due to pollen: Secondary | ICD-10-CM | POA: Diagnosis not present

## 2020-12-19 DIAGNOSIS — J301 Allergic rhinitis due to pollen: Secondary | ICD-10-CM | POA: Diagnosis not present

## 2020-12-26 DIAGNOSIS — J301 Allergic rhinitis due to pollen: Secondary | ICD-10-CM | POA: Diagnosis not present

## 2020-12-30 DIAGNOSIS — J301 Allergic rhinitis due to pollen: Secondary | ICD-10-CM | POA: Diagnosis not present

## 2021-01-02 DIAGNOSIS — F410 Panic disorder [episodic paroxysmal anxiety] without agoraphobia: Secondary | ICD-10-CM | POA: Diagnosis not present

## 2021-01-02 DIAGNOSIS — F332 Major depressive disorder, recurrent severe without psychotic features: Secondary | ICD-10-CM | POA: Diagnosis not present

## 2021-01-02 DIAGNOSIS — J301 Allergic rhinitis due to pollen: Secondary | ICD-10-CM | POA: Diagnosis not present

## 2021-01-05 ENCOUNTER — Other Ambulatory Visit: Payer: Medicare Other

## 2021-01-09 DIAGNOSIS — J301 Allergic rhinitis due to pollen: Secondary | ICD-10-CM | POA: Diagnosis not present

## 2021-01-16 DIAGNOSIS — J301 Allergic rhinitis due to pollen: Secondary | ICD-10-CM | POA: Diagnosis not present

## 2021-01-23 DIAGNOSIS — F410 Panic disorder [episodic paroxysmal anxiety] without agoraphobia: Secondary | ICD-10-CM | POA: Diagnosis not present

## 2021-01-23 DIAGNOSIS — F332 Major depressive disorder, recurrent severe without psychotic features: Secondary | ICD-10-CM | POA: Diagnosis not present

## 2021-01-23 DIAGNOSIS — J301 Allergic rhinitis due to pollen: Secondary | ICD-10-CM | POA: Diagnosis not present

## 2021-01-24 ENCOUNTER — Ambulatory Visit (INDEPENDENT_AMBULATORY_CARE_PROVIDER_SITE_OTHER): Payer: Medicare Other | Admitting: Hospice and Palliative Medicine

## 2021-01-24 ENCOUNTER — Encounter: Payer: Self-pay | Admitting: Hospice and Palliative Medicine

## 2021-01-24 ENCOUNTER — Other Ambulatory Visit: Payer: Self-pay

## 2021-01-24 VITALS — BP 119/84 | HR 88 | Temp 98.0°F | Resp 16 | Ht 72.0 in | Wt 174.0 lb

## 2021-01-24 DIAGNOSIS — Q211 Atrial septal defect: Secondary | ICD-10-CM

## 2021-01-24 DIAGNOSIS — I4891 Unspecified atrial fibrillation: Secondary | ICD-10-CM | POA: Diagnosis not present

## 2021-01-24 DIAGNOSIS — Z1211 Encounter for screening for malignant neoplasm of colon: Secondary | ICD-10-CM | POA: Diagnosis not present

## 2021-01-24 DIAGNOSIS — R5383 Other fatigue: Secondary | ICD-10-CM | POA: Diagnosis not present

## 2021-01-24 DIAGNOSIS — R351 Nocturia: Secondary | ICD-10-CM | POA: Diagnosis not present

## 2021-01-24 DIAGNOSIS — Z0001 Encounter for general adult medical examination with abnormal findings: Secondary | ICD-10-CM

## 2021-01-24 DIAGNOSIS — Q2112 Patent foramen ovale: Secondary | ICD-10-CM

## 2021-01-24 DIAGNOSIS — R300 Dysuria: Secondary | ICD-10-CM | POA: Diagnosis not present

## 2021-01-24 DIAGNOSIS — E039 Hypothyroidism, unspecified: Secondary | ICD-10-CM

## 2021-01-24 DIAGNOSIS — Z125 Encounter for screening for malignant neoplasm of prostate: Secondary | ICD-10-CM | POA: Diagnosis not present

## 2021-01-24 MED ORDER — LEVOTHYROXINE SODIUM 75 MCG PO TABS
75.0000 ug | ORAL_TABLET | Freq: Every day | ORAL | 3 refills | Status: DC
Start: 2021-01-24 — End: 2021-01-26

## 2021-01-24 NOTE — Progress Notes (Signed)
[facility] [address] [place], [place] [address]   Internal MEDICINE  Office Visit Note  Patient Name: Jeffery Shaw  [id number]  [id number]  Date of Service: 01/25/2021  Chief Complaint  Patient presents with  . Medicare Wellness  . Anxiety  . Hyperlipidemia  . Gastroesophageal Reflux  . COPD     HPI Pt is here for routine health maintenance examination Has been under a tremendous amount of stress due to ongoing separation and divorce from his spouse Not sleeping well, constantly on edge and stressed Followed and managed by psychiatry Followed by cardiology for CHF, A. Fib, and PFO//scheduled for routine echocardiogram for monitoring later this month Continues to sleep with CPAP nightly for OSA  Looking forward to his divorce being finalized as he feels he is living under a microscope and is anxious about every decision he is making  Due for repeat colonoscopy and routine labs  Current Medication: Outpatient Encounter Medications as of 01/24/2021  Medication Sig  . apixaban (ELIQUIS) 5 MG TABS tablet Take 1 tablet (5 mg total) by mouth 2 (two) times daily.  [name] buPROPion (WELLBUTRIN SR) 100 MG 12 hr tablet Take 100 mg by mouth daily.  . clonazePAM (KLONOPIN) 0.5 MG tablet Take 2.5 mg by mouth 2 (two) times daily as needed for anxiety.  . digoxin (LANOXIN) 0.125 MG tablet TAKE 1 TABLET(125 MCG) BY MOUTH DAILY  . fluticasone (FLONASE) 50 MCG/ACT nasal spray SHAKE LIQUID AND USE 2 SPRAYS IN EACH NOSTRIL TWICE DAILY  . furosemide (LASIX) 20 MG tablet TAKE 1 TABLET BY MOUTH EVERY DAY AS NEEDED  . losartan (COZAAR) 25 MG tablet Take 1 tablet (25 mg total) by mouth daily.  . metoprolol tartrate (LOPRESSOR) 100 MG tablet TAKE 1 TABLET(100 MG) BY MOUTH TWICE DAILY  . rOPINIRole (REQUIP) 0.5 MG tablet Take 1 tablet (0.5 mg total) by mouth as needed.  . sertraline (ZOLOFT) 100 MG tablet Take 100 mg by mouth daily. As per pt her taking .75  . VITAMIN D PO Take by mouth  daily.  . [DISCONTINUED] levothyroxine (SYNTHROID) 75 MCG tablet Take 1 tablet (75 mcg total) by mouth daily. (Patient taking differently: Take 75 mcg by mouth daily. Not taking on a regular basis)  . [DISCONTINUED] sertraline (ZOLOFT) 100 MG tablet Take 50 mg by mouth daily.   [name] levothyroxine (SYNTHROID) 75 MCG tablet Take 1 tablet (75 mcg total) by mouth daily.  . pneumococcal 23 valent vaccine (PNEUMOVAX 23) 25 MCG/0.5ML injection Inject 0.36ml IM once (Patient not taking: Reported on 01/24/2021)  . [DISCONTINUED] amoxicillin (AMOXIL) 875 MG tablet Take 1 tablet (875 mg total) by mouth 2 (two) times daily.   No facility-administered encounter medications on file as of 01/24/2021.    Surgical History: Past Surgical History:  Procedure Laterality Date  . CARDIAC CATHETERIZATION  07/2012   [facility]  . CARDIOVERSION  09/15/12  . CATARACT EXTRACTION W/PHACO Left 04/17/2016   Procedure: CATARACT EXTRACTION PHACO AND INTRAOCULAR LENS PLACEMENT (IOC);  Surgeon: Galen Manila, MD;  Location: [facility] ORS;  Service: Ophthalmology;  Laterality: Left;  [place] 32.9 AP% 20.1 CDE 6.59 FLUID PACK LOT # [id number] H  . CATARACT EXTRACTION W/PHACO Right 05/21/2016   Procedure: CATARACT EXTRACTION PHACO AND INTRAOCULAR LENS PLACEMENT (IOC);  Surgeon: Galen Manila, MD;  Location: [facility] ORS;  Service: Ophthalmology;  Laterality: Right;  [place] 31.0 AP% 20.4 CDE 6.30 Fluid Pck Lot # [id number] H  . CYSTOSCOPY W/ URETEROSCOPY    . TEE WITHOUT CARDIOVERSION  Medical History: Past Medical History:  Diagnosis Date  . Asthma   . Cataract    both eyes  . Chronic atrial fibrillation (HCC)    a. Rate-controlled; b. CHA2DS2VASc = 2--> Eliquis.  . Chronic systolic heart failure (HCC)    a. 07/2012 TEE: EF 30-35%; b. 07/2012 Cath: no significant coronary artery disease with an ejection fraction of 35%. Normal filling pressures with only mild PAH; c. 12/2013 Echo: EF 40-45%, no rwma, mildly dil Ao root and LA/RA, mild MR, small  secundum ASD w/ small L->R shunt. Nl PASP.  [name] Emphysema, unspecified (HCC)   . GERD (gastroesophageal reflux disease)   . Hyperlipidemia   . Hypothyroidism   . Mildly dilated aortic root (HCC)    a. 12/2013 Echo: Ao root 34mm.  [name] NICM (nonischemic cardiomyopathy) (HCC)    a. 07/2012 TEE: EF 30-35%; b. 07/2012 Cath: nonobs dzs, EF 35%; c. 12/2013 Echo: EF 40-45%.  . Palpitations   . Pancreatitis 1980   due to ETOH  . Panic attacks   . PFO (patent foramen ovale)    a. 2013 TEE & Cath: Tunnelled PFO with a small left-to-right atrial shunt noted on TEE with a QP/QS ratio of 1.37 by cardiac cath; b. 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  . Sleep apnea    CPAP    Family History: Family History  Problem Relation Age of Onset  . Arrhythmia Sister        A-fib/stroke   . Hyperlipidemia Brother       Review of Systems  Constitutional: Negative for chills, fatigue and unexpected weight change.  HENT: Negative for congestion, postnasal drip, rhinorrhea, sneezing and sore throat.   Eyes: Negative for redness.  Respiratory: Negative for cough, chest tightness and shortness of breath.   Cardiovascular: Negative for chest pain and palpitations.  Gastrointestinal: Negative for abdominal pain, constipation, diarrhea, nausea and vomiting.  Genitourinary: Negative for dysuria and frequency.  Musculoskeletal: Negative for arthralgias, back pain, joint swelling and neck pain.  Skin: Negative for rash.  Neurological: Negative for tremors and numbness.  Hematological: Negative for adenopathy. Does not bruise/bleed easily.  Psychiatric/Behavioral: Positive for behavioral problems (Depression). Negative for sleep disturbance and suicidal ideas. The patient is nervous/anxious.      Vital Signs: BP 119/84   Pulse 88   Temp 98 F (36.7 C)   Resp 16   Ht 6' (1.829 m)   Wt 174 lb (78.9 kg)   SpO2 96%   BMI 23.60 kg/m    Physical Exam Vitals reviewed.  Constitutional:       Appearance: Normal appearance. He is normal weight.  Cardiovascular:     Rate and Rhythm: Normal rate and regular rhythm.     Pulses: Normal pulses.     Heart sounds: Normal heart sounds.  Pulmonary:     Effort: Pulmonary effort is normal.     Breath sounds: Normal breath sounds.  Abdominal:     General: Abdomen is flat.     Palpations: Abdomen is soft.  Musculoskeletal:        General: Normal range of motion.     Cervical back: Normal range of motion.  Skin:    General: Skin is warm.  Neurological:     General: No focal deficit present.     Mental Status: He is alert and oriented to person, place, and time. Mental status is at baseline.  Psychiatric:        Mood and Affect: Mood  normal.        Behavior: Behavior normal.        Thought Content: Thought content normal.        Judgment: Judgment normal.      LABS: Recent Results (from the past 2160 hour(s))  UA/M w/rflx Culture, Routine     Status: Abnormal   Collection Time: 01/24/21  4:30 PM   Specimen: Urine   Urine  Result Value Ref Range   Specific Gravity, UA 1.029 1.005 - 1.030   pH, UA 5.5 5.0 - 7.5   Color, UA Yellow Yellow   Appearance Ur Turbid (A) Clear   Leukocytes,UA Negative Negative   Protein,UA Negative Negative/Trace   Glucose, UA Trace (A) Negative   Ketones, UA Trace (A) Negative   RBC, UA Negative Negative   Bilirubin, UA Negative Negative   Urobilinogen, Ur 0.2 0.2 - 1.0 mg/dL   Nitrite, UA Negative Negative   Microscopic Examination Comment     Comment: Microscopic follows if indicated.   Microscopic Examination See below:     Comment: Microscopic was indicated and was performed.   Urinalysis Reflex Comment     Comment: This specimen will not reflex to a Urine Culture.  Microscopic Examination     Status: None   Collection Time: 01/24/21  4:30 PM   Urine  Result Value Ref Range   WBC, UA None seen 0 - 5 /hpf   RBC 0-2 0 - 2 /hpf   Epithelial Cells (non renal) 0-10 0 - 10 /hpf   Casts  None seen None seen /lpf   Bacteria, UA None seen None seen/Few    Assessment/Plan: 1. Encounter for routine adult health examination with abnormal findings Well appearing 75 year old male Order placed to update PHM  2. Screening for prostate cancer - PSA, total and free  3. Screening for colon cancer - Ambulatory referral to Gastroenterology  4. Acquired hypothyroidism Will review levels and adjust dosing as indicated - levothyroxine (SYNTHROID) 75 MCG tablet; Take 1 tablet (75 mcg total) by mouth daily.  Dispense: 30 tablet; Refill: 3  5. Atrial fibrillation, unspecified type (HCC) Rate controlled and anticoagulated, followed by cardiology  6. PFO (patent foramen ovale) Followed by cardiology, scheduled for monitoring echocardiogram later this month  7. Dysuria - UA/M w/rflx Culture, Routine  8. Other fatigue - CBC w/Diff/Platelet - Comprehensive Metabolic Panel (CMET) - TSH + free T4 - Lipid Panel With LDL/HDL Ratio  9. Nocturia  - PSA, total and free  General Counseling: arcadio cope understanding of the findings of todays visit and agrees with plan of treatment. I have discussed any further diagnostic evaluation that may be needed or ordered today. We also reviewed his medications today. he has been encouraged to call the office with any questions or concerns that should arise related to todays visit.    Counseling:    Orders Placed This Encounter  Procedures  . Microscopic Examination  . UA/M w/rflx Culture, Routine  . CBC w/Diff/Platelet  . Comprehensive Metabolic Panel (CMET)  . TSH + free T4  . Lipid Panel With LDL/HDL Ratio  . PSA, total and free  . Ambulatory referral to Gastroenterology    Meds ordered this encounter  Medications  . levothyroxine (SYNTHROID) 75 MCG tablet    Sig: Take 1 tablet (75 mcg total) by mouth daily.    Dispense:  30 tablet    Refill:  3    Total time spent: 35 Minutes  Time spent includes review  of chart,  medications, test results, and follow up plan with the patient.   This patient was seen by Leeanne Deed AGNP-C Collaboration with Dr Lyndon Code as a part of collaborative care agreement   Lubertha Basque. [facility] Internal Medicine

## 2021-01-25 ENCOUNTER — Encounter: Payer: Self-pay | Admitting: Hospice and Palliative Medicine

## 2021-01-25 DIAGNOSIS — R351 Nocturia: Secondary | ICD-10-CM | POA: Diagnosis not present

## 2021-01-25 DIAGNOSIS — E756 Lipid storage disorder, unspecified: Secondary | ICD-10-CM | POA: Diagnosis not present

## 2021-01-25 DIAGNOSIS — R5383 Other fatigue: Secondary | ICD-10-CM | POA: Diagnosis not present

## 2021-01-25 DIAGNOSIS — Z125 Encounter for screening for malignant neoplasm of prostate: Secondary | ICD-10-CM | POA: Diagnosis not present

## 2021-01-25 DIAGNOSIS — E786 Lipoprotein deficiency: Secondary | ICD-10-CM | POA: Diagnosis not present

## 2021-01-25 LAB — MICROSCOPIC EXAMINATION
Bacteria, UA: NONE SEEN
Casts: NONE SEEN /lpf
WBC, UA: NONE SEEN /hpf (ref 0–5)

## 2021-01-25 LAB — UA/M W/RFLX CULTURE, ROUTINE
Bilirubin, UA: NEGATIVE
Leukocytes,UA: NEGATIVE
Nitrite, UA: NEGATIVE
Protein,UA: NEGATIVE
RBC, UA: NEGATIVE
Specific Gravity, UA: 1.029 (ref 1.005–1.030)
Urobilinogen, Ur: 0.2 mg/dL (ref 0.2–1.0)
pH, UA: 5.5 (ref 5.0–7.5)

## 2021-01-26 ENCOUNTER — Telehealth: Payer: Self-pay

## 2021-01-26 ENCOUNTER — Other Ambulatory Visit: Payer: Self-pay | Admitting: Hospice and Palliative Medicine

## 2021-01-26 DIAGNOSIS — E039 Hypothyroidism, unspecified: Secondary | ICD-10-CM

## 2021-01-26 LAB — CBC WITH DIFFERENTIAL/PLATELET
Basophils Absolute: 0.1 10*3/uL (ref 0.0–0.2)
Basos: 1 %
EOS (ABSOLUTE): 0.1 10*3/uL (ref 0.0–0.4)
Eos: 1 %
Hematocrit: 47.1 % (ref 37.5–51.0)
Hemoglobin: 15.9 g/dL (ref 13.0–17.7)
Immature Grans (Abs): 0 10*3/uL (ref 0.0–0.1)
Immature Granulocytes: 0 %
Lymphocytes Absolute: 3.3 10*3/uL — ABNORMAL HIGH (ref 0.7–3.1)
Lymphs: 38 %
MCH: 32.1 pg (ref 26.6–33.0)
MCHC: 33.8 g/dL (ref 31.5–35.7)
MCV: 95 fL (ref 79–97)
Monocytes Absolute: 0.8 10*3/uL (ref 0.1–0.9)
Monocytes: 9 %
Neutrophils Absolute: 4.4 10*3/uL (ref 1.4–7.0)
Neutrophils: 51 %
Platelets: 277 10*3/uL (ref 150–450)
RBC: 4.95 x10E6/uL (ref 4.14–5.80)
RDW: 13.3 % (ref 11.6–15.4)
WBC: 8.8 10*3/uL (ref 3.4–10.8)

## 2021-01-26 LAB — COMPREHENSIVE METABOLIC PANEL
ALT: 18 IU/L (ref 0–44)
AST: 19 IU/L (ref 0–40)
Albumin/Globulin Ratio: 1.7 (ref 1.2–2.2)
Albumin: 4 g/dL (ref 3.7–4.7)
Alkaline Phosphatase: 50 IU/L (ref 44–121)
BUN/Creatinine Ratio: 11 (ref 10–24)
BUN: 12 mg/dL (ref 8–27)
Bilirubin Total: 1.3 mg/dL — ABNORMAL HIGH (ref 0.0–1.2)
CO2: 24 mmol/L (ref 20–29)
Calcium: 8.7 mg/dL (ref 8.6–10.2)
Chloride: 102 mmol/L (ref 96–106)
Creatinine, Ser: 1.07 mg/dL (ref 0.76–1.27)
Globulin, Total: 2.3 g/dL (ref 1.5–4.5)
Glucose: 96 mg/dL (ref 65–99)
Potassium: 4.2 mmol/L (ref 3.5–5.2)
Sodium: 141 mmol/L (ref 134–144)
Total Protein: 6.3 g/dL (ref 6.0–8.5)
eGFR: 73 mL/min/{1.73_m2} (ref >59)

## 2021-01-26 LAB — TSH+FREE T4
Free T4: 1.33 ng/dL (ref 0.82–1.77)
TSH: 6.9 u[IU]/mL — ABNORMAL HIGH (ref 0.450–4.500)

## 2021-01-26 LAB — LIPID PANEL WITH LDL/HDL RATIO
Cholesterol, Total: 183 mg/dL (ref 100–199)
HDL: 52 mg/dL (ref >39)
LDL Chol Calc (NIH): 109 mg/dL — ABNORMAL HIGH (ref 0–99)
LDL/HDL Ratio: 2.1 ratio (ref 0.0–3.6)
Triglycerides: 124 mg/dL (ref 0–149)
VLDL Cholesterol Cal: 22 mg/dL (ref 5–40)

## 2021-01-26 LAB — PSA, TOTAL AND FREE
PSA, Free Pct: 10.8 %
PSA, Free: 0.28 ng/mL
Prostate Specific Ag, Serum: 2.6 ng/mL (ref 0.0–4.0)

## 2021-01-26 MED ORDER — LEVOTHYROXINE SODIUM 88 MCG PO TABS: 88.0000 ug | ORAL_TABLET | Freq: Every day | ORAL | 3 refills | Status: DC

## 2021-01-26 NOTE — Telephone Encounter (Signed)
-----   Message from Theotis Burrow, NP sent at 01/26/2021  8:50 AM EST ----- Please let patient know that I have reviewed his labs and that I am increasing his dose of levothyroxine to 88 mcg. I have also ordered repeat labs for him to have done 6 weeks after starting increased dose. Please mail him the lab order as well. Thanks.

## 2021-01-26 NOTE — Progress Notes (Signed)
Please let patient know that I have reviewed his labs and that I am increasing his dose of levothyroxine to 88 mcg. I have also ordered repeat labs for him to have done 6 weeks after starting increased dose. Please mail him the lab order as well. Thanks.

## 2021-01-26 NOTE — Telephone Encounter (Signed)
Spoke with pt, informed him labs were reviewed, levothyroxine dose was increased to 88 mcg, and repeat labs need to be done 6 weeks after starting new dose of med. Mailed printed labs to [address] [place] [address]

## 2021-01-30 DIAGNOSIS — J301 Allergic rhinitis due to pollen: Secondary | ICD-10-CM | POA: Diagnosis not present

## 2021-02-06 DIAGNOSIS — F410 Panic disorder [episodic paroxysmal anxiety] without agoraphobia: Secondary | ICD-10-CM | POA: Diagnosis not present

## 2021-02-06 DIAGNOSIS — J301 Allergic rhinitis due to pollen: Secondary | ICD-10-CM | POA: Diagnosis not present

## 2021-02-06 DIAGNOSIS — F332 Major depressive disorder, recurrent severe without psychotic features: Secondary | ICD-10-CM | POA: Diagnosis not present

## 2021-02-12 ENCOUNTER — Other Ambulatory Visit: Payer: Self-pay | Admitting: Cardiovascular Disease

## 2021-02-13 DIAGNOSIS — J301 Allergic rhinitis due to pollen: Secondary | ICD-10-CM | POA: Diagnosis not present

## 2021-02-13 NOTE — Telephone Encounter (Signed)
Rx request sent to pharmacy.  

## 2021-02-20 DIAGNOSIS — J301 Allergic rhinitis due to pollen: Secondary | ICD-10-CM | POA: Diagnosis not present

## 2021-02-20 DIAGNOSIS — F410 Panic disorder [episodic paroxysmal anxiety] without agoraphobia: Secondary | ICD-10-CM | POA: Diagnosis not present

## 2021-02-20 DIAGNOSIS — F332 Major depressive disorder, recurrent severe without psychotic features: Secondary | ICD-10-CM | POA: Diagnosis not present

## 2021-02-27 DIAGNOSIS — J301 Allergic rhinitis due to pollen: Secondary | ICD-10-CM | POA: Diagnosis not present

## 2021-03-06 DIAGNOSIS — J301 Allergic rhinitis due to pollen: Secondary | ICD-10-CM | POA: Diagnosis not present

## 2021-03-08 DIAGNOSIS — F410 Panic disorder [episodic paroxysmal anxiety] without agoraphobia: Secondary | ICD-10-CM | POA: Diagnosis not present

## 2021-03-08 DIAGNOSIS — F332 Major depressive disorder, recurrent severe without psychotic features: Secondary | ICD-10-CM | POA: Diagnosis not present

## 2021-03-13 DIAGNOSIS — J301 Allergic rhinitis due to pollen: Secondary | ICD-10-CM | POA: Diagnosis not present

## 2021-03-17 ENCOUNTER — Other Ambulatory Visit: Payer: Medicare Other

## 2021-03-20 DIAGNOSIS — J301 Allergic rhinitis due to pollen: Secondary | ICD-10-CM | POA: Diagnosis not present

## 2021-03-22 DIAGNOSIS — Z23 Encounter for immunization: Secondary | ICD-10-CM | POA: Diagnosis not present

## 2021-03-24 DIAGNOSIS — J301 Allergic rhinitis due to pollen: Secondary | ICD-10-CM | POA: Diagnosis not present

## 2021-03-27 DIAGNOSIS — J301 Allergic rhinitis due to pollen: Secondary | ICD-10-CM | POA: Diagnosis not present

## 2021-04-03 DIAGNOSIS — J301 Allergic rhinitis due to pollen: Secondary | ICD-10-CM | POA: Diagnosis not present

## 2021-04-10 DIAGNOSIS — J301 Allergic rhinitis due to pollen: Secondary | ICD-10-CM | POA: Diagnosis not present

## 2021-04-24 DIAGNOSIS — J301 Allergic rhinitis due to pollen: Secondary | ICD-10-CM | POA: Diagnosis not present

## 2021-04-25 ENCOUNTER — Ambulatory Visit: Payer: Medicare Other | Admitting: Cardiovascular Disease

## 2021-05-01 DIAGNOSIS — J301 Allergic rhinitis due to pollen: Secondary | ICD-10-CM | POA: Diagnosis not present

## 2021-05-08 DIAGNOSIS — J301 Allergic rhinitis due to pollen: Secondary | ICD-10-CM | POA: Diagnosis not present

## 2021-05-15 DIAGNOSIS — J301 Allergic rhinitis due to pollen: Secondary | ICD-10-CM | POA: Diagnosis not present

## 2021-05-15 NOTE — Progress Notes (Deleted)
Cardiology Office Note    Date:  05/15/2021   ID:  Jeffery Shaw, Jeffery Shaw 19-Jan-1946, MRN [id number]  PCP:  Lyndon Code, MD  Cardiologist:  Lorine Bears, MD  Electrophysiologist:  None   Chief Complaint: Follow-up  History of Present Illness:   Jeffery Shaw is a 75 y.o. 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   TEE in 2013, showed an EF of 30-35% with normal atrial size along with a tunneled PFO with small to moderate left atrial shunting by color Doppler. A R/LHC in 07/2012 showed no significant CAD with a QP/QS shunt ratio of 1.37. His most recent echo from 12/2013 showed an EF of 40-45% as detailed below. He has been maintained with rate control strategy given prior unsuccessful  DCCV, even on amiodarone.   He was last seen in 10/2020, and was doing reasonably well without chest pain, dyspnea, or palpitations.  He was under significant stress going through a separation with his wife.  ***     Labs independently reviewed: 01/2021 - TC 183, TG 124, HDL 52, LDL 109, TSH 6.9, free T4 normal, BUN 12, serum creatinine 1.07, potassium 4.2, albumin 4.0, AST/ALT normal, Hgb 15.9, PLT 277 03/2020 - digoxin 0.6 11/2019 - A1c 5.5    Past Medical History:  Diagnosis Date   Asthma    Cataract    both eyes   Chronic atrial fibrillation (HCC)    a. Rate-controlled; b. CHA2DS2VASc = 2--> Eliquis.   Chronic systolic heart failure (HCC)    a. 07/2012 TEE: EF 30-35%; b. 07/2012 Cath: no significant coronary artery disease with an ejection fraction of 35%. Normal filling pressures with only mild PAH; c. 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 Nl PASP.   Emphysema, unspecified (HCC)    GERD (gastroesophageal reflux disease)    Hyperlipidemia     Hypothyroidism    Mildly dilated aortic root (HCC)    a. 12/2013 Echo: Ao root 62mm.   NICM (nonischemic cardiomyopathy) (HCC)    a. 07/2012 TEE: EF 30-35%; b. 07/2012 Cath: nonobs dzs, EF 35%; c. 12/2013 Echo: EF 40-45%.   Palpitations    Pancreatitis 1980   due to ETOH   Panic attacks    PFO (patent foramen ovale)    a. 2013 TEE & Cath: Tunnelled PFO with a small left-to-right atrial shunt noted on TEE with a QP/QS ratio of 1.37 by cardiac cath; b. 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   Sleep apnea    CPAP    Past Surgical History:  Procedure Laterality Date   CARDIAC CATHETERIZATION  07/2012   [facility]   CARDIOVERSION  09/15/12   CATARACT EXTRACTION W/PHACO Left 04/17/2016   Procedure: CATARACT EXTRACTION PHACO AND INTRAOCULAR LENS PLACEMENT (IOC);  Surgeon: Galen Manila, MD;  Location: [facility] ORS;  Service: Ophthalmology;  Laterality: Left;  [place] 32.9 AP% 20.1 CDE 6.59 FLUID PACK LOT # [id number]   CATARACT EXTRACTION W/PHACO Right 05/21/2016   Procedure: CATARACT EXTRACTION PHACO AND INTRAOCULAR LENS PLACEMENT (IOC);  Surgeon: Galen Manila, MD;  Location: [facility];  Service: Ophthalmology;  Laterality: Right;  [place] 31.0 AP% 20.4 CDE 6.30 Fluid Pck Lot # [id number]   CYSTOSCOPY W/ URETEROSCOPY     TEE  WITHOUT CARDIOVERSION      Current Medications: No outpatient medications have been marked as taking for the 05/19/21 encounter (Appointment) with Sondra Barges, PA-C.    Allergies:   Patient has no known allergies.   Social History   Socioeconomic History   Marital status: Married    Spouse name: Not on file   Number of children: Not on file   Years of education: Not on file   Highest education level: Not on file  Occupational History   Not on file  Tobacco Use   Smoking status: Former    Packs/day: 0.50    Years: 20.00    Pack years: 10.00    Types: Cigarettes    Quit date: 08/07/2008    Years since quitting: 12.7   Smokeless tobacco: Never  Vaping Use    Vaping Use: Never used  Substance and Sexual Activity   Alcohol use: No   Drug use: No   Sexual activity: Not on file  Other Topics Concern   Not on file  Social History Narrative   Not on file   Social Determinants of Health   Financial Resource Strain: Not on file  Food Insecurity: Not on file  Transportation Needs: Not on file  Physical Activity: Not on file  Stress: Not on file  Social Connections: Not on file     Family History:  The patient's family history includes Arrhythmia in his sister; Hyperlipidemia in his brother.  ROS:   ROS   EKGs/Labs/Other Studies Reviewed:    Studies reviewed were summarized above. The additional studies were reviewed today:  2D echo 12/2013: - Left ventricle: The cavity size was normal. Wall thickness    was normal. Systolic function was mildly to moderately    reduced. The estimated ejection fraction was in the range    of 40% to 45%. Wall motion was normal; there were no    regional wall motion abnormalities. The study is not    technically sufficient to allow evaluation of LV diastolic    function.  - Aorta: Aortic root dimension: 20mm (ED).  - Aortic root: The aortic root was mildly dilated.  - Mitral valve: Mild regurgitation.  - Left atrium: The atrium was mildly dilated.  - Right ventricle: The cavity size was mildly dilated. Wall    thickness was normal.  - Right atrium: The atrium was mildly dilated.  - Atrial septum: There was a small secundum atrial septal    defect. There was a small left-to-right shunt through an    atrial septal defect.  - Pulmonary arteries: Systolic pressure was within the    normal range. __________  2D echo 11/2012: - Left ventricle: The cavity size was normal. There was mild    concentric hypertrophy. Systolic function was moderately    to severely reduced. The estimated ejection fraction was    in the range of 30% to 35%. Diffuse hypokinesis.    Hypokinesis of the anterior myocardium.  Hypokinesis of the    apical myocardium. Left ventricular diastolic function    parameters were normal.  - Aortic valve: Trivial regurgitation.  - Atrial septum: There was a patent foramen ovale.  - Pulmonary arteries: Systolic pressure was within the    normal range.  Impressions:   - Rhythm is atrial fibrillation.   EKG:  EKG is ordered today.  The EKG ordered today demonstrates ***  Recent Labs: 01/25/2021: ALT 18; BUN 12; Creatinine, Ser 1.07; Hemoglobin 15.9; Platelets 277; Potassium 4.2;  Sodium 141; TSH 6.900  Recent Lipid Panel    Component Value Date/Time   CHOL 183 01/25/2021 0915   TRIG 124 01/25/2021 0915   HDL 52 01/25/2021 0915   CHOLHDL 3.2 12/12/2017 0926   LDLCALC 109 (H) 01/25/2021 0915    PHYSICAL EXAM:    VS:  There were no vitals taken for this visit.  BMI: There is no height or weight on file to calculate BMI.  Physical Exam  Wt Readings from Last 3 Encounters:  01/24/21 174 lb (78.9 kg)  10/20/20 172 lb (78 kg)  04/13/20 172 lb 4 oz (78.1 kg)     ASSESSMENT & PLAN:   Permanent A. fib: ***.  CHADS2VASc 2 (CHF, age x 1).  ***  HFrEF secondary to NICM:  Small PFO:   HLD: LDL 109 in 01/2021  OSA:  Disposition: F/u with Dr. Kirke Corin or an APP in ***.   Medication Adjustments/Labs and Tests Ordered: Current medicines are reviewed at length with the patient today.  Concerns regarding medicines are outlined above. Medication changes, Labs and Tests ordered today are summarized above and listed in the Patient Instructions accessible in Encounters.   Signed, Eula Listen, PA-C 05/15/2021 1:43 PM     [facility] [place] [address] [place], [place] [address] [phone]

## 2021-05-17 DIAGNOSIS — F332 Major depressive disorder, recurrent severe without psychotic features: Secondary | ICD-10-CM | POA: Diagnosis not present

## 2021-05-17 DIAGNOSIS — F410 Panic disorder [episodic paroxysmal anxiety] without agoraphobia: Secondary | ICD-10-CM | POA: Diagnosis not present

## 2021-05-19 ENCOUNTER — Ambulatory Visit: Payer: Medicare Other | Admitting: Physician Assistant

## 2021-05-23 DIAGNOSIS — J309 Allergic rhinitis, unspecified: Secondary | ICD-10-CM | POA: Diagnosis not present

## 2021-05-29 DIAGNOSIS — J301 Allergic rhinitis due to pollen: Secondary | ICD-10-CM | POA: Diagnosis not present

## 2021-06-05 DIAGNOSIS — J301 Allergic rhinitis due to pollen: Secondary | ICD-10-CM | POA: Diagnosis not present

## 2021-06-07 DIAGNOSIS — F410 Panic disorder [episodic paroxysmal anxiety] without agoraphobia: Secondary | ICD-10-CM | POA: Diagnosis not present

## 2021-06-07 DIAGNOSIS — F332 Major depressive disorder, recurrent severe without psychotic features: Secondary | ICD-10-CM | POA: Diagnosis not present

## 2021-06-12 DIAGNOSIS — J301 Allergic rhinitis due to pollen: Secondary | ICD-10-CM | POA: Diagnosis not present

## 2021-06-16 DIAGNOSIS — J301 Allergic rhinitis due to pollen: Secondary | ICD-10-CM | POA: Diagnosis not present

## 2021-06-19 DIAGNOSIS — J301 Allergic rhinitis due to pollen: Secondary | ICD-10-CM | POA: Diagnosis not present

## 2021-06-22 NOTE — Progress Notes (Deleted)
Cardiology Office Note    Date:  06/22/2021   ID:  Jeffery Shaw [birth year], MRN [id number]  PCP:  Lyndon Code, MD  Cardiologist:  Lorine Bears, MD  Electrophysiologist:  None   Chief Complaint: Follow-up  History of Present Illness:   Jeffery Shaw is a 75 y.o. 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   TEE in 2013, showed an EF of 30-35% with normal atrial size along with a tunneled PFO with small to moderate left atrial shunting by color Doppler. A R/LHC in 07/2012 showed no significant CAD with a QP/QS shunt ratio of 1.37. His most recent echo from 12/2013 showed an EF of 40-45% as detailed below. He has been maintained with rate control strategy given prior unsuccessful  DCCV, even on amiodarone.  He was last seen in the office in 10/2020 and was doing reasonably well from a cardiac perspective.  He was under significant stress going through a separation with his wife.  ***   Labs independently reviewed: 01/2021 - TC 183, TG 124, HDL 52, LDL 109, TSH elevated at 6.9, free T4 normal, BUN 12, serum creatinine 1.07, potassium 4.2, BUN 4.0, AST/ALT normal, Hgb 15.9, PLT 277 03/2020 - digoxin 0.6 11/2019 - A1c 5.5  Past Medical History:  Diagnosis Date   Asthma    Cataract    both eyes   Chronic atrial fibrillation (HCC)    a. Rate-controlled; b. CHA2DS2VASc = 2--> Eliquis.   Chronic systolic heart failure (HCC)    a. 07/2012 TEE: EF 30-35%; b. 07/2012 Cath: no significant coronary artery disease with an ejection fraction of 35%. Normal filling pressures with only mild PAH; c. 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 Nl PASP.   Emphysema, unspecified (HCC)    GERD (gastroesophageal reflux disease)    Hyperlipidemia    Hypothyroidism     Mildly dilated aortic root (HCC)    a. 12/2013 Echo: Ao root 22mm.   NICM (nonischemic cardiomyopathy) (HCC)    a. 07/2012 TEE: EF 30-35%; b. 07/2012 Cath: nonobs dzs, EF 35%; c. 12/2013 Echo: EF 40-45%.   Palpitations    Pancreatitis 1980   due to ETOH   Panic attacks    PFO (patent foramen ovale)    a. 2013 TEE & Cath: Tunnelled PFO with a small left-to-right atrial shunt noted on TEE with a QP/QS ratio of 1.37 by cardiac cath; b. 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   Sleep apnea    CPAP    Past Surgical History:  Procedure Laterality Date   CARDIAC CATHETERIZATION  07/2012   [facility]   CARDIOVERSION  09/15/12   CATARACT EXTRACTION W/PHACO Left 04/17/2016   Procedure: CATARACT EXTRACTION PHACO AND INTRAOCULAR LENS PLACEMENT (IOC);  Surgeon: Galen Manila, MD;  Location: [facility] ORS;  Service: Ophthalmology;  Laterality: Left;  [place] 32.9 AP% 20.1 CDE 6.59 FLUID PACK LOT # [id number]   CATARACT EXTRACTION W/PHACO Right 05/21/2016   Procedure: CATARACT EXTRACTION PHACO AND INTRAOCULAR LENS PLACEMENT (IOC);  Surgeon: Galen Manila, MD;  Location: [facility] ORS;  Service: Ophthalmology;  Laterality: Right;  [place] 31.0 AP% 20.4 CDE 6.30 Fluid Pck Lot # [id number] H   CYSTOSCOPY W/ URETEROSCOPY     TEE WITHOUT CARDIOVERSION  Current Medications: No outpatient medications have been marked as taking for the 06/28/21 encounter (Appointment) with Sondra Barges, PA-C.    Allergies:   Patient has no known allergies.   Social History   Socioeconomic History   Marital status: Married    Spouse name: Not on file   Number of children: Not on file   Years of education: Not on file   Highest education level: Not on file  Occupational History   Not on file  Tobacco Use   Smoking status: Former    Packs/day: 0.50    Years: 20.00    Pack years: 10.00    Types: Cigarettes    Quit date: 08/07/2008    Years since quitting: 12.8   Smokeless tobacco: Never  Vaping Use   Vaping Use:  Never used  Substance and Sexual Activity   Alcohol use: No   Drug use: No   Sexual activity: Not on file  Other Topics Concern   Not on file  Social History Narrative   Not on file   Social Determinants of Health   Financial Resource Strain: Not on file  Food Insecurity: Not on file  Transportation Needs: Not on file  Physical Activity: Not on file  Stress: Not on file  Social Connections: Not on file     Family History:  The patient's family history includes Arrhythmia in his sister; Hyperlipidemia in his brother.  ROS:   ROS   EKGs/Labs/Other Studies Reviewed:    Studies reviewed were summarized above. The additional studies were reviewed today:  2D echo 12/2013: - Left ventricle: The cavity size was normal. Wall thickness    was normal. Systolic function was mildly to moderately    reduced. The estimated ejection fraction was in the range    of 40% to 45%. Wall motion was normal; there were no    regional wall motion abnormalities. The study is not    technically sufficient to allow evaluation of LV diastolic    function.  - Aorta: Aortic root dimension: 21mm (ED).  - Aortic root: The aortic root was mildly dilated.  - Mitral valve: Mild regurgitation.  - Left atrium: The atrium was mildly dilated.  - Right ventricle: The cavity size was mildly dilated. Wall    thickness was normal.  - Right atrium: The atrium was mildly dilated.  - Atrial septum: There was a small secundum atrial septal    defect. There was a small left-to-right shunt through an    atrial septal defect.  - Pulmonary arteries: Systolic pressure was within the    normal range.    EKG:  EKG is ordered today.  The EKG ordered today demonstrates ***  Recent Labs: 01/25/2021: ALT 18; BUN 12; Creatinine, Ser 1.07; Hemoglobin 15.9; Platelets 277; Potassium 4.2; Sodium 141; TSH 6.900  Recent Lipid Panel    Component Value Date/Time   CHOL 183 01/25/2021 0915   TRIG 124 01/25/2021 0915   HDL 52  01/25/2021 0915   CHOLHDL 3.2 12/12/2017 0926   LDLCALC 109 (H) 01/25/2021 0915    PHYSICAL EXAM:    VS:  There were no vitals taken for this visit.  BMI: There is no height or weight on file to calculate BMI.  Physical Exam  Wt Readings from Last 3 Encounters:  01/24/21 174 lb (78.9 kg)  10/20/20 172 lb (78 kg)  04/13/20 172 lb 4 oz (78.1 kg)     ASSESSMENT & PLAN:   Permanent A. fib:  HFrEF secondary to NICM:  Small PFO:  HLD: LDL 108 from 11/2019 with normal LFT at that time.  Goal LDL less than 70.  OSA:  Disposition: F/u with Dr. Kirke Corin or an APP in ***.   Medication Adjustments/Labs and Tests Ordered: Current medicines are reviewed at length with the patient today.  Concerns regarding medicines are outlined above. Medication changes, Labs and Tests ordered today are summarized above and listed in the Patient Instructions accessible in Encounters.   Signed, Eula Listen, PA-C 06/22/2021 1:20 PM     [facility] [place] [address] [place], [place] [address] [phone]

## 2021-06-26 DIAGNOSIS — F332 Major depressive disorder, recurrent severe without psychotic features: Secondary | ICD-10-CM | POA: Diagnosis not present

## 2021-06-26 DIAGNOSIS — F410 Panic disorder [episodic paroxysmal anxiety] without agoraphobia: Secondary | ICD-10-CM | POA: Diagnosis not present

## 2021-06-26 DIAGNOSIS — J301 Allergic rhinitis due to pollen: Secondary | ICD-10-CM | POA: Diagnosis not present

## 2021-06-28 ENCOUNTER — Ambulatory Visit: Payer: Medicare Other | Admitting: Physician Assistant

## 2021-06-29 ENCOUNTER — Encounter: Payer: Self-pay | Admitting: Physician Assistant

## 2021-07-03 DIAGNOSIS — J301 Allergic rhinitis due to pollen: Secondary | ICD-10-CM | POA: Diagnosis not present

## 2021-07-10 DIAGNOSIS — J301 Allergic rhinitis due to pollen: Secondary | ICD-10-CM | POA: Diagnosis not present

## 2021-07-17 DIAGNOSIS — J301 Allergic rhinitis due to pollen: Secondary | ICD-10-CM | POA: Diagnosis not present

## 2021-07-27 ENCOUNTER — Ambulatory Visit: Payer: Medicare Other | Admitting: Nurse Practitioner

## 2021-07-31 DIAGNOSIS — J301 Allergic rhinitis due to pollen: Secondary | ICD-10-CM | POA: Diagnosis not present

## 2021-08-07 ENCOUNTER — Encounter: Payer: Self-pay | Admitting: Nurse Practitioner

## 2021-08-07 ENCOUNTER — Other Ambulatory Visit: Payer: Self-pay

## 2021-08-07 ENCOUNTER — Ambulatory Visit (INDEPENDENT_AMBULATORY_CARE_PROVIDER_SITE_OTHER): Payer: Medicare Other | Admitting: Nurse Practitioner

## 2021-08-07 VITALS — BP 128/86 | HR 111 | Temp 98.3°F | Resp 16 | Ht 73.0 in | Wt 172.0 lb

## 2021-08-07 DIAGNOSIS — J329 Chronic sinusitis, unspecified: Secondary | ICD-10-CM

## 2021-08-07 DIAGNOSIS — Z1212 Encounter for screening for malignant neoplasm of rectum: Secondary | ICD-10-CM

## 2021-08-07 DIAGNOSIS — R0981 Nasal congestion: Secondary | ICD-10-CM

## 2021-08-07 DIAGNOSIS — E039 Hypothyroidism, unspecified: Secondary | ICD-10-CM

## 2021-08-07 DIAGNOSIS — I4821 Permanent atrial fibrillation: Secondary | ICD-10-CM

## 2021-08-07 DIAGNOSIS — J301 Allergic rhinitis due to pollen: Secondary | ICD-10-CM | POA: Diagnosis not present

## 2021-08-07 DIAGNOSIS — Z1211 Encounter for screening for malignant neoplasm of colon: Secondary | ICD-10-CM | POA: Diagnosis not present

## 2021-08-07 DIAGNOSIS — G2581 Restless legs syndrome: Secondary | ICD-10-CM

## 2021-08-07 MED ORDER — LEVOTHYROXINE SODIUM 88 MCG PO TABS: 88.0000 ug | ORAL_TABLET | Freq: Every day | ORAL | 3 refills | Status: DC

## 2021-08-07 MED ORDER — ROPINIROLE HCL 0.5 MG PO TABS: 0.5000 mg | ORAL_TABLET | ORAL | 3 refills | Status: DC | PRN

## 2021-08-07 MED ORDER — ALBUTEROL SULFATE HFA 108 (90 BASE) MCG/ACT IN AERS: 2.0000 | INHALATION_SPRAY | Freq: Four times a day (QID) | RESPIRATORY_TRACT | 5 refills | Status: DC | PRN

## 2021-08-07 MED ORDER — FLUTICASONE PROPIONATE 50 MCG/ACT NA SUSP: NASAL | 1 refills | Status: DC

## 2021-08-07 NOTE — Progress Notes (Signed)
[facility] [address] [place], [place] [address]  Internal MEDICINE  Office Visit Note  Patient Name: Jeffery Shaw  [id number]  [id number]  Date of Service: 08/07/2021  Chief Complaint  Patient presents with   Follow-up    Discuss meds   Gastroesophageal Reflux   Sleep Apnea   Hyperlipidemia   Anxiety   Asthma   COPD   Quality Metric Gaps    Colonoscopy, shingrix done pt will get dates, boosters done records will be sent    HPI Jeffery Shaw presents for a follow up visit for medication review and routine follow up. Jeffery Shaw has a history of atrial fibrillation with long term use of anticoagulant, chronic systolic heart failure, hypothyroidism, restless leg syndrome, hyperlipidemia, GERD, sleep apnea, COPD, asthma, and anxiety. Jeffery Shaw also has bilateral cataractsand a mildly dilated aortic root identified in 2015. Jeffery Shaw will be having dental surgery at the end of September and takes eliquis. Jeffery Shaw is requesting instruction on when to stop and restart the medication.   Jeffery Shaw has his annual physical exam in march this year. Jeffery Shaw is due for routine screening colonoscopy. Jeffery Shaw has had 2 doses of the shingles vaccine and the first 2 doses of the covid vaccine. Jeffery Shaw states that Jeffery Shaw has had a couple of covid booster doses but will have to find the dates and call the clinic with the information. Jeffery Shaw declined the flu vaccine.  Jeffery Shaw is requesting medication refills and denies any pain and has no other questions or concerns today.   Current Medication: Outpatient Encounter Medications as of 08/07/2021  Medication Sig   albuterol (VENTOLIN HFA) 108 (90 Base) MCG/ACT inhaler Inhale 2 puffs into the lungs every 6 (six) hours as needed for wheezing or shortness of breath.   apixaban (ELIQUIS) 5 MG TABS tablet Take 1 tablet (5 mg total) by mouth 2 (two) times daily.   buPROPion (WELLBUTRIN SR) 100 MG 12 hr tablet Take 100 mg by mouth daily.   clonazePAM (KLONOPIN) 0.5 MG tablet Take 2.5 mg by mouth 2 (two) times daily  as needed for anxiety.   furosemide (LASIX) 20 MG tablet TAKE 1 TABLET BY MOUTH EVERY DAY AS NEEDED   sertraline (ZOLOFT) 100 MG tablet Take 100 mg by mouth daily. As per pt her taking .75   VITAMIN D PO Take by mouth daily.   [DISCONTINUED] digoxin (LANOXIN) 0.125 MG tablet Take 1 tablet (0.125 mg total) by mouth daily.   [DISCONTINUED] fluticasone (FLONASE) 50 MCG/ACT nasal spray SHAKE LIQUID AND USE 2 SPRAYS IN EACH NOSTRIL TWICE DAILY   [DISCONTINUED] levothyroxine (SYNTHROID) 88 MCG tablet Take 1 tablet (88 mcg total) by mouth daily.   [DISCONTINUED] losartan (COZAAR) 25 MG tablet TAKE 1 TABLET(25 MG) BY MOUTH DAILY   [DISCONTINUED] metoprolol tartrate (LOPRESSOR) 100 MG tablet TAKE 1 TABLET(100 MG) BY MOUTH TWICE DAILY   [DISCONTINUED] rOPINIRole (REQUIP) 0.5 MG tablet Take 1 tablet (0.5 mg total) by mouth as needed.   fluticasone (FLONASE) 50 MCG/ACT nasal spray SHAKE LIQUID AND USE 2 SPRAYS IN EACH NOSTRIL TWICE DAILY   levothyroxine (SYNTHROID) 88 MCG tablet Take 1 tablet (88 mcg total) by mouth daily.   rOPINIRole (REQUIP) 0.5 MG tablet Take 1 tablet (0.5 mg total) by mouth as needed.   [DISCONTINUED] pneumococcal 23 valent vaccine (PNEUMOVAX 23) 25 MCG/0.5ML injection Inject 0.12ml IM once (Patient not taking: Reported on 01/24/2021)   No facility-administered encounter medications on file as of 08/07/2021.    Surgical History: Past Surgical History:  Procedure Laterality Date   CARDIAC CATHETERIZATION  07/2012   [facility]   CARDIOVERSION  09/15/12   CATARACT EXTRACTION W/PHACO Left 04/17/2016   Procedure: CATARACT EXTRACTION PHACO AND INTRAOCULAR LENS PLACEMENT (IOC);  Surgeon: Galen Manila, MD;  Location: [facility] ORS;  Service: Ophthalmology;  Laterality: Left;  [place] 32.9 AP% 20.1 CDE 6.59 FLUID PACK LOT # [id number]   CATARACT EXTRACTION W/PHACO Right 05/21/2016   Procedure: CATARACT EXTRACTION PHACO AND INTRAOCULAR LENS PLACEMENT (IOC);  Surgeon: Galen Manila, MD;  Location: [facility]  ORS;  Service: Ophthalmology;  Laterality: Right;  [place] 31.0 AP% 20.4 CDE 6.30 Fluid Pck Lot # [id number]   CYSTOSCOPY W/ URETEROSCOPY     TEE WITHOUT CARDIOVERSION      Medical History: Past Medical History:  Diagnosis Date   Asthma    Cataract    both eyes   Chronic atrial fibrillation (HCC)    a. Rate-controlled; b. CHA2DS2VASc = 2--> Eliquis.   Chronic systolic heart failure (HCC)    a. 07/2012 TEE: EF 30-35%; b. 07/2012 Cath: no significant coronary artery disease with an ejection fraction of 35%. Normal filling pressures with only mild PAH; c. 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 Nl PASP.   Emphysema, unspecified (HCC)    GERD (gastroesophageal reflux disease)    Hyperlipidemia    Hypothyroidism    Mildly dilated aortic root (HCC)    a. 12/2013 Echo: Ao root 45mm.   NICM (nonischemic cardiomyopathy) (HCC)    a. 07/2012 TEE: EF 30-35%; b. 07/2012 Cath: nonobs dzs, EF 35%; c. 12/2013 Echo: EF 40-45%.   Palpitations    Pancreatitis 1980   due to ETOH   Panic attacks    PFO (patent foramen ovale)    a. 2013 TEE & Cath: Tunnelled PFO with a small left-to-right atrial shunt noted on TEE with a QP/QS ratio of 1.37 by cardiac cath; b. 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   Sleep apnea    CPAP    Family History: Family History  Problem Relation Age of Onset   Arrhythmia Sister        A-fib/stroke    Hyperlipidemia Brother     Social History   Socioeconomic History   Marital status: Married    Spouse name: Not on file   Number of children: Not on file   Years of education: Not on file   Highest education level: Not on file  Occupational History   Not on file  Tobacco Use   Smoking status: Former    Packs/day: 0.50    Years: 20.00    Pack years: 10.00    Types: Cigarettes    Quit date: 08/07/2008    Years since quitting: 13.0   Smokeless tobacco: Never  Vaping Use   Vaping Use:  Never used  Substance and Sexual Activity   Alcohol use: No   Drug use: No   Sexual activity: Not on file  Other Topics Concern   Not on file  Social History Narrative   Not on file   Social Determinants of Health   Financial Resource Strain: Not on file  Food Insecurity: Not on file  Transportation Needs: Not on file  Physical Activity: Not on file  Stress: Not on file  Social Connections: Not on file  Intimate Partner Violence: Not on file      Review of Systems  Constitutional:  Negative for chills,  fatigue and unexpected weight change.  HENT:  Negative for congestion, rhinorrhea, sneezing and sore throat.   Eyes:  Negative for redness.  Respiratory:  Negative for cough, chest tightness and shortness of breath.   Cardiovascular:  Negative for chest pain and palpitations.  Gastrointestinal:  Negative for abdominal pain, constipation, diarrhea, nausea and vomiting.  Genitourinary:  Negative for dysuria and frequency.  Musculoskeletal:  Negative for arthralgias, back pain, joint swelling and neck pain.  Skin:  Negative for rash.  Neurological: Negative.  Negative for tremors and numbness.  Hematological:  Negative for adenopathy. Does not bruise/bleed easily.  Psychiatric/Behavioral:  Negative for behavioral problems (Depression), sleep disturbance and suicidal ideas. The patient is not nervous/anxious.    Vital Signs: BP 128/86   Pulse (!) 111   Temp 98.3 F (36.8 C)   Resp 16   Ht 6\' 1"  (1.854 m)   Wt 172 lb (78 kg)   SpO2 99%   BMI 22.69 kg/m    Physical Exam Vitals reviewed.  Constitutional:      General: Jeffery Shaw is not in acute distress.    Appearance: Normal appearance. Jeffery Shaw is normal weight. Jeffery Shaw is not ill-appearing.  HENT:     Head: Normocephalic and atraumatic.  Eyes:     Extraocular Movements: Extraocular movements intact.     Pupils: Pupils are equal, round, and reactive to light.  Cardiovascular:     Rate and Rhythm: Normal rate and regular rhythm.   Pulmonary:     Effort: Pulmonary effort is normal. No respiratory distress.  Neurological:     Mental Status: Jeffery Shaw is alert and oriented to person, place, and time.     Cranial Nerves: No cranial nerve deficit.     Coordination: Coordination normal.     Gait: Gait normal.  Psychiatric:        Mood and Affect: Mood normal.        Behavior: Behavior normal.       Assessment/Plan: 1. Permanent atrial fibrillation (HCC) Patient was instructed to stop eliquis the day before his dental surgery and restarting taking eliquis the morning after his surgery. So the total days missed will be 2 days.  2. Acquired hypothyroidism Refill ordered. Lab ordered to reassess levels. Last TSH was 6.9 in march this year. Repeat TSH and free T4 - levothyroxine (SYNTHROID) 88 MCG tablet; Take 1 tablet (88 mcg total) by mouth daily.  Dispense: 90 tablet; Refill: 3 -TSH and free T4  3. Restless leg syndrome Refills ordered.  - rOPINIRole (REQUIP) 0.5 MG tablet; Take 1 tablet (0.5 mg total) by mouth as needed.  Dispense: 30 tablet; Refill: 3  4. Screening for colorectal cancer Refer to GI four routine screening colonoscopy/  - Ambulatory referral to Gastroenterology  5. Recurrent sinusitis Refills ordered.  - fluticasone (FLONASE) 50 MCG/ACT nasal spray; SHAKE LIQUID AND USE 2 SPRAYS IN EACH NOSTRIL TWICE DAILY  Dispense: 96 g; Refill: 1 - albuterol (VENTOLIN HFA) 108 (90 Base) MCG/ACT inhaler; Inhale 2 puffs into the lungs every 6 (six) hours as needed for wheezing or shortness of breath.  Dispense: 8 g; Refill: 5   General Counseling: Jeffery Shaw verbalizes understanding of the findings of todays visit and agrees with plan of treatment. I have discussed any further diagnostic evaluation that may be needed or ordered today. We also reviewed his medications today. Jeffery Shaw has been encouraged to call the office with any questions or concerns that should arise related to todays visit.    Orders Placed This  Encounter  Procedures   Ambulatory referral to Gastroenterology    Meds ordered this encounter  Medications   levothyroxine (SYNTHROID) 88 MCG tablet    Sig: Take 1 tablet (88 mcg total) by mouth daily.    Dispense:  90 tablet    Refill:  3   rOPINIRole (REQUIP) 0.5 MG tablet    Sig: Take 1 tablet (0.5 mg total) by mouth as needed.    Dispense:  30 tablet    Refill:  3   fluticasone (FLONASE) 50 MCG/ACT nasal spray    Sig: SHAKE LIQUID AND USE 2 SPRAYS IN EACH NOSTRIL TWICE DAILY    Dispense:  96 g    Refill:  1    **Patient requests 90 days supply**   albuterol (VENTOLIN HFA) 108 (90 Base) MCG/ACT inhaler    Sig: Inhale 2 puffs into the lungs every 6 (six) hours as needed for wheezing or shortness of breath.    Dispense:  8 g    Refill:  5    Return in about 6 months (around 02/04/2022) for CPE, Jeffery Shaw PCP.   Total time spent:30 Minutes Time spent includes review of chart, medications, test results, and follow up plan with the patient.   [place] Controlled Substance Database was reviewed by me.  This patient was seen by Sallyanne Kuster, FNP-C in collaboration with Dr. Beverely Risen as a part of collaborative care agreement.   Darionna Banke R. Tedd Sias, MSN, FNP-C Internal medicine

## 2021-08-09 ENCOUNTER — Telehealth: Payer: Self-pay

## 2021-08-09 NOTE — Telephone Encounter (Signed)
PATIENT TO CALL us BACK HE WAS ON HIS WAY TO WORK

## 2021-08-12 ENCOUNTER — Other Ambulatory Visit: Payer: Self-pay | Admitting: Cardiovascular Disease

## 2021-08-14 ENCOUNTER — Other Ambulatory Visit: Payer: Self-pay

## 2021-08-14 DIAGNOSIS — J301 Allergic rhinitis due to pollen: Secondary | ICD-10-CM | POA: Diagnosis not present

## 2021-08-14 DIAGNOSIS — F332 Major depressive disorder, recurrent severe without psychotic features: Secondary | ICD-10-CM | POA: Diagnosis not present

## 2021-08-14 DIAGNOSIS — F410 Panic disorder [episodic paroxysmal anxiety] without agoraphobia: Secondary | ICD-10-CM | POA: Diagnosis not present

## 2021-08-14 NOTE — Telephone Encounter (Signed)
Please reschedule overdue F/U appointment. Patient has had multiple cancellations/no-shows. Patient must be seen for refills. Thank you!

## 2021-08-14 NOTE — Telephone Encounter (Signed)
*  STAT* If patient is at the pharmacy, call can be transferred to refill team.   1. Which medications need to be refilled? (please list name of each medication and dose if known) Losartan/Digoxin  2. Which pharmacy/location (including street and city if local pharmacy) is medication to be sent to? [facility]. Do they need a 30 day or 90 day supply? 90

## 2021-08-19 DIAGNOSIS — Z23 Encounter for immunization: Secondary | ICD-10-CM | POA: Diagnosis not present

## 2021-08-21 DIAGNOSIS — J301 Allergic rhinitis due to pollen: Secondary | ICD-10-CM | POA: Diagnosis not present

## 2021-08-25 NOTE — Progress Notes (Deleted)
Cardiology Office Note    Date:  08/25/2021   ID:  Jeffery Shaw, Jeffery Shaw 1946/09/18, MRN [id number]  PCP:  Jeffery Code, MD  Cardiologist:  Jeffery Bears, MD  Electrophysiologist:  None   Chief Complaint: Follow-up  History of Present Illness:   Jeffery Shaw is a 75 y.o. 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    TEE in 2013, showed an EF of 30-35% with normal atrial size along with a tunneled PFO with small to moderate left atrial shunting by color Doppler. A R/LHC in 07/2012 showed no significant CAD with a QP/QS shunt ratio of 1.37. His most recent echo from 12/2013 showed an EF of 40-45% as detailed below. He has been maintained with rate control strategy given prior unsuccessful  DCCV, even on amiodarone.  He was last seen in the office in 10/2020 and was doing reasonably well from a cardiac perspective.  He was under increased stress, going through a separation with his wife.  Echo was recommended for later that month, though remains uncompleted.  ***   Labs independently reviewed: 01/2021 - TC 183, TG 124, HDL 52, LDL 109, TSH 6.9, free T4 normal, BUN 12, serum creatinine 1.07, potassium 4.2, albumin 4.0, AST/ALT normal, Hgb 15.9, PLT 277 03/2020 - digoxin 0.6  Past Medical History:  Diagnosis Date   Asthma    Cataract    both eyes   Chronic atrial fibrillation (HCC)    a. Rate-controlled; b. CHA2DS2VASc = 2--> Eliquis.   Chronic systolic heart failure (HCC)    a. 07/2012 TEE: EF 30-35%; b. 07/2012 Cath: no significant coronary artery disease with an ejection fraction of 35%. Normal filling pressures with only mild PAH; c. 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 Nl PASP.   Emphysema, unspecified (HCC)    GERD (gastroesophageal reflux  disease)    Hyperlipidemia    Hypothyroidism    Mildly dilated aortic root (HCC)    a. 12/2013 Echo: Ao root 40mm.   NICM (nonischemic cardiomyopathy) (HCC)    a. 07/2012 TEE: EF 30-35%; b. 07/2012 Cath: nonobs dzs, EF 35%; c. 12/2013 Echo: EF 40-45%.   Palpitations    Pancreatitis 1980   due to ETOH   Panic attacks    PFO (patent foramen ovale)    a. 2013 TEE & Cath: Tunnelled PFO with a small left-to-right atrial shunt noted on TEE with a QP/QS ratio of 1.37 by cardiac cath; b. 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   Sleep apnea    CPAP    Past Surgical History:  Procedure Laterality Date   CARDIAC CATHETERIZATION  07/2012   [facility]   CARDIOVERSION  09/15/12   CATARACT EXTRACTION W/PHACO Left 04/17/2016   Procedure: CATARACT EXTRACTION PHACO AND INTRAOCULAR LENS PLACEMENT (IOC);  Surgeon: Galen Manila, MD;  Location: [facility] ORS;  Service: Ophthalmology;  Laterality: Left;  [place] 32.9 AP% 20.1 CDE 6.59 FLUID PACK LOT # [id number]   CATARACT EXTRACTION W/PHACO Right 05/21/2016   Procedure: CATARACT EXTRACTION PHACO AND INTRAOCULAR LENS PLACEMENT (IOC);  Surgeon: Galen Manila, MD;  Location: [facility] ORS;  Service: Ophthalmology;  Laterality: Right;  [place] 31.0 AP% 20.4 CDE 6.30 Fluid Pck Lot # [id number] H   CYSTOSCOPY W/ URETEROSCOPY  TEE WITHOUT CARDIOVERSION      Current Medications: No outpatient medications have been marked as taking for the 08/28/21 encounter (Appointment) with Sondra Barges, PA-C.    Allergies:   Patient has no known allergies.   Social History   Socioeconomic History   Marital status: Married    Spouse name: Not on file   Number of children: Not on file   Years of education: Not on file   Highest education level: Not on file  Occupational History   Not on file  Tobacco Use   Smoking status: Former    Packs/day: 0.50    Years: 20.00    Pack years: 10.00    Types: Cigarettes    Quit date: 08/07/2008    Years since quitting: 13.0    Smokeless tobacco: Never  Vaping Use   Vaping Use: Never used  Substance and Sexual Activity   Alcohol use: No   Drug use: No   Sexual activity: Not on file  Other Topics Concern   Not on file  Social History Narrative   Not on file   Social Determinants of Health   Financial Resource Strain: Not on file  Food Insecurity: Not on file  Transportation Needs: Not on file  Physical Activity: Not on file  Stress: Not on file  Social Connections: Not on file     Family History:  The patient's family history includes Arrhythmia in his sister; Hyperlipidemia in his brother.  ROS:   ROS   EKGs/Labs/Other Studies Reviewed:    Studies reviewed were summarized above. The additional studies were reviewed today:  2D echo 12/2013: - Left ventricle: The cavity size was normal. Wall thickness    was normal. Systolic function was mildly to moderately    reduced. The estimated ejection fraction was in the range    of 40% to 45%. Wall motion was normal; there were no    regional wall motion abnormalities. The study is not    technically sufficient to allow evaluation of LV diastolic    function.  - Aorta: Aortic root dimension: 63mm (ED).  - Aortic root: The aortic root was mildly dilated.  - Mitral valve: Mild regurgitation.  - Left atrium: The atrium was mildly dilated.  - Right ventricle: The cavity size was mildly dilated. Wall    thickness was normal.  - Right atrium: The atrium was mildly dilated.  - Atrial septum: There was a small secundum atrial septal    defect. There was a small left-to-right shunt through an    atrial septal defect.  - Pulmonary arteries: Systolic pressure was within the    normal range.     EKG:  EKG is ordered today.  The EKG ordered today demonstrates ***  Recent Labs: 01/25/2021: ALT 18; BUN 12; Creatinine, Ser 1.07; Hemoglobin 15.9; Platelets 277; Potassium 4.2; Sodium 141; TSH 6.900  Recent Lipid Panel    Component Value Date/Time   CHOL 183  01/25/2021 0915   TRIG 124 01/25/2021 0915   HDL 52 01/25/2021 0915   CHOLHDL 3.2 12/12/2017 0926   LDLCALC 109 (H) 01/25/2021 0915    PHYSICAL EXAM:    VS:  There were no vitals taken for this visit.  BMI: There is no height or weight on file to calculate BMI.  Physical Exam  Wt Readings from Last 3 Encounters:  08/07/21 172 lb (78 kg)  01/24/21 174 lb (78.9 kg)  10/20/20 172 lb (78 kg)     ASSESSMENT &  PLAN:   Permanent A. fib: ***.  CHADS2VASc 2 (CHF, age x 1).   HFrEF:  Small PFO:   HLD: LDL 109 in 01/2021.  Disposition: F/u with Dr. Kirke Corin or an APP in ***.   Medication Adjustments/Labs and Tests Ordered: Current medicines are reviewed at length with the patient today.  Concerns regarding medicines are outlined above. Medication changes, Labs and Tests ordered today are summarized above and listed in the Patient Instructions accessible in Encounters.   Signed, Eula Listen, PA-C 08/25/2021 11:18 AM     [facility] [address] [place], [place] [address] [phone]

## 2021-08-28 ENCOUNTER — Ambulatory Visit: Payer: Medicare Other | Admitting: Physician Assistant

## 2021-08-28 DIAGNOSIS — J301 Allergic rhinitis due to pollen: Secondary | ICD-10-CM | POA: Diagnosis not present

## 2021-09-04 DIAGNOSIS — J301 Allergic rhinitis due to pollen: Secondary | ICD-10-CM | POA: Diagnosis not present

## 2021-09-05 DIAGNOSIS — F332 Major depressive disorder, recurrent severe without psychotic features: Secondary | ICD-10-CM | POA: Diagnosis not present

## 2021-09-05 DIAGNOSIS — F410 Panic disorder [episodic paroxysmal anxiety] without agoraphobia: Secondary | ICD-10-CM | POA: Diagnosis not present

## 2021-09-07 DIAGNOSIS — Z23 Encounter for immunization: Secondary | ICD-10-CM | POA: Diagnosis not present

## 2021-09-08 DIAGNOSIS — J301 Allergic rhinitis due to pollen: Secondary | ICD-10-CM | POA: Diagnosis not present

## 2021-09-11 DIAGNOSIS — J301 Allergic rhinitis due to pollen: Secondary | ICD-10-CM | POA: Diagnosis not present

## 2021-09-12 ENCOUNTER — Other Ambulatory Visit: Payer: Self-pay | Admitting: Cardiovascular Disease

## 2021-09-12 NOTE — Telephone Encounter (Signed)
Patient needs an appt for further refills, thanks !

## 2021-09-13 NOTE — Telephone Encounter (Signed)
*  STAT* If patient is at the pharmacy, call can be transferred to refill team.   1. Which medications need to be refilled? (please list name of each medication and dose if known)    Digoxin and losartan   2. Which pharmacy/location (including street and city if local pharmacy) is medication to be sent to? [facility]   3. Do they need a 30 day or 90 day supply? 90  Appt scheduled ryan 11/15

## 2021-09-14 ENCOUNTER — Other Ambulatory Visit: Payer: Self-pay

## 2021-09-14 MED ORDER — DIGOXIN 125 MCG PO TABS: ORAL_TABLET | ORAL | 2 refills | Status: DC

## 2021-09-14 NOTE — Telephone Encounter (Signed)
*  STAT* If patient is at the pharmacy, call can be transferred to refill team.   1. Which medications need to be refilled? (please list name of each medication and dose if known) Digoxin  2. Which pharmacy/location (including street and city if local pharmacy) is medication to be sent to? [facility]  3. Do they need a 30 day or 90 day supply? 90

## 2021-09-18 DIAGNOSIS — J301 Allergic rhinitis due to pollen: Secondary | ICD-10-CM | POA: Diagnosis not present

## 2021-09-25 DIAGNOSIS — J301 Allergic rhinitis due to pollen: Secondary | ICD-10-CM | POA: Diagnosis not present

## 2021-10-02 DIAGNOSIS — J301 Allergic rhinitis due to pollen: Secondary | ICD-10-CM | POA: Diagnosis not present

## 2021-10-02 DIAGNOSIS — F410 Panic disorder [episodic paroxysmal anxiety] without agoraphobia: Secondary | ICD-10-CM | POA: Diagnosis not present

## 2021-10-02 DIAGNOSIS — F332 Major depressive disorder, recurrent severe without psychotic features: Secondary | ICD-10-CM | POA: Diagnosis not present

## 2021-10-02 NOTE — Progress Notes (Deleted)
Cardiology Office Note    Date:  10/02/2021   ID:  Jeffery Shaw, DOB [birth year], MRN [id number]  PCP:  Lyndon Code, MD  Cardiologist:  Lorine Bears, MD  Electrophysiologist:  None   Chief Complaint: Follow-up  History of Present Illness:   Jeffery Shaw is a 75 y.o. 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    TEE in 2013, showed an EF of 30-35% with normal atrial size along with a tunneled PFO with small to moderate left atrial shunting by color Doppler.  R/LHC in 07/2012 showed no significant CAD with a QP/QS shunt ratio of 1.37. His most recent echo from 12/2013 showed an EF of 40-45% as detailed below. He has been maintained with rate control strategy given prior unsuccessful  DCCV, even on amiodarone.  He was last seen in the office in 10/2020 and was doing reasonably well.  He was under significant stress at home, going through a separation with his wife.  Follow-up echo was recommended.  ***   Labs independently reviewed: 01/2021 - TC 183, TG 124, HDL 52, LDL 109, TSH 6.9, free T4 normal, BUN 12, serum creatinine 1.07, potassium 4.2, albumin 4.0, AST/ALT normal, Hgb 15.9, PLT 277 03/2020 - digoxin 0.6  Past Medical History:  Diagnosis Date   Asthma    Cataract    both eyes   Chronic atrial fibrillation (HCC)    a. Rate-controlled; b. CHA2DS2VASc = 2--> Eliquis.   Chronic systolic heart failure (HCC)    a. 07/2012 TEE: EF 30-35%; b. 07/2012 Cath: no significant coronary artery disease with an ejection fraction of 35%. Normal filling pressures with only mild PAH; c. 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 Nl PASP.   Emphysema, unspecified (HCC)    GERD (gastroesophageal reflux disease)    Hyperlipidemia    Hypothyroidism    Mildly  dilated aortic root (HCC)    a. 12/2013 Echo: Ao root 46mm.   NICM (nonischemic cardiomyopathy) (HCC)    a. 07/2012 TEE: EF 30-35%; b. 07/2012 Cath: nonobs dzs, EF 35%; c. 12/2013 Echo: EF 40-45%.   Palpitations    Pancreatitis 1980   due to ETOH   Panic attacks    PFO (patent foramen ovale)    a. 2013 TEE & Cath: Tunnelled PFO with a small left-to-right atrial shunt noted on TEE with a QP/QS ratio of 1.37 by cardiac cath; b. 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   Sleep apnea    CPAP    Past Surgical History:  Procedure Laterality Date   CARDIAC CATHETERIZATION  07/2012   [facility]   CARDIOVERSION  09/15/12   CATARACT EXTRACTION W/PHACO Left 04/17/2016   Procedure: CATARACT EXTRACTION PHACO AND INTRAOCULAR LENS PLACEMENT (IOC);  Surgeon: Galen Manila, MD;  Location: [facility] ORS;  Service: Ophthalmology;  Laterality: Left;  [place] 32.9 AP% 20.1 CDE 6.59 FLUID PACK LOT # [id number]   CATARACT EXTRACTION W/PHACO Right 05/21/2016   Procedure: CATARACT EXTRACTION PHACO AND INTRAOCULAR LENS PLACEMENT (IOC);  Surgeon: Galen Manila, MD;  Location: [facility] ORS;  Service: Ophthalmology;  Laterality: Right;  [place] 31.0 AP% 20.4 CDE 6.30 Fluid Pck Lot # [id number]   CYSTOSCOPY W/ URETEROSCOPY     TEE WITHOUT CARDIOVERSION  Current Medications: No outpatient medications have been marked as taking for the 10/03/21 encounter (Appointment) with Sondra Barges, PA-C.    Allergies:   Patient has no known allergies.   Social History   Socioeconomic History   Marital status: Married    Spouse name: Not on file   Number of children: Not on file   Years of education: Not on file   Highest education level: Not on file  Occupational History   Not on file  Tobacco Use   Smoking status: Former    Packs/day: 0.50    Years: 20.00    Pack years: 10.00    Types: Cigarettes    Quit date: 08/07/2008    Years since quitting: 13.1   Smokeless tobacco: Never  Vaping Use   Vaping Use: Never used   Substance and Sexual Activity   Alcohol use: No   Drug use: No   Sexual activity: Not on file  Other Topics Concern   Not on file  Social History Narrative   Not on file   Social Determinants of Health   Financial Resource Strain: Not on file  Food Insecurity: Not on file  Transportation Needs: Not on file  Physical Activity: Not on file  Stress: Not on file  Social Connections: Not on file     Family History:  The patient's family history includes Arrhythmia in his sister; Hyperlipidemia in his brother.  ROS:   ROS   EKGs/Labs/Other Studies Reviewed:    Studies reviewed were summarized above. The additional studies were reviewed today:  2D echo 12/2013: - Left ventricle: The cavity size was normal. Wall thickness    was normal. Systolic function was mildly to moderately    reduced. The estimated ejection fraction was in the range    of 40% to 45%. Wall motion was normal; there were no    regional wall motion abnormalities. The study is not    technically sufficient to allow evaluation of LV diastolic    function.  - Aorta: Aortic root dimension: 35mm (ED).  - Aortic root: The aortic root was mildly dilated.  - Mitral valve: Mild regurgitation.  - Left atrium: The atrium was mildly dilated.  - Right ventricle: The cavity size was mildly dilated. Wall    thickness was normal.  - Right atrium: The atrium was mildly dilated.  - Atrial septum: There was a small secundum atrial septal    defect. There was a small left-to-right shunt through an    atrial septal defect.  - Pulmonary arteries: Systolic pressure was within the    normal range.    EKG:  EKG is ordered today.  The EKG ordered today demonstrates ***  Recent Labs: 01/25/2021: ALT 18; BUN 12; Creatinine, Ser 1.07; Hemoglobin 15.9; Platelets 277; Potassium 4.2; Sodium 141; TSH 6.900  Recent Lipid Panel    Component Value Date/Time   CHOL 183 01/25/2021 0915   TRIG 124 01/25/2021 0915   HDL 52 01/25/2021  0915   CHOLHDL 3.2 12/12/2017 0926   LDLCALC 109 (H) 01/25/2021 0915    PHYSICAL EXAM:    VS:  There were no vitals taken for this visit.  BMI: There is no height or weight on file to calculate BMI.  Physical Exam  Wt Readings from Last 3 Encounters:  08/07/21 172 lb (78 kg)  01/24/21 174 lb (78.9 kg)  10/20/20 172 lb (78 kg)     ASSESSMENT & PLAN:   Permanent A. fib:  HFrEF secondary  to NICM:  PFO:  HLD: LDL 109 in 01/2021.   {Are you ordering a CV Procedure (e.g. stress test, cath, DCCV, TEE, etc)?   Press F2        :[id number]}     Disposition: F/u with Dr. Kirke Corin or an APP in ***.   Medication Adjustments/Labs and Tests Ordered: Current medicines are reviewed at length with the patient today.  Concerns regarding medicines are outlined above. Medication changes, Labs and Tests ordered today are summarized above and listed in the Patient Instructions accessible in Encounters.   Signed, Eula Listen, PA-C 10/02/2021 9:42 AM     [facility] [address] [place], [place] [address] [phone]

## 2021-10-03 ENCOUNTER — Ambulatory Visit: Payer: Medicare Other | Admitting: Physician Assistant

## 2021-10-09 DIAGNOSIS — J301 Allergic rhinitis due to pollen: Secondary | ICD-10-CM | POA: Diagnosis not present

## 2021-10-16 DIAGNOSIS — J301 Allergic rhinitis due to pollen: Secondary | ICD-10-CM | POA: Diagnosis not present

## 2021-10-19 ENCOUNTER — Other Ambulatory Visit: Payer: Self-pay | Admitting: Cardiovascular Disease

## 2021-10-23 DIAGNOSIS — J301 Allergic rhinitis due to pollen: Secondary | ICD-10-CM | POA: Diagnosis not present

## 2021-10-23 NOTE — Progress Notes (Deleted)
Cardiology Office Note    Date:  10/23/2021   ID:  Davius, Goudeau 1945-12-18, MRN [id number]  PCP:  Lyndon Code, MD  Cardiologist:  Lorine Bears, MD  Electrophysiologist:  None   Chief Complaint: Follow up  History of Present Illness:   JSHAWN HURTA is a 75 y.o. 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    TEE in 2013, showed an EF of 30-35% with normal atrial size along with a tunneled PFO with small to moderate left atrial shunting by color Doppler.  R/LHC in 07/2012 showed no significant CAD with a QP/QS shunt ratio of 1.37. His most recent echo from 12/2013 showed an EF of 40-45% as detailed below. He has been maintained with rate control strategy given prior unsuccessful  DCCV, even on amiodarone.  He was last seen in the office in 10/2020 and was doing reasonably well.  He was under significant stress at home, going through a separation with his wife.  Follow-up echo was recommended.  ***   Labs independently reviewed: 01/2021 - TC 183, TG 124, HDL 52, LDL 109, TSH 6.9, free T4 normal, BUN 12, serum creatinine 1.07, potassium 4.2, albumin 4.0, AST/ALT normal, Hgb 15.9, PLT 277 03/2020 - digoxin 0.6    Past Medical History:  Diagnosis Date   Asthma    Cataract    both eyes   Chronic atrial fibrillation (HCC)    a. Rate-controlled; b. CHA2DS2VASc = 2--> Eliquis.   Chronic systolic heart failure (HCC)    a. 07/2012 TEE: EF 30-35%; b. 07/2012 Cath: no significant coronary artery disease with an ejection fraction of 35%. Normal filling pressures with only mild PAH; c. 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 Nl PASP.   Emphysema, unspecified (HCC)    GERD (gastroesophageal reflux disease)    Hyperlipidemia    Hypothyroidism     Mildly dilated aortic root (HCC)    a. 12/2013 Echo: Ao root 26mm.   NICM (nonischemic cardiomyopathy) (HCC)    a. 07/2012 TEE: EF 30-35%; b. 07/2012 Cath: nonobs dzs, EF 35%; c. 12/2013 Echo: EF 40-45%.   Palpitations    Pancreatitis 1980   due to ETOH   Panic attacks    PFO (patent foramen ovale)    a. 2013 TEE & Cath: Tunnelled PFO with a small left-to-right atrial shunt noted on TEE with a QP/QS ratio of 1.37 by cardiac cath; b. 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   Sleep apnea    CPAP    Past Surgical History:  Procedure Laterality Date   CARDIAC CATHETERIZATION  07/2012   [facility]   CARDIOVERSION  09/15/12   CATARACT EXTRACTION W/PHACO Left 04/17/2016   Procedure: CATARACT EXTRACTION PHACO AND INTRAOCULAR LENS PLACEMENT (IOC);  Surgeon: Galen Manila, MD;  Location: [facility] ORS;  Service: Ophthalmology;  Laterality: Left;  [place] 32.9 AP% 20.1 CDE 6.59 FLUID PACK LOT # [id number] H   CATARACT EXTRACTION W/PHACO Right 05/21/2016   Procedure: CATARACT EXTRACTION PHACO AND INTRAOCULAR LENS PLACEMENT (IOC);  Surgeon: Galen Manila, MD;  Location: [facility] ORS;  Service: Ophthalmology;  Laterality: Right;  [place] 31.0 AP% 20.4 CDE 6.30 Fluid Pck Lot # [id number] H   CYSTOSCOPY W/ URETEROSCOPY     TEE WITHOUT  CARDIOVERSION      Current Medications: No outpatient medications have been marked as taking for the 10/27/21 encounter (Appointment) with Sondra Barges, PA-C.    Allergies:   Patient has no known allergies.   Social History   Socioeconomic History   Marital status: Married    Spouse name: Not on file   Number of children: Not on file   Years of education: Not on file   Highest education level: Not on file  Occupational History   Not on file  Tobacco Use   Smoking status: Former    Packs/day: 0.50    Years: 20.00    Pack years: 10.00    Types: Cigarettes    Quit date: 08/07/2008    Years since quitting: 13.2   Smokeless tobacco: Never  Vaping Use   Vaping Use: Never  used  Substance and Sexual Activity   Alcohol use: No   Drug use: No   Sexual activity: Not on file  Other Topics Concern   Not on file  Social History Narrative   Not on file   Social Determinants of Health   Financial Resource Strain: Not on file  Food Insecurity: Not on file  Transportation Needs: Not on file  Physical Activity: Not on file  Stress: Not on file  Social Connections: Not on file     Family History:  The patient's family history includes Arrhythmia in his sister; Hyperlipidemia in his brother.  ROS:   ROS   EKGs/Labs/Other Studies Reviewed:    Studies reviewed were summarized above. The additional studies were reviewed today:  2D echo 12/2013: - Left ventricle: The cavity size was normal. Wall thickness    was normal. Systolic function was mildly to moderately    reduced. The estimated ejection fraction was in the range    of 40% to 45%. Wall motion was normal; there were no    regional wall motion abnormalities. The study is not    technically sufficient to allow evaluation of LV diastolic    function.  - Aorta: Aortic root dimension: 47mm (ED).  - Aortic root: The aortic root was mildly dilated.  - Mitral valve: Mild regurgitation.  - Left atrium: The atrium was mildly dilated.  - Right ventricle: The cavity size was mildly dilated. Wall    thickness was normal.  - Right atrium: The atrium was mildly dilated.  - Atrial septum: There was a small secundum atrial septal    defect. There was a small left-to-right shunt through an    atrial septal defect.  - Pulmonary arteries: Systolic pressure was within the    normal range.     EKG:  EKG is ordered today.  The EKG ordered today demonstrates ***  Recent Labs: 01/25/2021: ALT 18; BUN 12; Creatinine, Ser 1.07; Hemoglobin 15.9; Platelets 277; Potassium 4.2; Sodium 141; TSH 6.900  Recent Lipid Panel    Component Value Date/Time   CHOL 183 01/25/2021 0915   TRIG 124 01/25/2021 0915   HDL 52  01/25/2021 0915   CHOLHDL 3.2 12/12/2017 0926   LDLCALC 109 (H) 01/25/2021 0915    PHYSICAL EXAM:    VS:  There were no vitals taken for this visit.  BMI: There is no height or weight on file to calculate BMI.  Physical Exam  Wt Readings from Last 3 Encounters:  08/07/21 172 lb (78 kg)  01/24/21 174 lb (78.9 kg)  10/20/20 172 lb (78 kg)     ASSESSMENT & PLAN:  Permanent A. fib:  HFrEF secondary to NICM:  PFO:  HLD: LDL 109 in 01/2021.   {Are you ordering a CV Procedure (e.g. stress test, cath, DCCV, TEE, etc)?   Press F2        :[id number]}     Disposition: F/u with Dr. Kirke Corin or an APP in ***.   Medication Adjustments/Labs and Tests Ordered: Current medicines are reviewed at length with the patient today.  Concerns regarding medicines are outlined above. Medication changes, Labs and Tests ordered today are summarized above and listed in the Patient Instructions accessible in Encounters.   Signed, Eula Listen, PA-C 10/23/2021 4:13 PM     [facility] [address] [place], [place] [address] [phone]

## 2021-10-27 ENCOUNTER — Ambulatory Visit: Payer: Medicare Other | Admitting: Physician Assistant

## 2021-10-30 DIAGNOSIS — J301 Allergic rhinitis due to pollen: Secondary | ICD-10-CM | POA: Diagnosis not present

## 2021-11-03 DIAGNOSIS — F410 Panic disorder [episodic paroxysmal anxiety] without agoraphobia: Secondary | ICD-10-CM | POA: Diagnosis not present

## 2021-11-03 DIAGNOSIS — F332 Major depressive disorder, recurrent severe without psychotic features: Secondary | ICD-10-CM | POA: Diagnosis not present

## 2021-11-06 DIAGNOSIS — J301 Allergic rhinitis due to pollen: Secondary | ICD-10-CM | POA: Diagnosis not present

## 2021-11-25 ENCOUNTER — Other Ambulatory Visit: Payer: Self-pay | Admitting: Nurse Practitioner

## 2021-11-25 DIAGNOSIS — G2581 Restless legs syndrome: Secondary | ICD-10-CM

## 2021-11-27 DIAGNOSIS — J301 Allergic rhinitis due to pollen: Secondary | ICD-10-CM | POA: Diagnosis not present

## 2021-11-29 NOTE — Progress Notes (Deleted)
Cardiology Office Note    Date:  11/29/2021   ID:  Jeffery Shaw, DOB 05-26-1946, MRN [id number]  PCP:  Lavera Guise, MD  Cardiologist:  Kathlyn Sacramento, MD  Electrophysiologist:  None   Chief Complaint: Follow up  History of Present Illness:   Jeffery Shaw is a 76 y.o. male with history of permanent A. fib on Eliquis with prior unsuccessful DCCV, HFrEF secondary to NICM, small PFO, HLD, hypothyroidism, OSA on CPAP, and GERD who presents for follow-up of his A. fib and cardiomyopathy.  TEE in 2013, showed an EF of 30-35% with normal atrial size along with a tunneled PFO with small to moderate left atrial shunting by color Doppler. A R/LHC in 07/2012 showed no significant CAD with a QP/QS shunt ratio of 1.37. His most recent echo from 12/2013 showed an EF of 40-45% as detailed below. He has been maintained with rate control strategy given prior unsuccessful  DCCV, even on amiodarone.  He was last seen in the office in 10/2020 and was doing reasonably well from a cardiac perspective.  He was under increased stress at home, going through a separation from his wife.  Follow-up echo remains pending.  ***    Labs independently reviewed: 01/2021 - TC 183, TG 124, HDL 52, LDL 109, TSH 6.9, free T4 normal, BUN 12, serum creatinine 1.07, potassium 4.2, albumin 4.0, AST/ALT normal, Hgb 15.9, PLT 277 03/2020 - digoxin 0.6  Past Medical History:  Diagnosis Date   Asthma    Cataract    both eyes   Chronic atrial fibrillation ([place])    a. Rate-controlled; b. CHA2DS2VASc = 2--> Eliquis.   Chronic systolic heart failure ([place])    a. 07/2012 TEE: EF 30-35%; b. 07/2012 Cath: no significant coronary artery disease with an ejection fraction of 35%. Normal filling pressures with only mild PAH; c. 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 Nl PASP.   Emphysema, unspecified ([place])    GERD (gastroesophageal reflux disease)    Hyperlipidemia     Hypothyroidism    Mildly dilated aortic root ([place])    a. 12/2013 Echo: Ao root 29mm.   NICM (nonischemic cardiomyopathy) ([place])    a. 07/2012 TEE: EF 30-35%; b. 07/2012 Cath: nonobs dzs, EF 35%; c. 12/2013 Echo: EF 40-45%.   Palpitations    Pancreatitis 1980   due to ETOH   Panic attacks    PFO (patent foramen ovale)    a. 2013 TEE & Cath: Tunnelled PFO with a small left-to-right atrial shunt noted on TEE with a QP/QS ratio of 1.37 by cardiac cath; b. 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   Sleep apnea    CPAP    Past Surgical History:  Procedure Laterality Date   CARDIAC CATHETERIZATION  07/2012   [facility]   CARDIOVERSION  09/15/12   CATARACT EXTRACTION W/PHACO Left 04/17/2016   Procedure: CATARACT EXTRACTION PHACO AND INTRAOCULAR LENS PLACEMENT ([place]);  Surgeon: Birder Robson, MD;  Location: [facility] ORS;  Service: Ophthalmology;  Laterality: Left;  [place] 32.9 AP% 20.1 CDE 6.59 FLUID PACK LOT # [id number]   CATARACT EXTRACTION W/PHACO Right 05/21/2016   Procedure: CATARACT EXTRACTION PHACO AND INTRAOCULAR LENS PLACEMENT (IOC);  Surgeon: Birder Robson, MD;  Location: [facility]S;  Service: Ophthalmology;  Laterality: Right;  [place] 31.0 AP% 20.4 CDE 6.30 Fluid Pck Lot # [id number]   CYSTOSCOPY W/ URETEROSCOPY  TEE WITHOUT CARDIOVERSION      Current Medications: No outpatient medications have been marked as taking for the 12/01/21 encounter (Appointment) with Rise Mu, PA-C.    Allergies:   Patient has no known allergies.   Social History   Socioeconomic History   Marital status: Married    Spouse name: Not on file   Number of children: Not on file   Years of education: Not on file   Highest education level: Not on file  Occupational History   Not on file  Tobacco Use   Smoking status: Former    Packs/day: 0.50    Years: 20.00    Pack years: 10.00    Types: Cigarettes    Quit date: 08/07/2008    Years since quitting: 13.3   Smokeless tobacco: Never  Vaping Use    Vaping Use: Never used  Substance and Sexual Activity   Alcohol use: No   Drug use: No   Sexual activity: Not on file  Other Topics Concern   Not on file  Social History Narrative   Not on file   Social Determinants of Health   Financial Resource Strain: Not on file  Food Insecurity: Not on file  Transportation Needs: Not on file  Physical Activity: Not on file  Stress: Not on file  Social Connections: Not on file     Family History:  The patient's family history includes Arrhythmia in his sister; Hyperlipidemia in his brother.  ROS:   ROS   EKGs/Labs/Other Studies Reviewed:    Studies reviewed were summarized above. The additional studies were reviewed today:  2D echo 12/2013: - Left ventricle: The cavity size was normal. Wall thickness    was normal. Systolic function was mildly to moderately    reduced. The estimated ejection fraction was in the range    of 40% to 45%. Wall motion was normal; there were no    regional wall motion abnormalities. The study is not    technically sufficient to allow evaluation of LV diastolic    function.  - Aorta: Aortic root dimension: 19mm (ED).  - Aortic root: The aortic root was mildly dilated.  - Mitral valve: Mild regurgitation.  - Left atrium: The atrium was mildly dilated.  - Right ventricle: The cavity size was mildly dilated. Wall    thickness was normal.  - Right atrium: The atrium was mildly dilated.  - Atrial septum: There was a small secundum atrial septal    defect. There was a small left-to-right shunt through an    atrial septal defect.  - Pulmonary arteries: Systolic pressure was within the    normal range.     EKG:  EKG is ordered today.  The EKG ordered today demonstrates ***  Recent Labs: 01/25/2021: ALT 18; BUN 12; Creatinine, Ser 1.07; Hemoglobin 15.9; Platelets 277; Potassium 4.2; Sodium 141; TSH 6.900  Recent Lipid Panel    Component Value Date/Time   CHOL 183 01/25/2021 0915   TRIG 124 01/25/2021  0915   HDL 52 01/25/2021 0915   CHOLHDL 3.2 12/12/2017 0926   LDLCALC 109 (H) 01/25/2021 0915    PHYSICAL EXAM:    VS:  There were no vitals taken for this visit.  BMI: There is no height or weight on file to calculate BMI.  Physical Exam  Wt Readings from Last 3 Encounters:  08/07/21 172 lb (78 kg)  01/24/21 174 lb (78.9 kg)  10/20/20 172 lb (78 kg)     ASSESSMENT &  PLAN:   Permanent A. fib:  HFrEF secondary to NICM:  Small PFO:  HLD: LDL ***  OSA:   {Are you ordering a CV Procedure (e.g. stress test, cath, DCCV, TEE, etc)?   Press F2        :[id number]     Disposition: F/u with Dr. Fletcher Anon or an APP in ***.   Medication Adjustments/Labs and Tests Ordered: Current medicines are reviewed at length with the patient today.  Concerns regarding medicines are outlined above. Medication changes, Labs and Tests ordered today are summarized above and listed in the Patient Instructions accessible in Encounters.   Signed, Christell Faith, PA-C 11/29/2021 1:43 PM     [place], [place] [address] [phone]

## 2021-12-01 ENCOUNTER — Ambulatory Visit: Payer: Medicare Other | Admitting: Physician Assistant

## 2021-12-04 ENCOUNTER — Encounter: Payer: Self-pay | Admitting: Physician Assistant

## 2021-12-04 DIAGNOSIS — J301 Allergic rhinitis due to pollen: Secondary | ICD-10-CM | POA: Diagnosis not present

## 2021-12-06 DIAGNOSIS — F332 Major depressive disorder, recurrent severe without psychotic features: Secondary | ICD-10-CM | POA: Diagnosis not present

## 2021-12-06 DIAGNOSIS — F410 Panic disorder [episodic paroxysmal anxiety] without agoraphobia: Secondary | ICD-10-CM | POA: Diagnosis not present

## 2021-12-07 DIAGNOSIS — F1011 Alcohol abuse, in remission: Secondary | ICD-10-CM | POA: Diagnosis not present

## 2021-12-07 DIAGNOSIS — F410 Panic disorder [episodic paroxysmal anxiety] without agoraphobia: Secondary | ICD-10-CM | POA: Diagnosis not present

## 2021-12-07 DIAGNOSIS — F39 Unspecified mood [affective] disorder: Secondary | ICD-10-CM | POA: Diagnosis not present

## 2021-12-07 DIAGNOSIS — F411 Generalized anxiety disorder: Secondary | ICD-10-CM | POA: Diagnosis not present

## 2021-12-08 ENCOUNTER — Telehealth: Payer: Self-pay

## 2021-12-08 DIAGNOSIS — J301 Allergic rhinitis due to pollen: Secondary | ICD-10-CM | POA: Diagnosis not present

## 2021-12-08 NOTE — Telephone Encounter (Signed)
Completed medical records for [facility] Dr. Caryn Section fax [phone]

## 2021-12-10 ENCOUNTER — Other Ambulatory Visit: Payer: Self-pay | Admitting: Cardiovascular Disease

## 2021-12-11 ENCOUNTER — Telehealth: Payer: Self-pay | Admitting: Cardiovascular Disease

## 2021-12-11 DIAGNOSIS — J301 Allergic rhinitis due to pollen: Secondary | ICD-10-CM | POA: Diagnosis not present

## 2021-12-11 MED ORDER — DIGOXIN 125 MCG PO TABS: ORAL_TABLET | ORAL | 0 refills | Status: DC

## 2021-12-11 NOTE — Telephone Encounter (Signed)
°*  STAT* If patient is at the pharmacy, call can be transferred to refill team.   1. Which medications need to be refilled? (please list name of each medication and dose if known) losartan 25 MG 1 tablet daily  Digoxin 0.125 MG 1 tablet daily   2. Which pharmacy/location (including street and city if local pharmacy) is medication to be sent to? [facility] in [place]   3. Do they need a 30 day or 90 day supply? 90 day

## 2021-12-11 NOTE — Telephone Encounter (Signed)
15 day supply was sent to patient's pharmacy on file per request. Only #15 was sent to patient not being seen since 2021 and frequent no shows. Patient has schedule an appointment with provider for 12/15/2021

## 2021-12-14 DIAGNOSIS — T50995A Adverse effect of other drugs, medicaments and biological substances, initial encounter: Secondary | ICD-10-CM | POA: Diagnosis not present

## 2021-12-15 ENCOUNTER — Other Ambulatory Visit: Payer: Self-pay

## 2021-12-15 ENCOUNTER — Encounter: Payer: Self-pay | Admitting: Cardiovascular Disease

## 2021-12-15 ENCOUNTER — Other Ambulatory Visit
Admission: RE | Admit: 2021-12-15 | Discharge: 2021-12-15 | Disposition: A | Discharge status: Home / Self Care | Payer: Medicare Other | Attending: Cardiovascular Disease | Admitting: Cardiovascular Disease

## 2021-12-15 ENCOUNTER — Ambulatory Visit (INDEPENDENT_AMBULATORY_CARE_PROVIDER_SITE_OTHER): Payer: Medicare Other | Admitting: Cardiovascular Disease

## 2021-12-15 VITALS — BP 120/80 | HR 81 | Ht 72.0 in | Wt 177.2 lb

## 2021-12-15 DIAGNOSIS — I4821 Permanent atrial fibrillation: Secondary | ICD-10-CM

## 2021-12-15 DIAGNOSIS — I5022 Chronic systolic (congestive) heart failure: Secondary | ICD-10-CM

## 2021-12-15 DIAGNOSIS — E785 Hyperlipidemia, unspecified: Secondary | ICD-10-CM | POA: Diagnosis not present

## 2021-12-15 DIAGNOSIS — Q2112 Patent foramen ovale: Secondary | ICD-10-CM | POA: Diagnosis not present

## 2021-12-15 LAB — CBC WITH DIFFERENTIAL/PLATELET
Abs Immature Granulocytes: 0.04 10*3/uL (ref 0.00–0.07)
Basophils Absolute: 0.1 10*3/uL (ref 0.0–0.1)
Basophils Relative: 1 %
Eosinophils Absolute: 0.1 10*3/uL (ref 0.0–0.5)
Eosinophils Relative: 1 %
HCT: 46.5 % (ref 39.0–52.0)
Hemoglobin: 16.3 g/dL (ref 13.0–17.0)
Immature Granulocytes: 0 %
Lymphocytes Relative: 21 %
Lymphs Abs: 2 10*3/uL (ref 0.7–4.0)
MCH: 33.1 pg (ref 26.0–34.0)
MCHC: 35.1 g/dL (ref 30.0–36.0)
MCV: 94.5 fL (ref 80.0–100.0)
Monocytes Absolute: 1 10*3/uL (ref 0.1–1.0)
Monocytes Relative: 10 %
Neutro Abs: 6.6 10*3/uL (ref 1.7–7.7)
Neutrophils Relative %: 67 %
Platelets: 260 10*3/uL (ref 150–400)
RBC: 4.92 MIL/uL (ref 4.22–5.81)
RDW: 12.8 % (ref 11.5–15.5)
WBC: 9.8 10*3/uL (ref 4.0–10.5)
nRBC: 0 % (ref 0.0–0.2)

## 2021-12-15 LAB — BASIC METABOLIC PANEL
Anion gap: 7 (ref 5–15)
BUN: 17 mg/dL (ref 8–23)
CO2: 31 mmol/L (ref 22–32)
Calcium: 8.6 mg/dL — ABNORMAL LOW (ref 8.9–10.3)
Chloride: 99 mmol/L (ref 98–111)
Creatinine, Ser: 1.18 mg/dL (ref 0.61–1.24)
GFR, Estimated: >60 mL/min (ref >60)
Glucose, Bld: 119 mg/dL — ABNORMAL HIGH (ref 70–99)
Potassium: 3.8 mmol/L (ref 3.5–5.1)
Sodium: 137 mmol/L (ref 135–145)

## 2021-12-15 LAB — DIGOXIN LEVEL: Digoxin Level: 0.8 ng/mL (ref 0.8–2.0)

## 2021-12-15 MED ORDER — LOSARTAN POTASSIUM 25 MG PO TABS: 25.0000 mg | ORAL_TABLET | Freq: Every day | ORAL | 2 refills | Status: DC

## 2021-12-15 MED ORDER — DIGOXIN 125 MCG PO TABS: ORAL_TABLET | ORAL | 2 refills | Status: DC

## 2021-12-15 MED ORDER — FUROSEMIDE 20 MG PO TABS: 20.0000 mg | ORAL_TABLET | Freq: Every day | ORAL | 2 refills | Status: AC | PRN

## 2021-12-15 MED ORDER — METOPROLOL TARTRATE 100 MG PO TABS: ORAL_TABLET | ORAL | 2 refills | Status: DC

## 2021-12-15 MED ORDER — APIXABAN 5 MG PO TABS: 5.0000 mg | ORAL_TABLET | Freq: Two times a day (BID) | ORAL | 0 refills | Status: DC

## 2021-12-15 NOTE — Patient Instructions (Signed)
Medication Instructions:  Your physician recommends that you continue on your current medications as directed. Please refer to the Current Medication list given to you today.  All of your cardiac medications have been refilled today.  Make sure to take Eliquis 5 mg 1 tablet twice a day  *If you need a refill on your cardiac medications before your next appointment, please call your pharmacy*   Lab Work: Cbc, Cmp, Digoxin level today. Please have your labs drawn at the [facility]. Stop at the Registration desk to check in.  If you have labs (blood work) drawn today and your tests are completely normal, you will receive your results only by: [place] (if you have MyChart) OR A paper copy in the mail If you have any lab test that is abnormal or we need to change your treatment, we will call you to review the results.   Testing/Procedures: Your physician has requested that you have an echocardiogram. Echocardiography is a painless test that uses sound waves to create images of your heart. It provides your doctor with information about the size and shape of your heart and how well your hearts chambers and valves are working. This procedure takes approximately one hour. There are no restrictions for this procedure.    Follow-Up: At [facility], you and your health needs are our priority.  As part of our continuing mission to provide you with exceptional heart care, we have created designated Provider Care Teams.  These Care Teams include your primary Cardiologist (physician) and Advanced Practice Providers (APPs -  Physician Assistants and Nurse Practitioners) who all work together to provide you with the care you need, when you need it.  We recommend signing up for the patient portal called "MyChart".  Sign up information is provided on this After Visit Summary.  MyChart is used to connect with patients for Virtual Visits (Telemedicine).  Patients are able to view lab/test results,  encounter notes, upcoming appointments, etc.  Non-urgent messages can be sent to your provider as well.   To learn more about what you can do with MyChart, go to [email or web address].    Your next appointment:   Your physician wants you to follow-up in: 1 year You will receive a reminder letter in the mail two months in advance. If you don't receive a letter, please call our office to schedule the follow-up appointment.   The format for your next appointment:   In Person  Provider:   You may see Kathlyn Sacramento, MD or one of the following Advanced Practice Providers on your designated Care Team:   Murray Hodgkins, NP Christell Faith, PA-C Cadence Kathlen Mody, PA-C{     Other Instructions N/A

## 2021-12-15 NOTE — Progress Notes (Signed)
Cardiology Office Note   Date:  12/15/2021   ID:  Jeffery Shaw, Jeffery Shaw 02/10/1946, MRN [id number]  PCP:  Lyndon Code, MD  Cardiologist:   Lorine Bears, MD   Chief Complaint  Patient presents with   Other    OD f/u no complaints today. Meds reviewed verbally with pt.      History of Present Illness: Jeffery Shaw is a 76 y.o. male who presents for a followup visit  regarding chronic systolic heart failure, chronic atrial fibrillation and a small PFO.  TEE in 2013 showed an ejection fraction of 30-35% with normal atrial size. There was a tunneled PFO with a small to moderate left atrial shunting by color Doppler. A right and left cardiac catheterization in 07/2012  showed no significant coronary artery disease with a QP/QS shunt ratio of 1.37.  Most recent echocardiogram in February 2015 showed an ejection fraction of 40-45%. He has sleep apnea on CPAP. He did not maintain in sinus rhythm after cardioversion even with amiodarone and thus he is being treated with rate control.  He has been doing well with no recent chest pain, shortness of breath or palpitations.  He has been taking Eliquis once daily instead of twice daily by mistake.  He is still trying to finalize his divorce and continues to be stressed about this.     Past Medical History:  Diagnosis Date   Asthma    Cataract    both eyes   Chronic atrial fibrillation (HCC)    a. Rate-controlled; b. CHA2DS2VASc = 2--> Eliquis.   Chronic systolic heart failure (HCC)    a. 07/2012 TEE: EF 30-35%; b. 07/2012 Cath: no significant coronary artery disease with an ejection fraction of 35%. Normal filling pressures with only mild PAH; c. 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 Nl PASP.   Emphysema, unspecified (HCC)    GERD (gastroesophageal reflux disease)    Hyperlipidemia    Hypothyroidism    Mildly dilated aortic root (HCC)    a. 12/2013 Echo: Ao  root 84mm.   NICM (nonischemic cardiomyopathy) (HCC)    a. 07/2012 TEE: EF 30-35%; b. 07/2012 Cath: nonobs dzs, EF 35%; c. 12/2013 Echo: EF 40-45%.   Palpitations    Pancreatitis 1980   due to ETOH   Panic attacks    PFO (patent foramen ovale)    a. 2013 TEE & Cath: Tunnelled PFO with a small left-to-right atrial shunt noted on TEE with a QP/QS ratio of 1.37 by cardiac cath; b. 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   Sleep apnea    CPAP    Past Surgical History:  Procedure Laterality Date   CARDIAC CATHETERIZATION  07/2012   [facility]   CARDIOVERSION  09/15/12   CATARACT EXTRACTION W/PHACO Left 04/17/2016   Procedure: CATARACT EXTRACTION PHACO AND INTRAOCULAR LENS PLACEMENT (IOC);  Surgeon: Galen Manila, MD;  Location: [facility] ORS;  Service: Ophthalmology;  Laterality: Left;  [place] 32.9 AP% 20.1 CDE 6.59 FLUID PACK LOT # [id number]   CATARACT EXTRACTION W/PHACO Right 05/21/2016   Procedure: CATARACT EXTRACTION PHACO AND INTRAOCULAR LENS PLACEMENT (IOC);  Surgeon: Galen Manila, MD;  Location: [facility] ORS;  Service: Ophthalmology;  Laterality: Right;  [place] 31.0 AP% 20.4 CDE 6.30 Fluid Pck Lot # [id number] H   CYSTOSCOPY W/ URETEROSCOPY     TEE WITHOUT CARDIOVERSION       Current  Outpatient Medications  Medication Sig Dispense Refill   albuterol (VENTOLIN HFA) 108 (90 Base) MCG/ACT inhaler Inhale 2 puffs into the lungs every 6 (six) hours as needed for wheezing or shortness of breath. 8 g 5   apixaban (ELIQUIS) 5 MG TABS tablet Take 1 tablet (5 mg total) by mouth 2 (two) times daily. 180 tablet 1   buPROPion (WELLBUTRIN SR) 100 MG 12 hr tablet Take 100 mg by mouth daily.     clonazePAM (KLONOPIN) 0.5 MG tablet Take 2.5 mg by mouth 2 (two) times daily as needed for anxiety.     clonazePAM (KLONOPIN) 0.5 MG tablet Take 0.5 tablets by mouth 3 (three) times daily as needed.     digoxin (LANOXIN) 0.125 MG tablet TAKE 1 TABLET(0.125 MG) BY MOUTH DAILY 15 tablet 0   fluticasone (FLONASE) 50  MCG/ACT nasal spray SHAKE LIQUID AND USE 2 SPRAYS IN EACH NOSTRIL TWICE DAILY 96 g 1   fluticasone (FLONASE) 50 MCG/ACT nasal spray Place into the nose daily as needed.     furosemide (LASIX) 20 MG tablet TAKE 1 TABLET BY MOUTH EVERY DAY AS NEEDED 90 tablet 3   levothyroxine (SYNTHROID) 88 MCG tablet Take 1 tablet (88 mcg total) by mouth daily. 90 tablet 3   losartan (COZAAR) 25 MG tablet TAKE 1 TABLET(25 MG) BY MOUTH DAILY 15 tablet 0   metoprolol tartrate (LOPRESSOR) 100 MG tablet TAKE 1 TABLET(100 MG) BY MOUTH TWICE DAILY 180 tablet 0   predniSONE (DELTASONE) 20 MG tablet Take by mouth as directed.     rOPINIRole (REQUIP) 0.5 MG tablet TAKE 1 TABLET BY MOUTH AS NEEDED 90 tablet 1   sertraline (ZOLOFT) 100 MG tablet Take 100 mg by mouth daily. As per pt her taking .75     VITAMIN D PO Take by mouth daily.     No current facility-administered medications for this visit.    Allergies:   Patient has no known allergies.    Social History:  The patient  reports that he quit smoking about 13 years ago. His smoking use included cigarettes. He has a 10.00 pack-year smoking history. He has never used smokeless tobacco. He reports that he does not drink alcohol and does not use drugs.   Family History:  The patient's family history includes Arrhythmia in his sister and sister; Hyperlipidemia in his brother.    ROS:  Please see the history of present illness.   Otherwise, review of systems are positive for none.   All other systems are reviewed and negative.    PHYSICAL EXAM: VS:  BP 120/80 (BP Location: Left Arm, Patient Position: Sitting, Cuff Size: Normal)    Pulse 81    Ht 6' (1.829 m)    Wt 177 lb 4 oz (80.4 kg)    SpO2 96%    BMI 24.04 kg/m  , BMI Body mass index is 24.04 kg/m. GEN: Well nourished, well developed, in no acute distress  HEENT: normal  Neck: no JVD, carotid bruits, or masses Cardiac: Irregularly irregular; no murmurs, rubs, or gallops,no edema  Respiratory:  clear to  auscultation bilaterally, normal work of breathing GI: soft, nontender, nondistended, + BS MS: no deformity or atrophy  Skin: warm and dry, no rash Neuro:  Strength and sensation are intact Psych: euthymic mood, full affect   EKG:  EKG is ordered today. The ekg ordered today demonstrates atrial fibrillation with ventricular rate of 81 bpm.  ST changes likely due to digoxin.  Recent Labs: 01/25/2021: ALT  18; BUN 12; Creatinine, Ser 1.07; Hemoglobin 15.9; Platelets 277; Potassium 4.2; Sodium 141; TSH 6.900    Lipid Panel    Component Value Date/Time   CHOL 183 01/25/2021 0915   TRIG 124 01/25/2021 0915   HDL 52 01/25/2021 0915   CHOLHDL 3.2 12/12/2017 0926   LDLCALC 109 (H) 01/25/2021 0915      Wt Readings from Last 3 Encounters:  12/15/21 177 lb 4 oz (80.4 kg)  08/07/21 172 lb (78 kg)  01/24/21 174 lb (78.9 kg)        ASSESSMENT AND PLAN:  1.  Chronic atrial fibrillation: Ventricular rate is well controlled on metoprolol and small dose digoxin. He is tolerating anticoagulation with no side effects.  I requested CBC, digoxin level and basic metabolic profile.   2. Chronic systolic heart failure: He has no symptoms of volume overload and currently uses furosemide as needed. Continue metoprolol and low-dose losartan.  He was supposed to get a repeat echocardiogram but that was not done.  I requested an echocardiogram.   If EF is below 40%, the plan is to switch from losartan to [facility] and change metoprolol tartrate to metoprolol succinate.  3. Small PFO: We will check with upcoming echocardiogram   4. Hyperlipidemia: Currently on simvastatin .  Most recent lipid profile showed an LDL of 108.    Disposition:   FU with me in 12 months  Signed,  Lorine Bears, MD  12/15/2021 10:27 AM    [facility]

## 2021-12-18 ENCOUNTER — Telehealth: Payer: Self-pay | Admitting: Cardiovascular Disease

## 2021-12-18 DIAGNOSIS — J301 Allergic rhinitis due to pollen: Secondary | ICD-10-CM | POA: Diagnosis not present

## 2021-12-18 NOTE — Telephone Encounter (Signed)
Patient calling to review results as he states he doesn't have access to mychart .   Advised of below patient states he still has questions .

## 2021-12-18 NOTE — Telephone Encounter (Signed)
I called and spoke with the patient. I have advised him that his BMET/ CBC/ Digoxin levels were all within the normal range per Dr. Kirke Corin.  The patient voices understanding and was very appreciative of the call back.

## 2021-12-18 NOTE — Telephone Encounter (Signed)
Result Notes  Lamar Laundry, RN  12/15/2021  1:54 PM EST Back to Top    Results released to mychart.   Wellington Hampshire, MD  12/15/2021  1:32 PM EST     Inform patient that labs were normal.

## 2021-12-21 DIAGNOSIS — T7840XA Allergy, unspecified, initial encounter: Secondary | ICD-10-CM | POA: Diagnosis not present

## 2021-12-25 DIAGNOSIS — J301 Allergic rhinitis due to pollen: Secondary | ICD-10-CM | POA: Diagnosis not present

## 2021-12-27 DIAGNOSIS — F410 Panic disorder [episodic paroxysmal anxiety] without agoraphobia: Secondary | ICD-10-CM | POA: Diagnosis not present

## 2021-12-27 DIAGNOSIS — F39 Unspecified mood [affective] disorder: Secondary | ICD-10-CM | POA: Diagnosis not present

## 2021-12-27 DIAGNOSIS — F1011 Alcohol abuse, in remission: Secondary | ICD-10-CM | POA: Diagnosis not present

## 2021-12-27 DIAGNOSIS — F411 Generalized anxiety disorder: Secondary | ICD-10-CM | POA: Diagnosis not present

## 2021-12-28 ENCOUNTER — Other Ambulatory Visit: Payer: Self-pay | Admitting: Cardiovascular Disease

## 2022-01-01 DIAGNOSIS — J301 Allergic rhinitis due to pollen: Secondary | ICD-10-CM | POA: Diagnosis not present

## 2022-01-02 DIAGNOSIS — F332 Major depressive disorder, recurrent severe without psychotic features: Secondary | ICD-10-CM | POA: Diagnosis not present

## 2022-01-02 DIAGNOSIS — F410 Panic disorder [episodic paroxysmal anxiety] without agoraphobia: Secondary | ICD-10-CM | POA: Diagnosis not present

## 2022-01-05 ENCOUNTER — Other Ambulatory Visit: Payer: Medicare Other

## 2022-01-06 DIAGNOSIS — Z20822 Contact with and (suspected) exposure to covid-19: Secondary | ICD-10-CM | POA: Diagnosis not present

## 2022-01-08 DIAGNOSIS — J301 Allergic rhinitis due to pollen: Secondary | ICD-10-CM | POA: Diagnosis not present

## 2022-01-15 DIAGNOSIS — J301 Allergic rhinitis due to pollen: Secondary | ICD-10-CM | POA: Diagnosis not present

## 2022-01-16 DIAGNOSIS — F1011 Alcohol abuse, in remission: Secondary | ICD-10-CM | POA: Diagnosis not present

## 2022-01-16 DIAGNOSIS — F410 Panic disorder [episodic paroxysmal anxiety] without agoraphobia: Secondary | ICD-10-CM | POA: Diagnosis not present

## 2022-01-16 DIAGNOSIS — F39 Unspecified mood [affective] disorder: Secondary | ICD-10-CM | POA: Diagnosis not present

## 2022-01-16 DIAGNOSIS — F411 Generalized anxiety disorder: Secondary | ICD-10-CM | POA: Diagnosis not present

## 2022-01-22 DIAGNOSIS — J301 Allergic rhinitis due to pollen: Secondary | ICD-10-CM | POA: Diagnosis not present

## 2022-01-29 ENCOUNTER — Ambulatory Visit: Payer: Medicare Other | Admitting: Nurse Practitioner

## 2022-01-29 DIAGNOSIS — Z0289 Encounter for other administrative examinations: Secondary | ICD-10-CM

## 2022-01-30 ENCOUNTER — Telehealth: Payer: Self-pay

## 2022-01-30 NOTE — Telephone Encounter (Signed)
Attempted to contact patient  in regards to missed office visit from 01-29-22. Patients voicemail is full.  ?

## 2022-01-31 ENCOUNTER — Telehealth: Payer: Self-pay

## 2022-01-31 NOTE — Telephone Encounter (Signed)
Copy of discharge letter placed in scan. ?

## 2022-01-31 NOTE — Telephone Encounter (Signed)
Patient mailed discharge letter due to non-compliancy. ?

## 2022-02-05 DIAGNOSIS — J301 Allergic rhinitis due to pollen: Secondary | ICD-10-CM | POA: Diagnosis not present

## 2022-02-07 DIAGNOSIS — F332 Major depressive disorder, recurrent severe without psychotic features: Secondary | ICD-10-CM | POA: Diagnosis not present

## 2022-02-07 DIAGNOSIS — F410 Panic disorder [episodic paroxysmal anxiety] without agoraphobia: Secondary | ICD-10-CM | POA: Diagnosis not present

## 2022-02-08 DIAGNOSIS — F39 Unspecified mood [affective] disorder: Secondary | ICD-10-CM | POA: Diagnosis not present

## 2022-02-08 DIAGNOSIS — F1011 Alcohol abuse, in remission: Secondary | ICD-10-CM | POA: Diagnosis not present

## 2022-02-08 DIAGNOSIS — F410 Panic disorder [episodic paroxysmal anxiety] without agoraphobia: Secondary | ICD-10-CM | POA: Diagnosis not present

## 2022-02-08 DIAGNOSIS — F411 Generalized anxiety disorder: Secondary | ICD-10-CM | POA: Diagnosis not present

## 2022-02-08 NOTE — Telephone Encounter (Signed)
Please see

## 2022-02-12 ENCOUNTER — Telehealth: Payer: Self-pay

## 2022-02-12 DIAGNOSIS — J301 Allergic rhinitis due to pollen: Secondary | ICD-10-CM | POA: Diagnosis not present

## 2022-02-12 NOTE — Telephone Encounter (Signed)
Spoke to pt, confirmed his next appt 03-16-22, informed him if appt was missed he would be discharged. Also informed him we sent him a [employer].  ?

## 2022-02-12 NOTE — Telephone Encounter (Signed)
Lmom to call us back 

## 2022-02-15 ENCOUNTER — Other Ambulatory Visit: Payer: Medicare Other

## 2022-02-19 DIAGNOSIS — J301 Allergic rhinitis due to pollen: Secondary | ICD-10-CM | POA: Diagnosis not present

## 2022-02-23 DIAGNOSIS — F411 Generalized anxiety disorder: Secondary | ICD-10-CM | POA: Diagnosis not present

## 2022-02-23 DIAGNOSIS — F1011 Alcohol abuse, in remission: Secondary | ICD-10-CM | POA: Diagnosis not present

## 2022-02-23 DIAGNOSIS — F410 Panic disorder [episodic paroxysmal anxiety] without agoraphobia: Secondary | ICD-10-CM | POA: Diagnosis not present

## 2022-02-23 DIAGNOSIS — F39 Unspecified mood [affective] disorder: Secondary | ICD-10-CM | POA: Diagnosis not present

## 2022-02-26 DIAGNOSIS — J301 Allergic rhinitis due to pollen: Secondary | ICD-10-CM | POA: Diagnosis not present

## 2022-02-26 DIAGNOSIS — F332 Major depressive disorder, recurrent severe without psychotic features: Secondary | ICD-10-CM | POA: Diagnosis not present

## 2022-02-26 DIAGNOSIS — F410 Panic disorder [episodic paroxysmal anxiety] without agoraphobia: Secondary | ICD-10-CM | POA: Diagnosis not present

## 2022-02-27 DIAGNOSIS — Z20822 Contact with and (suspected) exposure to covid-19: Secondary | ICD-10-CM | POA: Diagnosis not present

## 2022-03-05 DIAGNOSIS — J301 Allergic rhinitis due to pollen: Secondary | ICD-10-CM | POA: Diagnosis not present

## 2022-03-12 DIAGNOSIS — J301 Allergic rhinitis due to pollen: Secondary | ICD-10-CM | POA: Diagnosis not present

## 2022-03-16 ENCOUNTER — Ambulatory Visit: Payer: Medicare Other | Admitting: Nurse Practitioner

## 2022-03-16 DIAGNOSIS — J301 Allergic rhinitis due to pollen: Secondary | ICD-10-CM | POA: Diagnosis not present

## 2022-03-19 DIAGNOSIS — J301 Allergic rhinitis due to pollen: Secondary | ICD-10-CM | POA: Diagnosis not present

## 2022-03-26 DIAGNOSIS — J301 Allergic rhinitis due to pollen: Secondary | ICD-10-CM | POA: Diagnosis not present

## 2022-03-27 ENCOUNTER — Telehealth: Payer: Self-pay

## 2022-03-27 NOTE — Telephone Encounter (Signed)
Left vm to confirm 04/03/22 appointment-Toni ?

## 2022-03-28 ENCOUNTER — Telehealth: Payer: Self-pay

## 2022-03-28 DIAGNOSIS — E039 Hypothyroidism, unspecified: Secondary | ICD-10-CM

## 2022-03-28 MED ORDER — LEVOTHYROXINE SODIUM 88 MCG PO TABS: 88.0000 ug | ORAL_TABLET | Freq: Every day | ORAL | 0 refills | Status: DC

## 2022-03-28 NOTE — Telephone Encounter (Signed)
Lmom to call us back 

## 2022-03-29 DIAGNOSIS — Z20822 Contact with and (suspected) exposure to covid-19: Secondary | ICD-10-CM | POA: Diagnosis not present

## 2022-04-02 DIAGNOSIS — J301 Allergic rhinitis due to pollen: Secondary | ICD-10-CM | POA: Diagnosis not present

## 2022-04-03 ENCOUNTER — Encounter: Payer: Self-pay | Admitting: Nurse Practitioner

## 2022-04-03 ENCOUNTER — Ambulatory Visit (INDEPENDENT_AMBULATORY_CARE_PROVIDER_SITE_OTHER): Payer: Medicare Other | Admitting: Nurse Practitioner

## 2022-04-03 VITALS — BP 139/82 | HR 65 | Temp 98.4°F | Resp 16 | Ht 73.0 in | Wt 174.0 lb

## 2022-04-03 DIAGNOSIS — R300 Dysuria: Secondary | ICD-10-CM | POA: Diagnosis not present

## 2022-04-03 DIAGNOSIS — Z0001 Encounter for general adult medical examination with abnormal findings: Secondary | ICD-10-CM

## 2022-04-03 DIAGNOSIS — Z1211 Encounter for screening for malignant neoplasm of colon: Secondary | ICD-10-CM

## 2022-04-03 DIAGNOSIS — I4821 Permanent atrial fibrillation: Secondary | ICD-10-CM | POA: Diagnosis not present

## 2022-04-03 DIAGNOSIS — R5383 Other fatigue: Secondary | ICD-10-CM | POA: Diagnosis not present

## 2022-04-03 DIAGNOSIS — E039 Hypothyroidism, unspecified: Secondary | ICD-10-CM | POA: Diagnosis not present

## 2022-04-03 DIAGNOSIS — E538 Deficiency of other specified B group vitamins: Secondary | ICD-10-CM

## 2022-04-03 DIAGNOSIS — E559 Vitamin D deficiency, unspecified: Secondary | ICD-10-CM

## 2022-04-03 DIAGNOSIS — E782 Mixed hyperlipidemia: Secondary | ICD-10-CM

## 2022-04-03 DIAGNOSIS — Z1212 Encounter for screening for malignant neoplasm of rectum: Secondary | ICD-10-CM | POA: Diagnosis not present

## 2022-04-03 MED ORDER — LEVOTHYROXINE SODIUM 88 MCG PO TABS: 88.0000 ug | ORAL_TABLET | Freq: Every day | ORAL | 2 refills | Status: DC

## 2022-04-03 NOTE — Progress Notes (Signed)
[facility] [place],  [address]  Internal MEDICINE  Office Visit Note  Patient Name: Jeffery Shaw  [id number]  [id number]  Date of Service: 04/03/2022  Chief Complaint  Patient presents with   Medicare Wellness   Gastroesophageal Reflux   Hyperlipidemia   COPD   Anxiety   Asthma    HPI Jeffery Shaw presents for an annual well visit and physical exam.  He is a well-appearing 76 year old male with hypothyroidism, atrial fibrillation, chronic systolic heart failure, and restless leg syndrome.  He is also seeing Dr. Nicolasa Ducking who is treating his anxiety/depression and insomnia.  He states that she recently put him on Lunesta to help him with his sleep and he is currently taking Wellbutrin, oxcarbazepine, and sertraline.  He is due for routine colorectal cancer screening and is opting for seeing gastroenterology for a colonoscopy.  His last couple of PSA levels were normal.  He is due for routine labs.  He reports persistent fatigue and low energy and has not had his vitamin D or B12 levels checked to his knowledge.  Typically comes only annually for his routine physical exam and as needed throughout the year if he has any problems/concerns or illness.  He denies any new or worsening pain.  He also sees cardiology for the atrial fibrillation and heart failure.  He is currently on metoprolol, digoxin, furosemide, losartan and he takes Eliquis as his blood thinner.    Current Medication: Outpatient Encounter Medications as of 04/03/2022  Medication Sig   albuterol (VENTOLIN HFA) 108 (90 Base) MCG/ACT inhaler Inhale 2 puffs into the lungs every 6 (six) hours as needed for wheezing or shortness of breath.   apixaban (ELIQUIS) 5 MG TABS tablet Take 1 tablet (5 mg total) by mouth 2 (two) times daily.   buPROPion (WELLBUTRIN SR) 100 MG 12 hr tablet Take 100 mg by mouth daily.   clonazePAM (KLONOPIN) 0.5 MG tablet Take 0.5 tablets by mouth 3 (three) times daily as needed.    digoxin (LANOXIN) 0.125 MG tablet TAKE 1 TABLET(0.125 MG) BY MOUTH DAILY   eszopiclone (LUNESTA) 1 MG TABS tablet Take 1 mg by mouth at bedtime as needed for sleep. Take immediately before bedtime   fluticasone (FLONASE) 50 MCG/ACT nasal spray SHAKE LIQUID AND USE 2 SPRAYS IN EACH NOSTRIL TWICE DAILY   fluticasone (FLONASE) 50 MCG/ACT nasal spray Place into the nose daily as needed.   furosemide (LASIX) 20 MG tablet Take 1 tablet (20 mg total) by mouth daily as needed.   losartan (COZAAR) 25 MG tablet Take 1 tablet (25 mg total) by mouth daily.   metoprolol tartrate (LOPRESSOR) 100 MG tablet TAKE 1 TABLET(100 MG) BY MOUTH TWICE DAILY   Oxcarbazepine (TRILEPTAL) 300 MG tablet Take 300 mg by mouth daily.   predniSONE (DELTASONE) 20 MG tablet Take by mouth as directed.   rOPINIRole (REQUIP) 0.5 MG tablet TAKE 1 TABLET BY MOUTH AS NEEDED   sertraline (ZOLOFT) 100 MG tablet Take 100 mg by mouth daily. As per pt her taking .75   VITAMIN D PO Take by mouth daily.   [DISCONTINUED] clonazePAM (KLONOPIN) 0.5 MG tablet Take 2.5 mg by mouth 2 (two) times daily as needed for anxiety.   [DISCONTINUED] levothyroxine (SYNTHROID) 88 MCG tablet Take 1 tablet (88 mcg total) by mouth daily.   levothyroxine (SYNTHROID) 88 MCG tablet Take 1 tablet (88 mcg total) by mouth daily.   No facility-administered encounter medications on file as of 04/03/2022.  Surgical History: Past Surgical History:  Procedure Laterality Date   CARDIAC CATHETERIZATION  07/2012   [facility]   CARDIOVERSION  09/15/12   CATARACT EXTRACTION W/PHACO Left 04/17/2016   Procedure: CATARACT EXTRACTION PHACO AND INTRAOCULAR LENS PLACEMENT ([place]);  Surgeon: Birder Robson, MD;  Location: [facility] ORS;  Service: Ophthalmology;  Laterality: Left;  [place] 32.9 AP% 20.1 CDE 6.59 FLUID PACK LOT # [id number] H   CATARACT EXTRACTION W/PHACO Right 05/21/2016   Procedure: CATARACT EXTRACTION PHACO AND INTRAOCULAR LENS PLACEMENT (IOC);  Surgeon: Birder Robson, MD;   Location: [facility] ORS;  Service: Ophthalmology;  Laterality: Right;  [place] 31.0 AP% 20.4 CDE 6.30 Fluid Pck Lot # [id number] H   CYSTOSCOPY W/ URETEROSCOPY     TEE WITHOUT CARDIOVERSION      Medical History: Past Medical History:  Diagnosis Date   Asthma    Cataract    both eyes   Chronic atrial fibrillation ([place])    a. Rate-controlled; b. CHA2DS2VASc = 2--> Eliquis.   Chronic systolic heart failure ([place])    a. 07/2012 TEE: EF 30-35%; b. 07/2012 Cath: no significant coronary artery disease with an ejection fraction of 35%. Normal filling pressures with only mild PAH; c. 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 Nl PASP.   Emphysema, unspecified ([place])    GERD (gastroesophageal reflux disease)    Hyperlipidemia    Hypothyroidism    Mildly dilated aortic root ([place])    a. 12/2013 Echo: Ao root 61m.   NICM (nonischemic cardiomyopathy) ([place]    a. 07/2012 TEE: EF 30-35%; b. 07/2012 Cath: nonobs dzs, EF 35%; c. 12/2013 Echo: EF 40-45%.   Palpitations    Pancreatitis 1980   due to ETOH   Panic attacks    PFO (patent foramen ovale)    a. 2013 TEE & Cath: Tunnelled PFO with a small left-to-right atrial shunt noted on TEE with a QP/QS ratio of 1.37 by cardiac cath; b. 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   Sleep apnea    CPAP    Family History: Family History  Problem Relation Age of Onset   Arrhythmia Sister        A-fib/stroke    Arrhythmia Sister    Hyperlipidemia Brother     Social History   Socioeconomic History   Marital status: Married    Spouse name: Not on file   Number of children: Not on file   Years of education: Not on file   Highest education level: Not on file  Occupational History   Not on file  Tobacco Use   Smoking status: Former    Packs/day: 0.50    Years: 20.00    Pack years: 10.00    Types: Cigarettes    Quit date: 08/07/2008    Years since quitting: 13.6   Smokeless  tobacco: Never  Vaping Use   Vaping Use: Never used  Substance and Sexual Activity   Alcohol use: No   Drug use: No   Sexual activity: Not on file  Other Topics Concern   Not on file  Social History Narrative   Not on file   Social Determinants of Health   Financial Resource Strain: Not on file  Food Insecurity: Not on file  Transportation Needs: Not on file  Physical Activity: Not on file  Stress: Not on file  Social Connections: Not on file  Intimate Partner Violence: Not on file  Review of Systems  Constitutional:  Negative for activity change, appetite change, chills, fatigue, fever and unexpected weight change.  HENT: Negative.  Negative for congestion, ear pain, rhinorrhea, sore throat and trouble swallowing.   Eyes: Negative.   Respiratory: Negative.  Negative for cough, chest tightness, shortness of breath and wheezing.   Cardiovascular: Negative.  Negative for chest pain.  Gastrointestinal: Negative.  Negative for abdominal pain, blood in stool, constipation, diarrhea, nausea and vomiting.  Endocrine: Negative.   Genitourinary: Negative.  Negative for difficulty urinating, dysuria, frequency, hematuria and urgency.  Musculoskeletal: Negative.  Negative for arthralgias, back pain, joint swelling, myalgias and neck pain.  Skin: Negative.  Negative for rash and wound.  Allergic/Immunologic: Negative.  Negative for immunocompromised state.  Neurological: Negative.  Negative for dizziness, seizures, numbness and headaches.  Hematological: Negative.   Psychiatric/Behavioral: Negative.  Negative for behavioral problems, self-injury and suicidal ideas. The patient is not nervous/anxious.    Vital Signs: BP 139/82   Pulse 65   Temp 98.4 F (36.9 C)   Resp 16   Ht 6' 1"  (1.854 m)   Wt 174 lb (78.9 kg)   SpO2 99%   BMI 22.96 kg/m    Physical Exam Vitals reviewed.  Constitutional:      General: He is awake. He is not in acute distress.    Appearance: Normal  appearance. He is well-developed, well-groomed and normal weight. He is not ill-appearing or diaphoretic.  HENT:     Head: Normocephalic and atraumatic.     Right Ear: Tympanic membrane, ear canal and external ear normal.     Left Ear: Tympanic membrane, ear canal and external ear normal.     Nose: Nose normal. No congestion or rhinorrhea.     Mouth/Throat:     Lips: Pink.     Mouth: Mucous membranes are moist.     Pharynx: Oropharynx is clear. Uvula midline. No oropharyngeal exudate or posterior oropharyngeal erythema.  Eyes:     General: Lids are normal. Vision grossly intact. Gaze aligned appropriately. No scleral icterus.       Right eye: No discharge.        Left eye: No discharge.     Extraocular Movements: Extraocular movements intact.     Conjunctiva/sclera: Conjunctivae normal.     Pupils: Pupils are equal, round, and reactive to light.     Funduscopic exam:    Right eye: Red reflex present.        Left eye: Red reflex present. Neck:     Thyroid: No thyromegaly.     Vascular: No JVD.     Trachea: Trachea and phonation normal. No tracheal deviation.  Cardiovascular:     Rate and Rhythm: Normal rate. Rhythm irregular.     Pulses: Normal pulses.     Heart sounds: Murmur heard.    No friction rub. No gallop.  Pulmonary:     Effort: Pulmonary effort is normal. No accessory muscle usage or respiratory distress.     Breath sounds: Normal breath sounds and air entry. No stridor. No decreased breath sounds, wheezing or rales.  Chest:     Chest wall: No tenderness.  Abdominal:     General: Bowel sounds are normal. There is no distension.     Palpations: Abdomen is soft. There is no shifting dullness, fluid wave, mass or pulsatile mass.     Tenderness: There is no abdominal tenderness. There is no guarding or rebound.  Musculoskeletal:  General: No tenderness or deformity. Normal range of motion.     Cervical back: Normal range of motion and neck supple.     Right lower  leg: No edema.     Left lower leg: No edema.  Lymphadenopathy:     Cervical: No cervical adenopathy.  Skin:    General: Skin is warm and dry.     Capillary Refill: Capillary refill takes less than 2 seconds.     Coloration: Skin is not pale.     Findings: No erythema or rash.  Neurological:     Mental Status: He is alert and oriented to person, place, and time.     Cranial Nerves: No cranial nerve deficit.     Motor: No abnormal muscle tone.     Coordination: Coordination normal.     Deep Tendon Reflexes: Reflexes are normal and symmetric.  Psychiatric:        Mood and Affect: Mood normal.        Behavior: Behavior normal. Behavior is cooperative.        Thought Content: Thought content normal.        Judgment: Judgment normal.       Assessment/Plan: 1. Encounter for general adult medical examination with abnormal findings Age-appropriate preventive screenings and vaccinations discussed, annual physical exam completed. Routine labs for health maintenance ordered, see below. PHM updated. - CBC with Differential/Platelet - CMP14+EGFR  2. Permanent atrial fibrillation (HCC) Followed by cardiology - CBC with Differential/Platelet - CMP14+EGFR  3. Acquired hypothyroidism Levothyroxine refills ordered, routine labs ordered will reevaluate levothyroxine dose pending thyroid level results - CBC with Differential/Platelet - CMP14+EGFR - TSH + free T4 - levothyroxine (SYNTHROID) 88 MCG tablet; Take 1 tablet (88 mcg total) by mouth daily.  Dispense: 30 tablet; Refill: 2  4. Other fatigue Routine labs ordered - CBC with Differential/Platelet - CMP14+EGFR  5. B12 deficiency Routine labs ordered - CBC with Differential/Platelet - CMP14+EGFR - B12 and Folate Panel  6. Mixed hyperlipidemia Routine labs ordered - CBC with Differential/Platelet - CMP14+EGFR - Lipid Profile  7. Vitamin D deficiency Routine labs ordered - CBC with Differential/Platelet - CMP14+EGFR -  Vitamin D (25 hydroxy)  8. Dysuria Routine urinalysis done - UA/M w/rflx Culture, Routine - CBC with Differential/Platelet - CMP14+EGFR  9. Screening for colorectal cancer Referred to GI - Ambulatory referral to Gastroenterology - CBC with Differential/Platelet - CMP14+EGFR      General Counseling: eura radabaugh understanding of the findings of todays visit and agrees with plan of treatment. I have discussed any further diagnostic evaluation that may be needed or ordered today. We also reviewed his medications today. he has been encouraged to call the office with any questions or concerns that should arise related to todays visit.    Orders Placed This Encounter  Procedures   UA/M w/rflx Culture, Routine   CBC with Differential/Platelet   CMP14+EGFR   Lipid Profile   TSH + free T4   Vitamin D (25 hydroxy)   B12 and Folate Panel   Ambulatory referral to Gastroenterology    Meds ordered this encounter  Medications   levothyroxine (SYNTHROID) 88 MCG tablet    Sig: Take 1 tablet (88 mcg total) by mouth daily.    Dispense:  30 tablet    Refill:  2    Pt need appt for refills    Return in about 1 year (around 04/04/2023) for CPE, [place] PCP.   Total time spent:30 Minutes Time spent includes review of chart, medications,  test results, and follow up plan with the patient.   [place] Controlled Substance Database was reviewed by me.  This patient was seen by Jonetta Osgood, FNP-C in collaboration with Dr. Clayborn Bigness as a part of collaborative care agreement.  Minard Millirons R. Valetta Fuller, MSN, FNP-C Internal medicine

## 2022-04-04 ENCOUNTER — Telehealth: Payer: Self-pay

## 2022-04-04 DIAGNOSIS — I4821 Permanent atrial fibrillation: Secondary | ICD-10-CM | POA: Diagnosis not present

## 2022-04-04 DIAGNOSIS — E039 Hypothyroidism, unspecified: Secondary | ICD-10-CM | POA: Diagnosis not present

## 2022-04-04 DIAGNOSIS — Z0001 Encounter for general adult medical examination with abnormal findings: Secondary | ICD-10-CM | POA: Diagnosis not present

## 2022-04-04 DIAGNOSIS — R5383 Other fatigue: Secondary | ICD-10-CM | POA: Diagnosis not present

## 2022-04-04 DIAGNOSIS — Z1211 Encounter for screening for malignant neoplasm of colon: Secondary | ICD-10-CM | POA: Diagnosis not present

## 2022-04-04 DIAGNOSIS — E559 Vitamin D deficiency, unspecified: Secondary | ICD-10-CM | POA: Diagnosis not present

## 2022-04-04 DIAGNOSIS — Z1212 Encounter for screening for malignant neoplasm of rectum: Secondary | ICD-10-CM | POA: Diagnosis not present

## 2022-04-04 DIAGNOSIS — R300 Dysuria: Secondary | ICD-10-CM | POA: Diagnosis not present

## 2022-04-04 DIAGNOSIS — E782 Mixed hyperlipidemia: Secondary | ICD-10-CM | POA: Diagnosis not present

## 2022-04-04 DIAGNOSIS — E538 Deficiency of other specified B group vitamins: Secondary | ICD-10-CM | POA: Diagnosis not present

## 2022-04-04 LAB — UA/M W/RFLX CULTURE, ROUTINE
Bilirubin, UA: NEGATIVE
Glucose, UA: NEGATIVE
Ketones, UA: NEGATIVE
Leukocytes,UA: NEGATIVE
Nitrite, UA: NEGATIVE
Protein,UA: NEGATIVE
RBC, UA: NEGATIVE
Specific Gravity, UA: 1.021 (ref 1.005–1.030)
Urobilinogen, Ur: 1 mg/dL (ref 0.2–1.0)
pH, UA: 5.5 (ref 5.0–7.5)

## 2022-04-04 LAB — MICROSCOPIC EXAMINATION
Bacteria, UA: NONE SEEN
Casts: NONE SEEN /lpf

## 2022-04-04 NOTE — Telephone Encounter (Signed)
CALLED PATIENT NO ANSWER LEFT VOICEMAIL FOR A CALL BACK ? ?

## 2022-04-05 ENCOUNTER — Telehealth: Payer: Self-pay

## 2022-04-05 LAB — B12 AND FOLATE PANEL
Folate: 13.7 ng/mL (ref >3.0)
Vitamin B-12: 393 pg/mL (ref 232–1245)

## 2022-04-05 LAB — CMP14+EGFR
ALT: 24 IU/L (ref 0–44)
AST: 27 IU/L (ref 0–40)
Albumin/Globulin Ratio: 1.7 (ref 1.2–2.2)
Albumin: 4 g/dL (ref 3.7–4.7)
Alkaline Phosphatase: 58 IU/L (ref 44–121)
BUN/Creatinine Ratio: 10 (ref 10–24)
BUN: 12 mg/dL (ref 8–27)
Bilirubin Total: 1.1 mg/dL (ref 0.0–1.2)
CO2: 27 mmol/L (ref 20–29)
Calcium: 9 mg/dL (ref 8.6–10.2)
Chloride: 98 mmol/L (ref 96–106)
Creatinine, Ser: 1.18 mg/dL (ref 0.76–1.27)
Globulin, Total: 2.3 g/dL (ref 1.5–4.5)
Glucose: 103 mg/dL — ABNORMAL HIGH (ref 70–99)
Potassium: 4.2 mmol/L (ref 3.5–5.2)
Sodium: 138 mmol/L (ref 134–144)
Total Protein: 6.3 g/dL (ref 6.0–8.5)
eGFR: 65 mL/min/{1.73_m2} (ref >59)

## 2022-04-05 LAB — CBC WITH DIFFERENTIAL/PLATELET
Basophils Absolute: 0.1 10*3/uL (ref 0.0–0.2)
Basos: 1 %
EOS (ABSOLUTE): 0.2 10*3/uL (ref 0.0–0.4)
Eos: 2 %
Hematocrit: 46.4 % (ref 37.5–51.0)
Hemoglobin: 16.1 g/dL (ref 13.0–17.7)
Immature Grans (Abs): 0 10*3/uL (ref 0.0–0.1)
Immature Granulocytes: 0 %
Lymphocytes Absolute: 2.5 10*3/uL (ref 0.7–3.1)
Lymphs: 30 %
MCH: 33.1 pg — ABNORMAL HIGH (ref 26.6–33.0)
MCHC: 34.7 g/dL (ref 31.5–35.7)
MCV: 95 fL (ref 79–97)
Monocytes Absolute: 1.1 10*3/uL — ABNORMAL HIGH (ref 0.1–0.9)
Monocytes: 13 %
Neutrophils Absolute: 4.3 10*3/uL (ref 1.4–7.0)
Neutrophils: 54 %
Platelets: 240 10*3/uL (ref 150–450)
RBC: 4.87 x10E6/uL (ref 4.14–5.80)
RDW: 13.5 % (ref 11.6–15.4)
WBC: 8.1 10*3/uL (ref 3.4–10.8)

## 2022-04-05 LAB — TSH+FREE T4
Free T4: 1.77 ng/dL (ref 0.82–1.77)
TSH: 0.125 u[IU]/mL — ABNORMAL LOW (ref 0.450–4.500)

## 2022-04-05 LAB — LIPID PANEL
Chol/HDL Ratio: 4 ratio (ref 0.0–5.0)
Cholesterol, Total: 166 mg/dL (ref 100–199)
HDL: 42 mg/dL (ref >39)
LDL Chol Calc (NIH): 101 mg/dL — ABNORMAL HIGH (ref 0–99)
Triglycerides: 131 mg/dL (ref 0–149)
VLDL Cholesterol Cal: 23 mg/dL (ref 5–40)

## 2022-04-05 LAB — VITAMIN D 25 HYDROXY (VIT D DEFICIENCY, FRACTURES): Vit D, 25-Hydroxy: 23.5 ng/mL — ABNORMAL LOW (ref 30.0–100.0)

## 2022-04-05 NOTE — Telephone Encounter (Signed)
CALLED PATIENT NO ANSWER LEFT VOICEMAIL FOR A CALL BACK °Letter sent °

## 2022-04-09 DIAGNOSIS — J301 Allergic rhinitis due to pollen: Secondary | ICD-10-CM | POA: Diagnosis not present

## 2022-04-10 ENCOUNTER — Telehealth: Payer: Self-pay

## 2022-04-11 DIAGNOSIS — F39 Unspecified mood [affective] disorder: Secondary | ICD-10-CM | POA: Diagnosis not present

## 2022-04-11 DIAGNOSIS — F411 Generalized anxiety disorder: Secondary | ICD-10-CM | POA: Diagnosis not present

## 2022-04-11 DIAGNOSIS — F1011 Alcohol abuse, in remission: Secondary | ICD-10-CM | POA: Diagnosis not present

## 2022-04-11 DIAGNOSIS — F410 Panic disorder [episodic paroxysmal anxiety] without agoraphobia: Secondary | ICD-10-CM | POA: Diagnosis not present

## 2022-04-12 ENCOUNTER — Telehealth: Payer: Self-pay

## 2022-04-12 DIAGNOSIS — F332 Major depressive disorder, recurrent severe without psychotic features: Secondary | ICD-10-CM | POA: Diagnosis not present

## 2022-04-12 DIAGNOSIS — F410 Panic disorder [episodic paroxysmal anxiety] without agoraphobia: Secondary | ICD-10-CM | POA: Diagnosis not present

## 2022-04-12 NOTE — Progress Notes (Signed)
Please call patient and let him know his results: ---CBC is grossly normal, slight abnormals are seen but are not clinically significant.  ---cholesterol panel is normal except for slightly elevated LDL which improved from last year. The rest of the cholesterol levels are normal and the Chol/HDL ratio of 4.0 indicates less than average risk of developing heart disease.  --TSH is low while on levothyroxine which can be normal when treating hypothyroidism as long as symptoms are controlled and free T4 is normal. Last his TSH was high while on 75 mcg dose, I do not recommend decreasing the dose since his TSH would most likely become elevated again. No changes at this time. ---vitamin D level is slightly low. If already taking a vitamin D supplement, increase the dose. (If on 2000 units, increase to 5000 units daily.) if he is not currently on any vitamin D supplement, I recommend starting OTC vitamin D supplement of 5000 units daily. We can repeat the vitamin D level in 6 months. ---metabolic panel is normal. Kidney and liver function are normal. Glucose of 103 is normal (normal range is actually 70-100. Reference ranges are off on the labs). ---Vitamin B12 is within normal limits. He can continue or start a B12 supplement, recommendation is 1000 mcg daily.  ---folate level is normal.

## 2022-04-12 NOTE — Telephone Encounter (Signed)
-----   Message from Sallyanne Kuster, NP sent at 04/12/2022  1:03 AM EDT ----- Please call patient and let him know his results: ---CBC is grossly normal, slight abnormals are seen but are not clinically significant.  ---cholesterol panel is normal except for slightly elevated LDL which improved from last year. The rest of the cholesterol levels are normal and the Chol/HDL ratio of 4.0 indicates less than average risk of developing heart disease.  --TSH is low while on levothyroxine which can be normal when treating hypothyroidism as long as symptoms are controlled and free T4 is normal. Last his TSH was high while on 75 mcg dose, I do not recommend decreasing the dose since his TSH would most likely become elevated again. No changes at this time. ---vitamin D level is slightly low. If already taking a vitamin D supplement, increase the dose. (If on 2000 units, increase to 5000 units daily.) if he is not currently on any vitamin D supplement, I recommend starting OTC vitamin D supplement of 5000 units daily. We can repeat the vitamin D level in 6 months. ---metabolic panel is normal. Kidney and liver function are normal. Glucose of 103 is normal (normal range is actually 70-100. Reference ranges are off on the labs). ---Vitamin B12 is within normal limits. He can continue or start a B12 supplement, recommendation is 1000 mcg daily.  ---folate level is normal.

## 2022-04-13 NOTE — Telephone Encounter (Signed)
error 

## 2022-04-17 NOTE — Telephone Encounter (Signed)
Spoke to pt, provided results  

## 2022-04-23 DIAGNOSIS — J301 Allergic rhinitis due to pollen: Secondary | ICD-10-CM | POA: Diagnosis not present

## 2022-04-30 DIAGNOSIS — J301 Allergic rhinitis due to pollen: Secondary | ICD-10-CM | POA: Diagnosis not present

## 2022-05-04 DIAGNOSIS — F410 Panic disorder [episodic paroxysmal anxiety] without agoraphobia: Secondary | ICD-10-CM | POA: Diagnosis not present

## 2022-05-04 DIAGNOSIS — F39 Unspecified mood [affective] disorder: Secondary | ICD-10-CM | POA: Diagnosis not present

## 2022-05-04 DIAGNOSIS — F1011 Alcohol abuse, in remission: Secondary | ICD-10-CM | POA: Diagnosis not present

## 2022-05-04 DIAGNOSIS — F411 Generalized anxiety disorder: Secondary | ICD-10-CM | POA: Diagnosis not present

## 2022-05-07 DIAGNOSIS — J301 Allergic rhinitis due to pollen: Secondary | ICD-10-CM | POA: Diagnosis not present

## 2022-05-15 ENCOUNTER — Other Ambulatory Visit: Payer: Self-pay | Admitting: Cardiovascular Disease

## 2022-05-15 NOTE — Telephone Encounter (Signed)
Refill Request.  

## 2022-05-16 DIAGNOSIS — J301 Allergic rhinitis due to pollen: Secondary | ICD-10-CM | POA: Diagnosis not present

## 2022-05-23 DIAGNOSIS — J301 Allergic rhinitis due to pollen: Secondary | ICD-10-CM | POA: Diagnosis not present

## 2022-05-25 DIAGNOSIS — F411 Generalized anxiety disorder: Secondary | ICD-10-CM | POA: Diagnosis not present

## 2022-05-25 DIAGNOSIS — F39 Unspecified mood [affective] disorder: Secondary | ICD-10-CM | POA: Diagnosis not present

## 2022-05-25 DIAGNOSIS — F1011 Alcohol abuse, in remission: Secondary | ICD-10-CM | POA: Diagnosis not present

## 2022-05-25 DIAGNOSIS — F410 Panic disorder [episodic paroxysmal anxiety] without agoraphobia: Secondary | ICD-10-CM | POA: Diagnosis not present

## 2022-05-29 DIAGNOSIS — F332 Major depressive disorder, recurrent severe without psychotic features: Secondary | ICD-10-CM | POA: Diagnosis not present

## 2022-05-29 DIAGNOSIS — F410 Panic disorder [episodic paroxysmal anxiety] without agoraphobia: Secondary | ICD-10-CM | POA: Diagnosis not present

## 2022-05-30 DIAGNOSIS — J301 Allergic rhinitis due to pollen: Secondary | ICD-10-CM | POA: Diagnosis not present

## 2022-06-06 DIAGNOSIS — J301 Allergic rhinitis due to pollen: Secondary | ICD-10-CM | POA: Diagnosis not present

## 2022-06-08 DIAGNOSIS — J301 Allergic rhinitis due to pollen: Secondary | ICD-10-CM | POA: Diagnosis not present

## 2022-06-20 DIAGNOSIS — J301 Allergic rhinitis due to pollen: Secondary | ICD-10-CM | POA: Diagnosis not present

## 2022-06-21 DIAGNOSIS — F411 Generalized anxiety disorder: Secondary | ICD-10-CM | POA: Diagnosis not present

## 2022-06-21 DIAGNOSIS — F39 Unspecified mood [affective] disorder: Secondary | ICD-10-CM | POA: Diagnosis not present

## 2022-06-21 DIAGNOSIS — F410 Panic disorder [episodic paroxysmal anxiety] without agoraphobia: Secondary | ICD-10-CM | POA: Diagnosis not present

## 2022-06-21 DIAGNOSIS — F1011 Alcohol abuse, in remission: Secondary | ICD-10-CM | POA: Diagnosis not present

## 2022-06-27 DIAGNOSIS — J301 Allergic rhinitis due to pollen: Secondary | ICD-10-CM | POA: Diagnosis not present

## 2022-07-04 DIAGNOSIS — J301 Allergic rhinitis due to pollen: Secondary | ICD-10-CM | POA: Diagnosis not present

## 2022-07-11 DIAGNOSIS — J301 Allergic rhinitis due to pollen: Secondary | ICD-10-CM | POA: Diagnosis not present

## 2022-07-17 DIAGNOSIS — H1010 Acute atopic conjunctivitis, unspecified eye: Secondary | ICD-10-CM | POA: Diagnosis not present

## 2022-07-17 DIAGNOSIS — J309 Allergic rhinitis, unspecified: Secondary | ICD-10-CM | POA: Diagnosis not present

## 2022-07-18 DIAGNOSIS — J301 Allergic rhinitis due to pollen: Secondary | ICD-10-CM | POA: Diagnosis not present

## 2022-07-25 DIAGNOSIS — J301 Allergic rhinitis due to pollen: Secondary | ICD-10-CM | POA: Diagnosis not present

## 2022-08-01 DIAGNOSIS — J301 Allergic rhinitis due to pollen: Secondary | ICD-10-CM | POA: Diagnosis not present

## 2022-08-08 DIAGNOSIS — J301 Allergic rhinitis due to pollen: Secondary | ICD-10-CM | POA: Diagnosis not present

## 2022-08-15 DIAGNOSIS — J301 Allergic rhinitis due to pollen: Secondary | ICD-10-CM | POA: Diagnosis not present

## 2022-08-22 DIAGNOSIS — J301 Allergic rhinitis due to pollen: Secondary | ICD-10-CM | POA: Diagnosis not present

## 2022-08-22 DIAGNOSIS — F39 Unspecified mood [affective] disorder: Secondary | ICD-10-CM | POA: Diagnosis not present

## 2022-08-22 DIAGNOSIS — F410 Panic disorder [episodic paroxysmal anxiety] without agoraphobia: Secondary | ICD-10-CM | POA: Diagnosis not present

## 2022-08-22 DIAGNOSIS — F1011 Alcohol abuse, in remission: Secondary | ICD-10-CM | POA: Diagnosis not present

## 2022-08-22 DIAGNOSIS — F411 Generalized anxiety disorder: Secondary | ICD-10-CM | POA: Diagnosis not present

## 2022-08-29 DIAGNOSIS — J301 Allergic rhinitis due to pollen: Secondary | ICD-10-CM | POA: Diagnosis not present

## 2022-09-05 DIAGNOSIS — J301 Allergic rhinitis due to pollen: Secondary | ICD-10-CM | POA: Diagnosis not present

## 2022-09-12 DIAGNOSIS — J301 Allergic rhinitis due to pollen: Secondary | ICD-10-CM | POA: Diagnosis not present

## 2022-09-16 ENCOUNTER — Other Ambulatory Visit: Payer: Self-pay | Admitting: Cardiovascular Disease

## 2022-09-17 ENCOUNTER — Other Ambulatory Visit: Payer: Self-pay | Admitting: Cardiovascular Disease

## 2022-09-18 DIAGNOSIS — Z23 Encounter for immunization: Secondary | ICD-10-CM | POA: Diagnosis not present

## 2022-09-26 DIAGNOSIS — J301 Allergic rhinitis due to pollen: Secondary | ICD-10-CM | POA: Diagnosis not present

## 2022-09-28 DIAGNOSIS — Z23 Encounter for immunization: Secondary | ICD-10-CM | POA: Diagnosis not present

## 2022-10-03 DIAGNOSIS — J301 Allergic rhinitis due to pollen: Secondary | ICD-10-CM | POA: Diagnosis not present

## 2022-10-10 DIAGNOSIS — J301 Allergic rhinitis due to pollen: Secondary | ICD-10-CM | POA: Diagnosis not present

## 2022-10-17 DIAGNOSIS — J301 Allergic rhinitis due to pollen: Secondary | ICD-10-CM | POA: Diagnosis not present

## 2022-10-24 DIAGNOSIS — J301 Allergic rhinitis due to pollen: Secondary | ICD-10-CM | POA: Diagnosis not present

## 2022-11-07 DIAGNOSIS — J301 Allergic rhinitis due to pollen: Secondary | ICD-10-CM | POA: Diagnosis not present

## 2022-11-14 DIAGNOSIS — J301 Allergic rhinitis due to pollen: Secondary | ICD-10-CM | POA: Diagnosis not present

## 2022-11-21 DIAGNOSIS — J301 Allergic rhinitis due to pollen: Secondary | ICD-10-CM | POA: Diagnosis not present

## 2022-11-22 DIAGNOSIS — F39 Unspecified mood [affective] disorder: Secondary | ICD-10-CM | POA: Diagnosis not present

## 2022-11-22 DIAGNOSIS — F410 Panic disorder [episodic paroxysmal anxiety] without agoraphobia: Secondary | ICD-10-CM | POA: Diagnosis not present

## 2022-11-22 DIAGNOSIS — F1011 Alcohol abuse, in remission: Secondary | ICD-10-CM | POA: Diagnosis not present

## 2022-11-22 DIAGNOSIS — F411 Generalized anxiety disorder: Secondary | ICD-10-CM | POA: Diagnosis not present

## 2022-11-23 DIAGNOSIS — J301 Allergic rhinitis due to pollen: Secondary | ICD-10-CM | POA: Diagnosis not present

## 2022-11-28 DIAGNOSIS — J301 Allergic rhinitis due to pollen: Secondary | ICD-10-CM | POA: Diagnosis not present

## 2022-12-05 DIAGNOSIS — H60331 Swimmer's ear, right ear: Secondary | ICD-10-CM | POA: Diagnosis not present

## 2022-12-05 DIAGNOSIS — J301 Allergic rhinitis due to pollen: Secondary | ICD-10-CM | POA: Diagnosis not present

## 2022-12-12 DIAGNOSIS — J301 Allergic rhinitis due to pollen: Secondary | ICD-10-CM | POA: Diagnosis not present

## 2022-12-19 DIAGNOSIS — J301 Allergic rhinitis due to pollen: Secondary | ICD-10-CM | POA: Diagnosis not present

## 2022-12-23 ENCOUNTER — Other Ambulatory Visit: Payer: Self-pay | Admitting: Cardiovascular Disease

## 2022-12-24 ENCOUNTER — Other Ambulatory Visit: Payer: Self-pay | Admitting: Cardiovascular Disease

## 2022-12-24 NOTE — Telephone Encounter (Signed)
Please schedule F/U appt for further refills. Thank you!

## 2022-12-24 NOTE — Telephone Encounter (Signed)
Please contact patient for overdue 12 month f/u. Refills pending appt. Thank you

## 2022-12-26 DIAGNOSIS — J301 Allergic rhinitis due to pollen: Secondary | ICD-10-CM | POA: Diagnosis not present

## 2022-12-27 DIAGNOSIS — J0140 Acute pansinusitis, unspecified: Secondary | ICD-10-CM | POA: Diagnosis not present

## 2022-12-27 DIAGNOSIS — H60333 Swimmer's ear, bilateral: Secondary | ICD-10-CM | POA: Diagnosis not present

## 2023-01-01 ENCOUNTER — Ambulatory Visit: Payer: Medicare Other | Admitting: Physician Assistant

## 2023-01-02 DIAGNOSIS — J301 Allergic rhinitis due to pollen: Secondary | ICD-10-CM | POA: Diagnosis not present

## 2023-01-09 DIAGNOSIS — J301 Allergic rhinitis due to pollen: Secondary | ICD-10-CM | POA: Diagnosis not present

## 2023-01-11 NOTE — Telephone Encounter (Signed)
Scheduled 03/26

## 2023-01-14 ENCOUNTER — Other Ambulatory Visit: Payer: Self-pay | Admitting: Nurse Practitioner

## 2023-01-14 DIAGNOSIS — E039 Hypothyroidism, unspecified: Secondary | ICD-10-CM

## 2023-01-16 DIAGNOSIS — J301 Allergic rhinitis due to pollen: Secondary | ICD-10-CM | POA: Diagnosis not present

## 2023-01-18 ENCOUNTER — Other Ambulatory Visit: Payer: Self-pay | Admitting: Cardiovascular Disease

## 2023-01-18 DIAGNOSIS — I4821 Permanent atrial fibrillation: Secondary | ICD-10-CM

## 2023-01-18 NOTE — Telephone Encounter (Signed)
Refill request

## 2023-01-18 NOTE — Telephone Encounter (Signed)
Eliquis '5mg'$  refill request received. Patient is 77 years old, weight-78.9kg, Crea- 1.18 on 04/04/22, Diagnosis-Afib, and last seen by Dr. Fletcher Anon on 12/15/21 and pending an appt on 02/12/23 with Christell Faith. Dose is appropriate based on dosing criteria. Will send in refill to requested pharmacy.

## 2023-01-23 DIAGNOSIS — J301 Allergic rhinitis due to pollen: Secondary | ICD-10-CM | POA: Diagnosis not present

## 2023-01-28 ENCOUNTER — Telehealth: Payer: Self-pay | Admitting: Cardiovascular Disease

## 2023-01-28 DIAGNOSIS — I4821 Permanent atrial fibrillation: Secondary | ICD-10-CM

## 2023-01-28 MED ORDER — APIXABAN 5 MG PO TABS: ORAL_TABLET | ORAL | 0 refills | Status: DC

## 2023-01-28 NOTE — Telephone Encounter (Signed)
Patient calling the office for samples of medication:   1.  What medication and dosage are you requesting samples for? ELIQUIS 5 MG TABS tablet   2.  Are you currently out of this medication? No, has 1 pill left

## 2023-01-28 NOTE — Telephone Encounter (Signed)
Sample request for Eliquis received. Indication: Afib  Last office visit: 12/15/21 Fletcher Anon) Scr: 1.18 (04/04/22) Age: 77 Weight: 78.9kg  Pt overdue to see provider; however, pt has a scheduled appt with Christell Faith, PA on 02/12/23.   Eliquis '5mg'$  BID is appropriate dose.

## 2023-01-28 NOTE — Telephone Encounter (Signed)
Patient has been made aware that samples are available.   Medication Samples have been provided to the patient.  Drug name: Eliquis       Strength: 5 mg        Qty: 2 boxes  LOT: [id number]  Exp.Date: 6/25

## 2023-01-31 ENCOUNTER — Other Ambulatory Visit: Payer: Self-pay

## 2023-01-31 ENCOUNTER — Telehealth: Payer: Self-pay | Admitting: Nurse Practitioner

## 2023-01-31 NOTE — Telephone Encounter (Signed)
Lvm to schedule follow up for refills-Toni

## 2023-02-01 ENCOUNTER — Ambulatory Visit: Payer: Medicare Other | Admitting: Nurse Practitioner

## 2023-02-06 ENCOUNTER — Ambulatory Visit: Payer: Medicare Other | Admitting: Nurse Practitioner

## 2023-02-06 DIAGNOSIS — J301 Allergic rhinitis due to pollen: Secondary | ICD-10-CM | POA: Diagnosis not present

## 2023-02-11 ENCOUNTER — Telehealth: Payer: Self-pay | Admitting: Cardiovascular Disease

## 2023-02-11 ENCOUNTER — Other Ambulatory Visit: Payer: Self-pay | Admitting: Cardiovascular Disease

## 2023-02-11 DIAGNOSIS — I4821 Permanent atrial fibrillation: Secondary | ICD-10-CM

## 2023-02-11 MED ORDER — DIGOXIN 125 MCG PO TABS: ORAL_TABLET | ORAL | 0 refills | Status: DC

## 2023-02-11 MED ORDER — LOSARTAN POTASSIUM 25 MG PO TABS: ORAL_TABLET | ORAL | 0 refills | Status: DC

## 2023-02-11 MED ORDER — APIXABAN 5 MG PO TABS: ORAL_TABLET | ORAL | 1 refills | Status: DC

## 2023-02-11 NOTE — Telephone Encounter (Signed)
Refill was sent in today along with other medications.

## 2023-02-11 NOTE — Telephone Encounter (Signed)
Prescription refill request for Eliquis received. Indication: AF Last office visit: 12/15/21  Rod Can MD  (Appt 03/12/23) Scr: 1.18 on 04/04/22 Age: 77 Weight: 80.4kg  Based on above findings Eliquis 5mg  twice daily is the appropriate dose.  Refill approved.  Pt has an appt with R Dunn PA-C on 03/12/23.  Will need lab work at that time.

## 2023-02-11 NOTE — Progress Notes (Deleted)
Cardiology Office Note    Date:  02/11/2023   ID:  Jeffery Shaw, DOB 11-02-1946, MRN [id number]  PCP:  Jonetta Osgood, NP  Cardiologist:  Kathlyn Sacramento, MD  Electrophysiologist:  None   Chief Complaint: ***  History of Present Illness:   Jeffery Shaw is a 77 y.o. male with history of ***  ***   Labs independently reviewed: 03/2022 - TSH 0.125, free T4 normal, TC 166, TG 131, HDL 42, LDL 101, BUN 12, SCr 1.18, potassium 4.2, albumin 4.0, AST/ALT normal, Hgb 16.1, PLT 240 11/2021 - digoxin 0.8  Past Medical History:  Diagnosis Date   Asthma    Cataract    both eyes   Chronic atrial fibrillation ([place])    a. Rate-controlled; b. CHA2DS2VASc = 2--> Eliquis.   Chronic systolic heart failure ([place])    a. 07/2012 TEE: EF 30-35%; b. 07/2012 Cath: no significant coronary artery disease with an ejection fraction of 35%. Normal filling pressures with only mild PAH; c. 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 Nl PASP.   Emphysema, unspecified ([place])    GERD (gastroesophageal reflux disease)    Hyperlipidemia    Hypothyroidism    Mildly dilated aortic root ([place])    a. 12/2013 Echo: Ao root 46mm.   NICM (nonischemic cardiomyopathy) ([place])    a. 07/2012 TEE: EF 30-35%; b. 07/2012 Cath: nonobs dzs, EF 35%; c. 12/2013 Echo: EF 40-45%.   Palpitations    Pancreatitis 1980   due to ETOH   Panic attacks    PFO (patent foramen ovale)    a. 2013 TEE & Cath: Tunnelled PFO with a small left-to-right atrial shunt noted on TEE with a QP/QS ratio of 1.37 by cardiac cath; b. 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   Sleep apnea    CPAP    Past Surgical History:  Procedure Laterality Date   CARDIAC CATHETERIZATION  07/2012   [facility]   CARDIOVERSION  09/15/12   CATARACT EXTRACTION W/PHACO Left 04/17/2016   Procedure: CATARACT EXTRACTION PHACO AND INTRAOCULAR LENS PLACEMENT ([place]);  Surgeon: Birder Robson, MD;   Location: [facility] ORS;  Service: Ophthalmology;  Laterality: Left;  [place] 32.9 AP% 20.1 CDE 6.59 FLUID PACK LOT # [id number]   CATARACT EXTRACTION W/PHACO Right 05/21/2016   Procedure: CATARACT EXTRACTION PHACO AND INTRAOCULAR LENS PLACEMENT (IOC);  Surgeon: Birder Robson, MD;  Location: [facility] ORS;  Service: Ophthalmology;  Laterality: Right;  [place] 31.0 AP% 20.4 CDE 6.30 Fluid Pck Lot # [id number]   CYSTOSCOPY W/ URETEROSCOPY     TEE WITHOUT CARDIOVERSION      Current Medications: No outpatient medications have been marked as taking for the 02/12/23 encounter (Appointment) with Rise Mu, PA-C.    Allergies:   Patient has no known allergies.   Social History   Socioeconomic History   Marital status: Married    Spouse name: Not on file   Number of children: Not on file   Years of education: Not on file   Highest education level: Not on file  Occupational History   Not on file  Tobacco Use   Smoking status: Former    Packs/day: 0.50    Years: 20.00    Additional pack years: 0.00    Total pack years: 10.00    Types: Cigarettes    Quit date: 08/07/2008    Years since quitting: 14.5   Smokeless tobacco: Never  Vaping Use   Vaping Use: Never used  Substance and Sexual Activity   Alcohol use: No   Drug use: No   Sexual activity: Not on file  Other Topics Concern   Not on file  Social History Narrative   Not on file   Social Determinants of Health   Financial Resource Strain: Not on file  Food Insecurity: Not on file  Transportation Needs: Not on file  Physical Activity: Not on file  Stress: Not on file  Social Connections: Not on file     Family History:  The patient's family history includes Arrhythmia in his sister and sister; Hyperlipidemia in his brother.  ROS:   12-point review of systems is negative unless otherwise noted in the HPI.   EKGs/Labs/Other Studies Reviewed:    Studies reviewed were summarized above. The additional studies were reviewed today:  2D  echo 12/2013: - Left ventricle: The cavity size was normal. Wall thickness    was normal. Systolic function was mildly to moderately    reduced. The estimated ejection fraction was in the range    of 40% to 45%. Wall motion was normal; there were no    regional wall motion abnormalities. The study is not    technically sufficient to allow evaluation of LV diastolic    function.  - Aorta: Aortic root dimension: 51mm (ED).  - Aortic root: The aortic root was mildly dilated.  - Mitral valve: Mild regurgitation.  - Left atrium: The atrium was mildly dilated.  - Right ventricle: The cavity size was mildly dilated. Wall    thickness was normal.  - Right atrium: The atrium was mildly dilated.  - Atrial septum: There was a small secundum atrial septal    defect. There was a small left-to-right shunt through an    atrial septal defect.  - Pulmonary arteries: Systolic pressure was within the    normal range.    EKG:  EKG is ordered today.  The EKG ordered today demonstrates ***  Recent Labs: 04/04/2022: ALT 24; BUN 12; Creatinine, Ser 1.18; Hemoglobin 16.1; Platelets 240; Potassium 4.2; Sodium 138; TSH 0.125  Recent Lipid Panel    Component Value Date/Time   CHOL 166 04/04/2022 0913   TRIG 131 04/04/2022 0913   HDL 42 04/04/2022 0913   CHOLHDL 4.0 04/04/2022 0913   LDLCALC 101 (H) 04/04/2022 0913    PHYSICAL EXAM:    VS:  There were no vitals taken for this visit.  BMI: There is no height or weight on file to calculate BMI.  Physical Exam  Wt Readings from Last 3 Encounters:  04/03/22 174 lb (78.9 kg)  12/15/21 177 lb 4 oz (80.4 kg)  08/07/21 172 lb (78 kg)     ASSESSMENT & PLAN:   ***   {Are you ordering a CV Procedure (e.g. stress test, cath, DCCV, TEE, etc)?   Press F2        :[id number]     Disposition: F/u with Dr. Fletcher Anon or an APP in ***.   Medication Adjustments/Labs and Tests Ordered: Current medicines are reviewed at length with the patient today.  Concerns  regarding medicines are outlined above. Medication changes, Labs and Tests ordered today are summarized above and listed in the Patient Instructions accessible in Encounters.   SignedChristell Faith, PA-C 02/11/2023 10:12 AM     [place] [address] [place], [place] [address] [phone]

## 2023-02-11 NOTE — Telephone Encounter (Signed)
*  STAT* If patient is at the pharmacy, call can be transferred to refill team.   1. Which medications need to be refilled? (please list name of each medication and dose if known)  digoxin (LANOXIN) 0.125 MG tablet losartan (COZAAR) 25 MG tablet apixaban (ELIQUIS) 5 MG TABS tablet  2. Which pharmacy/location (including street and city if local pharmacy) is medication to be sent to? [facility] [place], [address] [facility] [address]  3. Do they need a 30 day or 90 day supply?  90 day supply

## 2023-02-11 NOTE — Telephone Encounter (Signed)
Left a message for the patient to call back concerning the samples. He did receive a 2 week supply two weeks ago.

## 2023-02-11 NOTE — Telephone Encounter (Signed)
Patient calling the office for samples of medication:   1.  What medication and dosage are you requesting samples for? apixaban (ELIQUIS) 5 MG TABS tablet  2.  Are you currently out of this medication? Yes    

## 2023-02-11 NOTE — Telephone Encounter (Signed)
Please assist patient with Eliquis refill. Thank you  

## 2023-02-11 NOTE — Addendum Note (Signed)
Addended by: Malen Gauze on: 02/11/2023 01:43 PM   Modules accepted: Orders

## 2023-02-12 ENCOUNTER — Ambulatory Visit: Payer: Medicare Other | Admitting: Physician Assistant

## 2023-02-12 DIAGNOSIS — J301 Allergic rhinitis due to pollen: Secondary | ICD-10-CM | POA: Diagnosis not present

## 2023-02-13 DIAGNOSIS — J301 Allergic rhinitis due to pollen: Secondary | ICD-10-CM | POA: Diagnosis not present

## 2023-02-20 DIAGNOSIS — J301 Allergic rhinitis due to pollen: Secondary | ICD-10-CM | POA: Diagnosis not present

## 2023-02-21 DIAGNOSIS — F411 Generalized anxiety disorder: Secondary | ICD-10-CM | POA: Diagnosis not present

## 2023-02-21 DIAGNOSIS — F1011 Alcohol abuse, in remission: Secondary | ICD-10-CM | POA: Diagnosis not present

## 2023-02-21 DIAGNOSIS — F410 Panic disorder [episodic paroxysmal anxiety] without agoraphobia: Secondary | ICD-10-CM | POA: Diagnosis not present

## 2023-02-21 DIAGNOSIS — F39 Unspecified mood [affective] disorder: Secondary | ICD-10-CM | POA: Diagnosis not present

## 2023-02-27 DIAGNOSIS — J301 Allergic rhinitis due to pollen: Secondary | ICD-10-CM | POA: Diagnosis not present

## 2023-03-06 ENCOUNTER — Telehealth: Payer: Self-pay | Admitting: Cardiovascular Disease

## 2023-03-06 DIAGNOSIS — J301 Allergic rhinitis due to pollen: Secondary | ICD-10-CM | POA: Diagnosis not present

## 2023-03-06 DIAGNOSIS — I4821 Permanent atrial fibrillation: Secondary | ICD-10-CM

## 2023-03-06 MED ORDER — APIXABAN 5 MG PO TABS: ORAL_TABLET | ORAL | 0 refills | Status: DC

## 2023-03-06 NOTE — Telephone Encounter (Signed)
Refill request for Eliquis samples: Indication: AF Last office visit: 12/15/21  Jeffery Files MD  (Has appt 03/12/23) Scr: 1.18 on 04/04/22  Epic Age: 77 Weight: 78.9kg  Based on above findings Eliquis 5mg  twice daily is the appropriate dose.  OK to provide samples if available.

## 2023-03-06 NOTE — Telephone Encounter (Signed)
Left a message for the patient to call back.  Samples have been left up front for him.

## 2023-03-06 NOTE — Telephone Encounter (Signed)
Patient calling the office for samples of medication:   1.  What medication and dosage are you requesting samples for? Apixaban (ELIQUIS) 5 MG TABS tablet  2.  Are you currently out of this medication?   No, but he only has 1 tablet remaining.

## 2023-03-06 NOTE — Telephone Encounter (Signed)
OK to provide Eliquis samples if available. Thanks

## 2023-03-06 NOTE — Addendum Note (Signed)
Addended by: Sandi Mariscal on: 03/06/2023 04:09 PM   Modules accepted: Orders

## 2023-03-09 NOTE — Progress Notes (Deleted)
Cardiology Office Note    Date:  03/09/2023   ID:  Jeffery, Shaw 05/09/1946, MRN [id number]  PCP:  Sallyanne Kuster, NP  Cardiologist:  Lorine Bears, MD  Electrophysiologist:  None   Chief Complaint: Follow-up  History of Present Illness:   Jeffery Shaw is a 77 y.o. male with history of permanent Afib on Eliquis with prior unsuccessful DCCV, HFrEF secondary to NICM, small PFO, HLD, hypothyroidism, OSA on CPAP, and GERD who presents for follow-up of NICM, A-fib, and small PFO.  TEE in 2013, showed an EF of 30-35% with normal atrial size along with a tunneled PFO with small to moderate left atrial shunting by color Doppler. A R/LHC in 07/2012 showed no significant CAD with a QP/QS shunt ratio of 1.37. His most recent echo from 12/2013 showed an EF of 40-45% as detailed below. He has been maintained with rate control strategy given prior unsuccessful DCCV, even on amiodarone.  He was last seen in the office in 11/2021 and remained without symptoms of angina or cardiac decompensation.  He was taking apixaban once daily rather than twice daily by mistake.  ***   Labs independently reviewed: 03/2022 - TSH 0.125, free T4 normal, TC 166, TG 131, HDL 42, LDL 101, BUN 12, SCr 1.18, potassium 4.2, albumin 4.0, AST/ALT normal, Hgb 16.1, PLT 240 11/2021 - digoxin 0.8  Past Medical History:  Diagnosis Date   Asthma    Cataract    both eyes   Chronic atrial fibrillation ([facility])    a. Rate-controlled; b. CHA2DS2VASc = 2--> Eliquis.   Chronic systolic heart failure ([facility])    a. 07/2012 TEE: EF 30-35%; b. 07/2012 Cath: no significant coronary artery disease with an ejection fraction of 35%. Normal filling pressures with only mild PAH; c. 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 Nl PASP.   Emphysema, unspecified ([facility])    GERD (gastroesophageal reflux disease)    Hyperlipidemia    Hypothyroidism    Mildly dilated aortic root  ([facility])    a. 12/2013 Echo: Ao root 40mm.   NICM (nonischemic cardiomyopathy) ([facility])    a. 07/2012 TEE: EF 30-35%; b. 07/2012 Cath: nonobs dzs, EF 35%; c. 12/2013 Echo: EF 40-45%.   Palpitations    Pancreatitis 1980   due to ETOH   Panic attacks    PFO (patent foramen ovale)    a. 2013 TEE & Cath: Tunnelled PFO with a small left-to-right atrial shunt noted on TEE with a QP/QS ratio of 1.37 by cardiac cath; b. 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   Sleep apnea    CPAP    Past Surgical History:  Procedure Laterality Date   CARDIAC CATHETERIZATION  07/2012   [facility]   CARDIOVERSION  09/15/12   CATARACT EXTRACTION W/PHACO Left 04/17/2016   Procedure: CATARACT EXTRACTION PHACO AND INTRAOCULAR LENS PLACEMENT (IOC);  Surgeon: Galen Manila, MD;  Location: [facility] ORS;  Service: Ophthalmology;  Laterality: Left;  [place] 32.9 AP% 20.1 CDE 6.59 FLUID PACK LOT # [id number] H   CATARACT EXTRACTION W/PHACO Right 05/21/2016   Procedure: CATARACT EXTRACTION PHACO AND INTRAOCULAR LENS PLACEMENT (I[facility]);  Surgeon: Galen Manila, MD;  Location: [facility];  Service: Ophthalmology;  Laterality: Right;  [place] 31.0 AP% 20.4 CDE 6.30 Fluid Pck Lot # [id number] H   CYSTOSCOPY W/ URETEROSCOPY     TEE WITHOUT CARDIOVERSION      Current Medications: No outpatient medications  have been marked as taking for the 03/12/23 encounter (Appointment) with Sondra Barges, PA-C.    Allergies:   Patient has no known allergies.   Social History   Socioeconomic History   Marital status: Married    Spouse name: Not on file   Number of children: Not on file   Years of education: Not on file   Highest education level: Not on file  Occupational History   Not on file  Tobacco Use   Smoking status: Former    Packs/day: 0.50    Years: 20.00    Additional pack years: 0.00    Total pack years: 10.00    Types: Cigarettes    Quit date: 08/07/2008    Years since quitting: 14.5   Smokeless tobacco: Never  Vaping Use   Vaping  Use: Never used  Substance and Sexual Activity   Alcohol use: No   Drug use: No   Sexual activity: Not on file  Other Topics Concern   Not on file  Social History Narrative   Not on file   Social Determinants of Health   Financial Resource Strain: Not on file  Food Insecurity: Not on file  Transportation Needs: Not on file  Physical Activity: Not on file  Stress: Not on file  Social Connections: Not on file     Family History:  The patient's family history includes Arrhythmia in his sister and sister; Hyperlipidemia in his brother.  ROS:   12-point review of systems is negative unless otherwise noted in the HPI.   EKGs/Labs/Other Studies Reviewed:    Studies reviewed were summarized above. The additional studies were reviewed today:  2D echo 12/2013: - Left ventricle: The cavity size was normal. Wall thickness    was normal. Systolic function was mildly to moderately    reduced. The estimated ejection fraction was in the range    of 40% to 45%. Wall motion was normal; there were no    regional wall motion abnormalities. The study is not    technically sufficient to allow evaluation of LV diastolic    function.  - Aorta: Aortic root dimension: 40mm (ED).  - Aortic root: The aortic root was mildly dilated.  - Mitral valve: Mild regurgitation.  - Left atrium: The atrium was mildly dilated.  - Right ventricle: The cavity size was mildly dilated. Wall    thickness was normal.  - Right atrium: The atrium was mildly dilated.  - Atrial septum: There was a small secundum atrial septal    defect. There was a small left-to-right shunt through an    atrial septal defect.  - Pulmonary arteries: Systolic pressure was within the    normal range.    EKG:  EKG is ordered today.  The EKG ordered today demonstrates ***  Recent Labs: 04/04/2022: ALT 24; BUN 12; Creatinine, Ser 1.18; Hemoglobin 16.1; Platelets 240; Potassium 4.2; Sodium 138; TSH 0.125  Recent Lipid Panel     Component Value Date/Time   CHOL 166 04/04/2022 0913   TRIG 131 04/04/2022 0913   HDL 42 04/04/2022 0913   CHOLHDL 4.0 04/04/2022 0913   LDLCALC 101 (H) 04/04/2022 0913    PHYSICAL EXAM:    VS:  There were no vitals taken for this visit.  BMI: There is no height or weight on file to calculate BMI.  Physical Exam  Wt Readings from Last 3 Encounters:  04/03/22 174 lb (78.9 kg)  12/15/21 177 lb 4 oz (80.4 kg)  08/07/21 172  lb (78 kg)     ASSESSMENT & PLAN:   Chronic A-fib:  HFrEF secondary to NICM:  Small PFO:  HLD: LDL 101.   {Are you ordering a CV Procedure (e.g. stress test, cath, DCCV, TEE, etc)?   Press F2        :[id number]}     Disposition: F/u with Dr. Kirke Corin or an APP in ***.   Medication Adjustments/Labs and Tests Ordered: Current medicines are reviewed at length with the patient today.  Concerns regarding medicines are outlined above. Medication changes, Labs and Tests ordered today are summarized above and listed in the Patient Instructions accessible in Encounters.   SignedEula Listen, PA-C 03/09/2023 11:51 AM     [facility] [place] [address] [place], [place] [address] [phone]

## 2023-03-11 NOTE — Telephone Encounter (Signed)
Samples picked up by Pt 03/11/2023.

## 2023-03-12 ENCOUNTER — Ambulatory Visit: Payer: Medicare Other | Admitting: Physician Assistant

## 2023-03-13 ENCOUNTER — Other Ambulatory Visit: Payer: Self-pay | Admitting: Cardiovascular Disease

## 2023-03-13 DIAGNOSIS — J301 Allergic rhinitis due to pollen: Secondary | ICD-10-CM | POA: Diagnosis not present

## 2023-03-20 DIAGNOSIS — J301 Allergic rhinitis due to pollen: Secondary | ICD-10-CM | POA: Diagnosis not present

## 2023-03-23 ENCOUNTER — Other Ambulatory Visit: Payer: Self-pay | Admitting: Nurse Practitioner

## 2023-03-23 DIAGNOSIS — E039 Hypothyroidism, unspecified: Secondary | ICD-10-CM

## 2023-03-24 ENCOUNTER — Other Ambulatory Visit: Payer: Self-pay | Admitting: Cardiovascular Disease

## 2023-03-25 NOTE — Telephone Encounter (Signed)
Please refill if appropriate during office visit on 03/29/23. Patient overdue for appointment. Thank you!

## 2023-03-27 DIAGNOSIS — J301 Allergic rhinitis due to pollen: Secondary | ICD-10-CM | POA: Diagnosis not present

## 2023-03-28 ENCOUNTER — Other Ambulatory Visit: Payer: Self-pay | Admitting: Cardiovascular Disease

## 2023-03-28 MED ORDER — DIGOXIN 125 MCG PO TABS: ORAL_TABLET | ORAL | 0 refills | Status: DC

## 2023-03-29 ENCOUNTER — Ambulatory Visit: Payer: Medicare Other | Admitting: Cardiology

## 2023-03-29 ENCOUNTER — Other Ambulatory Visit: Payer: Self-pay

## 2023-04-04 ENCOUNTER — Ambulatory Visit: Payer: Medicare Other | Admitting: Nurse Practitioner

## 2023-04-05 ENCOUNTER — Ambulatory Visit: Payer: Medicare Other | Admitting: Cardiology

## 2023-04-10 DIAGNOSIS — J301 Allergic rhinitis due to pollen: Secondary | ICD-10-CM | POA: Diagnosis not present

## 2023-04-15 ENCOUNTER — Other Ambulatory Visit: Payer: Self-pay | Admitting: Cardiovascular Disease

## 2023-04-15 ENCOUNTER — Telehealth: Payer: Self-pay | Admitting: Physician Assistant

## 2023-04-15 NOTE — Telephone Encounter (Addendum)
Pt called answering service for Rx refill. There was no answer. Left message for pt to call back. Tereso Newcomer, PA-C    04/15/2023 1:09 PM    2nd attempt. No answer. Message left for pt to call back if he needs anything. Tereso Newcomer, PA-C    04/15/2023 3:02 PM

## 2023-04-17 DIAGNOSIS — J301 Allergic rhinitis due to pollen: Secondary | ICD-10-CM | POA: Diagnosis not present

## 2023-04-22 ENCOUNTER — Ambulatory Visit (INDEPENDENT_AMBULATORY_CARE_PROVIDER_SITE_OTHER): Payer: Medicare Other | Admitting: Nurse Practitioner

## 2023-04-22 ENCOUNTER — Encounter: Payer: Self-pay | Admitting: Nurse Practitioner

## 2023-04-22 VITALS — BP 115/75 | HR 100 | Temp 98.5°F | Resp 16 | Ht 72.0 in | Wt 177.0 lb

## 2023-04-22 DIAGNOSIS — R300 Dysuria: Secondary | ICD-10-CM | POA: Diagnosis not present

## 2023-04-22 DIAGNOSIS — J329 Chronic sinusitis, unspecified: Secondary | ICD-10-CM | POA: Diagnosis not present

## 2023-04-22 DIAGNOSIS — Z1211 Encounter for screening for malignant neoplasm of colon: Secondary | ICD-10-CM | POA: Diagnosis not present

## 2023-04-22 DIAGNOSIS — E559 Vitamin D deficiency, unspecified: Secondary | ICD-10-CM

## 2023-04-22 DIAGNOSIS — E039 Hypothyroidism, unspecified: Secondary | ICD-10-CM | POA: Diagnosis not present

## 2023-04-22 DIAGNOSIS — E538 Deficiency of other specified B group vitamins: Secondary | ICD-10-CM

## 2023-04-22 DIAGNOSIS — E782 Mixed hyperlipidemia: Secondary | ICD-10-CM

## 2023-04-22 DIAGNOSIS — Z1212 Encounter for screening for malignant neoplasm of rectum: Secondary | ICD-10-CM | POA: Diagnosis not present

## 2023-04-22 DIAGNOSIS — I4821 Permanent atrial fibrillation: Secondary | ICD-10-CM

## 2023-04-22 DIAGNOSIS — Z0001 Encounter for general adult medical examination with abnormal findings: Secondary | ICD-10-CM

## 2023-04-22 DIAGNOSIS — G2581 Restless legs syndrome: Secondary | ICD-10-CM

## 2023-04-22 MED ORDER — FLUTICASONE PROPIONATE 50 MCG/ACT NA SUSP
NASAL | 1 refills | Status: DC
Start: 2023-04-22 — End: 2024-05-21

## 2023-04-22 MED ORDER — CYCLOBENZAPRINE HCL 10 MG PO TABS
10.0000 mg | ORAL_TABLET | Freq: Three times a day (TID) | ORAL | 1 refills | Status: DC | PRN
Start: 2023-04-22 — End: 2024-05-21

## 2023-04-22 MED ORDER — LEVOTHYROXINE SODIUM 88 MCG PO TABS
ORAL_TABLET | ORAL | 3 refills | Status: AC
Start: 2023-04-22 — End: ?

## 2023-04-22 MED ORDER — ROPINIROLE HCL 0.5 MG PO TABS: 0.5000 mg | ORAL_TABLET | ORAL | 1 refills | Status: DC | PRN

## 2023-04-22 NOTE — Progress Notes (Signed)
[facility] [address] [place], [place] [address]  Internal MEDICINE  Office Visit Note  Patient Name: Jeffery Shaw  [id number]  [id number]  Date of Service: 04/22/2023  Chief Complaint  Patient presents with   Gastroesophageal Reflux   Hyperlipidemia   Medicare Wellness    HPI Jeffery Shaw presents for an annual well visit and physical exam.  Well-appearing 77 y.o. male with AFIB, chronic systolic heart failure, PFO, hypothyroidism, insomnia, depression, anxiety, and RLS Routine CRC screening: overdue now.  Labs: due for routine  New or worsening pain: chronic low back pain.  Going through separation with wife, joint custody of 15 year kid Sees Dr. Maryruth Bun, working through marital issues -- has him on zoloft, oxcarbazapine, lunesta       04/22/2023    2:46 PM 04/03/2022    2:16 PM 01/24/2021    2:21 PM  MMSE - Mini Mental State Exam  Orientation to time 5 5 5   Orientation to Place 5 5 5   Registration 3 3 3   Attention/ Calculation 5 5 5   Recall 3 3 3   Language- name 2 objects 2 2 2   Language- repeat 1 1 1   Language- follow 3 step command 3 3 3   Language- read & follow direction 1 1 1   Write a sentence 1 1 1   Copy design 1 1 1   Total score 30 30 30     Functional Status Survey: Is the patient deaf or have difficulty hearing?: No Does the patient have difficulty seeing, even when wearing glasses/contacts?: No Does the patient have difficulty concentrating, remembering, or making decisions?: No Does the patient have difficulty walking or climbing stairs?: No Does the patient have difficulty dressing or bathing?: No Does the patient have difficulty doing errands alone such as visiting a doctor's office or shopping?: No     03/10/2020    4:01 PM 01/24/2021    2:20 PM 08/07/2021    3:06 PM 04/03/2022    2:15 PM 04/22/2023    2:45 PM  Fall Risk  Falls in the past year? 0 0 0 0 0  Was there an injury with Fall?     0  Fall Risk Category Calculator     0  (RETIRED)  Patient Fall Risk Level   Low fall risk Low fall risk   Patient at Risk for Falls Due to  No Fall Risks No Fall Risks No Fall Risks No Fall Risks  Fall risk Follow up  Falls evaluation completed Falls evaluation completed Falls evaluation completed Falls evaluation completed       04/22/2023    2:45 PM  Depression screen PHQ 2/9  Decreased Interest 0  Down, Depressed, Hopeless 0  PHQ - 2 Score 0        No data to display            Current Medication: Outpatient Encounter Medications as of 04/22/2023  Medication Sig   albuterol (VENTOLIN HFA) 108 (90 Base) MCG/ACT inhaler Inhale 2 puffs into the lungs every 6 (six) hours as needed for wheezing or shortness of breath.   apixaban (ELIQUIS) 5 MG TABS tablet TAKE 1 TABLET(5 MG) BY MOUTH TWICE DAILY   buPROPion (WELLBUTRIN SR) 100 MG 12 hr tablet Take 100 mg by mouth daily.   cetirizine (ZYRTEC) 10 MG tablet Take 10 mg by mouth at bedtime.   cyclobenzaprine (FLEXERIL) 10 MG tablet Take 1 tablet (10 mg total) by mouth 3 (three) times daily as needed for muscle spasms.  digoxin (LANOXIN) 0.125 MG tablet TAKE 1 TABLET BY MOUTH DAILY   eszopiclone (LUNESTA) 1 MG TABS tablet Take 1 mg by mouth at bedtime as needed for sleep. Take immediately before bedtime   furosemide (LASIX) 20 MG tablet Take 1 tablet (20 mg total) by mouth daily as needed.   losartan (COZAAR) 25 MG tablet TAKE 1 TABLET BY MOUTH EVERY DAY, NEED OFFICE VISIT FOR REFILLS   metoprolol tartrate (LOPRESSOR) 100 MG tablet TAKE 1 TABLET(100 MG) BY MOUTH TWICE DAILY   Oxcarbazepine (TRILEPTAL) 300 MG tablet Take 300 mg by mouth daily.   sertraline (ZOLOFT) 100 MG tablet Take 100 mg by mouth daily. As per pt her taking 1/2 tablet   VITAMIN D PO Take by mouth daily.   [DISCONTINUED] clonazePAM (KLONOPIN) 0.5 MG tablet Take 0.5 tablets by mouth 3 (three) times daily as needed.   [DISCONTINUED] fluticasone (FLONASE) 50 MCG/ACT nasal spray SHAKE LIQUID AND USE 2 SPRAYS IN EACH  NOSTRIL TWICE DAILY   [DISCONTINUED] fluticasone (FLONASE) 50 MCG/ACT nasal spray Place into the nose daily as needed.   [DISCONTINUED] levothyroxine (SYNTHROID) 88 MCG tablet TAKE 1 TABLET(88 MCG) BY MOUTH DAILY   [DISCONTINUED] predniSONE (DELTASONE) 20 MG tablet Take by mouth as directed.   [DISCONTINUED] rOPINIRole (REQUIP) 0.5 MG tablet TAKE 1 TABLET BY MOUTH AS NEEDED   fluticasone (FLONASE) 50 MCG/ACT nasal spray SHAKE LIQUID AND USE 2 SPRAYS IN EACH NOSTRIL TWICE DAILY   levothyroxine (SYNTHROID) 88 MCG tablet TAKE 1 TABLET(88 MCG) BY MOUTH DAILY   rOPINIRole (REQUIP) 0.5 MG tablet Take 1 tablet (0.5 mg total) by mouth as needed.   No facility-administered encounter medications on file as of 04/22/2023.    Surgical History: Past Surgical History:  Procedure Laterality Date   CARDIAC CATHETERIZATION  07/2012   [facility]   CARDIOVERSION  09/15/12   CATARACT EXTRACTION W/PHACO Left 04/17/2016   Procedure: CATARACT EXTRACTION PHACO AND INTRAOCULAR LENS PLACEMENT (IOC);  Surgeon: Galen Manila, MD;  Location: [facility] ORS;  Service: Ophthalmology;  Laterality: Left;  [place] 32.9 AP% 20.1 CDE 6.59 FLUID PACK LOT # [id number]   CATARACT EXTRACTION W/PHACO Right 05/21/2016   Procedure: CATARACT EXTRACTION PHACO AND INTRAOCULAR LENS PLACEMENT (IOC);  Surgeon: Galen Manila, MD;  Location: [facility] ORS;  Service: Ophthalmology;  Laterality: Right;  [place] 31.0 AP% 20.4 CDE 6.30 Fluid Pck Lot # [id number]   CYSTOSCOPY W/ URETEROSCOPY     TEE WITHOUT CARDIOVERSION      Medical History: Past Medical History:  Diagnosis Date   Asthma    Cataract    both eyes   Chronic atrial fibrillation (HCC)    a. Rate-controlled; b. CHA2DS2VASc = 2--> Eliquis.   Chronic systolic heart failure (HCC)    a. 07/2012 TEE: EF 30-35%; b. 07/2012 Cath: no significant coronary artery disease with an ejection fraction of 35%. Normal filling pressures with only mild PAH; c. 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 Nl PASP.   Emphysema, unspecified (HCC)    GERD (gastroesophageal reflux disease)    Hyperlipidemia    Hypothyroidism    Mildly dilated aortic root (HCC)    a. 12/2013 Echo: Ao root 40mm.   NICM (nonischemic cardiomyopathy) (HCC)    a. 07/2012 TEE: EF 30-35%; b. 07/2012 Cath: nonobs dzs, EF 35%; c. 12/2013 Echo: EF 40-45%.   Palpitations    Pancreatitis 1980   due to ETOH   Panic attacks  PFO (patent foramen ovale)    a. 2013 TEE & Cath: Tunnelled PFO with a small left-to-right atrial shunt noted on TEE with a QP/QS ratio of 1.37 by cardiac cath; b. 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   Sleep apnea    CPAP    Family History: Family History  Problem Relation Age of Onset   Arrhythmia Sister        A-fib/stroke    Arrhythmia Sister    Hyperlipidemia Brother     Social History   Socioeconomic History   Marital status: Married    Spouse name: Not on file   Number of children: Not on file   Years of education: Not on file   Highest education level: Not on file  Occupational History   Not on file  Tobacco Use   Smoking status: Former    Packs/day: 0.50    Years: 20.00    Additional pack years: 0.00    Total pack years: 10.00    Types: Cigarettes    Quit date: 08/07/2008    Years since quitting: 14.7   Smokeless tobacco: Never  Vaping Use   Vaping Use: Never used  Substance and Sexual Activity   Alcohol use: No   Drug use: No   Sexual activity: Not on file  Other Topics Concern   Not on file  Social History Narrative   Not on file   Social Determinants of Health   Financial Resource Strain: Not on file  Food Insecurity: Not on file  Transportation Needs: Not on file  Physical Activity: Not on file  Stress: Not on file  Social Connections: Not on file  Intimate Partner Violence: Not on file      Review of Systems  Constitutional:  Negative for activity change, appetite change,  chills, fatigue, fever and unexpected weight change.  HENT: Negative.  Negative for congestion, ear pain, rhinorrhea, sore throat and trouble swallowing.   Eyes: Negative.   Respiratory: Negative.  Negative for cough, chest tightness, shortness of breath and wheezing.   Cardiovascular: Negative.  Negative for chest pain.  Gastrointestinal: Negative.  Negative for abdominal pain, blood in stool, constipation, diarrhea, nausea and vomiting.  Endocrine: Negative.   Genitourinary: Negative.  Negative for difficulty urinating, dysuria, frequency, hematuria and urgency.  Musculoskeletal: Negative.  Negative for arthralgias, back pain, joint swelling, myalgias and neck pain.  Skin: Negative.  Negative for rash and wound.  Allergic/Immunologic: Negative.  Negative for immunocompromised state.  Neurological: Negative.  Negative for dizziness, seizures, numbness and headaches.  Hematological: Negative.   Psychiatric/Behavioral: Negative.  Negative for behavioral problems, self-injury and suicidal ideas. The patient is not nervous/anxious.     Vital Signs: BP 115/75 Comment: 140/86  Pulse 100   Temp 98.5 F (36.9 C)   Resp 16   Ht 6' (1.829 m)   Wt 177 lb (80.3 kg)   SpO2 97%   BMI 24.01 kg/m    Physical Exam Vitals reviewed.  Constitutional:      General: He is awake. He is not in acute distress.    Appearance: Normal appearance. He is well-developed, well-groomed and normal weight. He is not ill-appearing or diaphoretic.  HENT:     Head: Normocephalic and atraumatic.     Right Ear: Tympanic membrane, ear canal and external ear normal.     Left Ear: Tympanic membrane, ear canal and external ear normal.     Nose: Nose normal. No congestion or rhinorrhea.  Mouth/Throat:     Lips: Pink.     Mouth: Mucous membranes are moist.     Pharynx: Oropharynx is clear. Uvula midline. No oropharyngeal exudate or posterior oropharyngeal erythema.  Eyes:     General: Lids are normal. Vision  grossly intact. Gaze aligned appropriately. No scleral icterus.       Right eye: No discharge.        Left eye: No discharge.     Extraocular Movements: Extraocular movements intact.     Conjunctiva/sclera: Conjunctivae normal.     Pupils: Pupils are equal, round, and reactive to light.     Funduscopic exam:    Right eye: Red reflex present.        Left eye: Red reflex present. Neck:     Thyroid: No thyromegaly.     Vascular: No JVD.     Trachea: Trachea and phonation normal. No tracheal deviation.  Cardiovascular:     Rate and Rhythm: Normal rate. Rhythm irregular.     Pulses: Normal pulses.     Heart sounds: Murmur heard.     No friction rub. No gallop.  Pulmonary:     Effort: Pulmonary effort is normal. No accessory muscle usage or respiratory distress.     Breath sounds: Normal breath sounds and air entry. No stridor. No decreased breath sounds, wheezing or rales.  Chest:     Chest wall: No tenderness.  Abdominal:     General: Bowel sounds are normal. There is no distension.     Palpations: Abdomen is soft. There is no shifting dullness, fluid wave, mass or pulsatile mass.     Tenderness: There is no abdominal tenderness. There is no guarding or rebound.  Musculoskeletal:        General: No tenderness or deformity. Normal range of motion.     Cervical back: Normal range of motion and neck supple.     Right lower leg: No edema.     Left lower leg: No edema.  Lymphadenopathy:     Cervical: No cervical adenopathy.  Skin:    General: Skin is warm and dry.     Capillary Refill: Capillary refill takes less than 2 seconds.     Coloration: Skin is not pale.     Findings: No erythema or rash.  Neurological:     Mental Status: He is alert and oriented to person, place, and time.     Cranial Nerves: No cranial nerve deficit.     Motor: No abnormal muscle tone.     Coordination: Coordination normal.     Deep Tendon Reflexes: Reflexes are normal and symmetric.  Psychiatric:         Mood and Affect: Mood normal.        Behavior: Behavior normal. Behavior is cooperative.        Thought Content: Thought content normal.        Judgment: Judgment normal.      Assessment/Plan: 1. Encounter for general adult medical examination with abnormal findings Age-appropriate preventive screenings and vaccinations discussed, annual physical exam completed. Routine labs for health maintenance ordered, see below. PHM updated. Refills ordered.  - CBC with Differential/Platelet - CMP14+EGFR - Lipid Profile - TSH + free T4 - Vitamin D (25 hydroxy) - B12 and Folate Panel - cyclobenzaprine (FLEXERIL) 10 MG tablet; Take 1 tablet (10 mg total) by mouth 3 (three) times daily as needed for muscle spasms.  Dispense: 30 tablet; Refill: 1 - fluticasone (FLONASE) 50 MCG/ACT nasal spray; SHAKE LIQUID  AND USE 2 SPRAYS IN EACH NOSTRIL TWICE DAILY  Dispense: 96 g; Refill: 1 - levothyroxine (SYNTHROID) 88 MCG tablet; TAKE 1 TABLET(88 MCG) BY MOUTH DAILY  Dispense: 90 tablet; Refill: 3 - rOPINIRole (REQUIP) 0.5 MG tablet; Take 1 tablet (0.5 mg total) by mouth as needed.  Dispense: 90 tablet; Refill: 1  2. Permanent atrial fibrillation (HCC) Routine labs ordered  - CBC with Differential/Platelet - CMP14+EGFR - Lipid Profile - Vitamin D (25 hydroxy) - B12 and Folate Panel  3. Recurrent sinusitis Cetirizine prescribed.  - cetirizine (ZYRTEC) 10 MG tablet; Take 10 mg by mouth at bedtime.  4. Acquired hypothyroidism Routine lab ordered - TSH + free T4  5. Mixed hyperlipidemia Routine labs ordered - CBC with Differential/Platelet - CMP14+EGFR - Lipid Profile - TSH + free T4 - Vitamin D (25 hydroxy) - B12 and Folate Panel  6. Vitamin D deficiency Routine lab ordered  - Vitamin D (25 hydroxy)  7. B12 deficiency Routine labs ordered  - CBC with Differential/Platelet - B12 and Folate Panel  8. Dysuria Routine urinalysis done  - UA/M w/rflx Culture, Routine - Microscopic  Examination  9. Screening for colorectal cancer Referred to GI for routine colonoscopy  - Ambulatory referral to Gastroenterology\       General Counseling: Jeffery Shaw understanding of the findings of todays visit and agrees with plan of treatment. I have discussed any further diagnostic evaluation that may be needed or ordered today. We also reviewed his medications today. he has been encouraged to call the office with any questions or concerns that should arise related to todays visit.    Orders Placed This Encounter  Procedures   Microscopic Examination   CBC with Differential/Platelet   CMP14+EGFR   Lipid Profile   TSH + free T4   Vitamin D (25 hydroxy)   B12 and Folate Panel   UA/M w/rflx Culture, Routine   Ambulatory referral to Gastroenterology    Meds ordered this encounter  Medications   cyclobenzaprine (FLEXERIL) 10 MG tablet    Sig: Take 1 tablet (10 mg total) by mouth 3 (three) times daily as needed for muscle spasms.    Dispense:  30 tablet    Refill:  1   fluticasone (FLONASE) 50 MCG/ACT nasal spray    Sig: SHAKE LIQUID AND USE 2 SPRAYS IN EACH NOSTRIL TWICE DAILY    Dispense:  96 g    Refill:  1    **Patient requests 90 days supply**   levothyroxine (SYNTHROID) 88 MCG tablet    Sig: TAKE 1 TABLET(88 MCG) BY MOUTH DAILY    Dispense:  90 tablet    Refill:  3    **Patient requests 90 days supply**   rOPINIRole (REQUIP) 0.5 MG tablet    Sig: Take 1 tablet (0.5 mg total) by mouth as needed.    Dispense:  90 tablet    Refill:  1    **Patient requests 90 days supply**    Return in about 1 year (around 04/21/2024) for CPE, Adir Schicker PCP and otherwise as needed. .   Total time spent:30 Minutes Time spent includes review of chart, medications, test results, and follow up plan with the patient.   [place] Controlled Substance Database was reviewed by me.  This patient was seen by Sallyanne Kuster, FNP-C in collaboration with Dr. Beverely Risen as a part of  collaborative care agreement.  Manaal Mandala R. Tedd Sias, MSN, FNP-C Internal medicine

## 2023-04-23 ENCOUNTER — Telehealth: Payer: Self-pay

## 2023-04-23 DIAGNOSIS — E538 Deficiency of other specified B group vitamins: Secondary | ICD-10-CM | POA: Diagnosis not present

## 2023-04-23 DIAGNOSIS — E039 Hypothyroidism, unspecified: Secondary | ICD-10-CM | POA: Diagnosis not present

## 2023-04-23 DIAGNOSIS — I4821 Permanent atrial fibrillation: Secondary | ICD-10-CM | POA: Diagnosis not present

## 2023-04-23 DIAGNOSIS — E782 Mixed hyperlipidemia: Secondary | ICD-10-CM | POA: Diagnosis not present

## 2023-04-23 DIAGNOSIS — Z0001 Encounter for general adult medical examination with abnormal findings: Secondary | ICD-10-CM | POA: Diagnosis not present

## 2023-04-23 DIAGNOSIS — E559 Vitamin D deficiency, unspecified: Secondary | ICD-10-CM | POA: Diagnosis not present

## 2023-04-23 LAB — UA/M W/RFLX CULTURE, ROUTINE
Bilirubin, UA: NEGATIVE
Glucose, UA: NEGATIVE
Leukocytes,UA: NEGATIVE
Nitrite, UA: NEGATIVE
Protein,UA: NEGATIVE
RBC, UA: NEGATIVE
Specific Gravity, UA: 1.024 (ref 1.005–1.030)
Urobilinogen, Ur: 0.2 mg/dL (ref 0.2–1.0)
pH, UA: 5 (ref 5.0–7.5)

## 2023-04-23 LAB — MICROSCOPIC EXAMINATION
Bacteria, UA: NONE SEEN
Casts: NONE SEEN /lpf
WBC, UA: NONE SEEN /hpf (ref 0–5)

## 2023-04-23 NOTE — Telephone Encounter (Signed)
PA was done and approved for Cyclobenzaprine.

## 2023-04-24 DIAGNOSIS — J301 Allergic rhinitis due to pollen: Secondary | ICD-10-CM | POA: Diagnosis not present

## 2023-04-28 ENCOUNTER — Other Ambulatory Visit: Payer: Self-pay | Admitting: Cardiovascular Disease

## 2023-04-28 ENCOUNTER — Encounter: Payer: Self-pay | Admitting: Nurse Practitioner

## 2023-04-29 ENCOUNTER — Telehealth: Payer: Self-pay | Admitting: Cardiovascular Disease

## 2023-04-29 NOTE — Telephone Encounter (Signed)
*  STAT* If patient is at the pharmacy, call can be transferred to refill team.   1. Which medications need to be refilled? (please list name of each medication and dose if known)   digoxin (LANOXIN) 0.125 MG tablet    2. Which pharmacy/location (including street and city if local pharmacy) is medication to be sent to?  [id number] - [place], [place] - [address] AT [facility] OF [address]      3. Do they need a 30 day or 90 day supply? 90

## 2023-04-29 NOTE — Telephone Encounter (Signed)
Hi,   This patient is a patient's of Dr. Kirke Corin, but is scheduled to see Lavonna Rua this Friday(05/03/2023). The patient has cancelled multiple appointments. Please advise if ok to refill medication or should refills be given at the time visit on 05/03/2023. The patient was last seen by Dr. Kirke Corin on 12-15-2021. Thank you so much.

## 2023-04-30 NOTE — Telephone Encounter (Signed)
Sending 15 day to supply pharmacy.  Short term refill given due to frequent no shows/cancellations. Patient has schedule an appointment with provider for 05/03/2023  Submitting refill in existing refill request from pharmacy.

## 2023-05-01 DIAGNOSIS — J301 Allergic rhinitis due to pollen: Secondary | ICD-10-CM | POA: Diagnosis not present

## 2023-05-02 ENCOUNTER — Telehealth: Payer: Self-pay

## 2023-05-02 LAB — B12 AND FOLATE PANEL

## 2023-05-02 NOTE — Telephone Encounter (Signed)
-----   Message from Sallyanne Kuster, NP sent at 05/02/2023 12:46 PM EDT ----- Please let patient know his results: 1. Elevated creatinine at 1.37 and decreased GFR of 54. This is decreased kidney function. I would like to repeat the lab in a couple of months to see if it resolves on its own.  2. LDL high at 132 -- limit red meat, increase lean protein in diet and add a fish oil or flaxseed oil supplement.  3. Slightly elevated fasting glucose -- we can repeat lab in a couple of months and also check A1c to rule out prediabetes or type 2 diabetes.  4. The rest of the labs are normal 5. B12 and folate labs are still pending.

## 2023-05-02 NOTE — Telephone Encounter (Signed)
Patient notified

## 2023-05-02 NOTE — Telephone Encounter (Signed)
Left voicemail for patient about is lab results and also send him a MyChart message.

## 2023-05-02 NOTE — Progress Notes (Signed)
Please let patient know his results: 1. Elevated creatinine at 1.37 and decreased GFR of 54. This is decreased kidney function. I would like to repeat the lab in a couple of months to see if it resolves on its own.  2. LDL high at 132 -- limit red meat, increase lean protein in diet and add a fish oil or flaxseed oil supplement.  3. Slightly elevated fasting glucose -- we can repeat lab in a couple of months and also check A1c to rule out prediabetes or type 2 diabetes.  4. The rest of the labs are normal 5. B12 and folate labs are still pending.

## 2023-05-03 ENCOUNTER — Encounter: Payer: Self-pay | Admitting: *Deleted

## 2023-05-03 ENCOUNTER — Ambulatory Visit: Payer: Medicare Other | Attending: Physician Assistant | Admitting: Cardiology

## 2023-05-03 ENCOUNTER — Encounter: Payer: Self-pay | Admitting: Cardiology

## 2023-05-03 ENCOUNTER — Other Ambulatory Visit
Admission: RE | Admit: 2023-05-03 | Discharge: 2023-05-03 | Disposition: A | Discharge status: Home / Self Care | Payer: Medicare Other | Source: Ambulatory Visit | Attending: Cardiology | Admitting: Cardiology

## 2023-05-03 VITALS — BP 128/82 | HR 57 | Ht 72.0 in | Wt 179.4 lb

## 2023-05-03 DIAGNOSIS — I5022 Chronic systolic (congestive) heart failure: Secondary | ICD-10-CM

## 2023-05-03 DIAGNOSIS — E039 Hypothyroidism, unspecified: Secondary | ICD-10-CM | POA: Diagnosis not present

## 2023-05-03 DIAGNOSIS — E785 Hyperlipidemia, unspecified: Secondary | ICD-10-CM | POA: Diagnosis not present

## 2023-05-03 DIAGNOSIS — R0602 Shortness of breath: Secondary | ICD-10-CM | POA: Diagnosis not present

## 2023-05-03 DIAGNOSIS — Q2112 Patent foramen ovale: Secondary | ICD-10-CM | POA: Diagnosis not present

## 2023-05-03 DIAGNOSIS — I4821 Permanent atrial fibrillation: Secondary | ICD-10-CM | POA: Insufficient documentation

## 2023-05-03 DIAGNOSIS — G4733 Obstructive sleep apnea (adult) (pediatric): Secondary | ICD-10-CM | POA: Diagnosis not present

## 2023-05-03 LAB — DIGOXIN LEVEL: Digoxin Level: 0.6 ng/mL — ABNORMAL LOW (ref 0.8–2.0)

## 2023-05-03 NOTE — Patient Instructions (Addendum)
Medication Instructions:  Your physician recommends that you continue on your current medications as directed. Please refer to the Current Medication list given to you today.  *If you need a refill on your cardiac medications before your next appointment, please call your pharmacy*  Lab Work: Your physician recommends that you get lab work: Digoxin level Sales executive at [facility] 1st desk on the right to check in (REGISTRATION)  Lab hours: Monday- Friday (7:30 am- 5:30 pm)  If you have labs (blood work) drawn today and your tests are completely normal, you will receive your results only by: MyChart Message (if you have MyChart) OR A paper copy in the mail If you have any lab test that is abnormal or we need to change your treatment, we will call you to review the results.  Testing/Procedures: Your physician has requested that you have an echocardiogram. Echocardiography is a painless test that uses sound waves to create images of your heart. It provides your doctor with information about the size and shape of your heart and how well your heart's chambers and valves are working. This procedure takes approximately one hour. There are no restrictions for this procedure. Please do NOT wear cologne, perfume, aftershave, or lotions (deodorant is allowed). Please arrive 15 minutes prior to your appointment time.    Follow-Up: At [facility], you and your health needs are our priority.  As part of our continuing mission to provide you with exceptional heart care, we have created designated Provider Care Teams.  These Care Teams include your primary Cardiologist (physician) and Advanced Practice Providers (APPs -  Physician Assistants and Nurse Practitioners) who all work together to provide you with the care you need, when you need it.  Your next appointment:   After testing is completed   Provider:   You may see Lorine Bears, MD or one of the following Advanced Practice Providers  on your designated Care Team:   Nicolasa Ducking, NP Eula Listen, PA-C Cadence Fransico Michael, PA-C Charlsie Quest, NP    Other Instructions Ambulatory referral to Pulmonology

## 2023-05-03 NOTE — Progress Notes (Signed)
Cardiology Office Note:  .   Date:  05/03/2023  ID:  Jeffery Shaw, DOB [birth year], MRN [id number] PCP: Sallyanne Kuster, NP  [facility] Providers Cardiologist:  Lorine Bears, MD    History of Present Illness: .   Jeffery Shaw is a 77 y.o. male with past medical history of permanent atrial fibrillation on apixaban with prior unsuccessful DCCV, HFrEF secondary to NICM, small PFO, hyperlipidemia, hypothyroidism, OSA on CPAP, gastroesophageal reflux disease who presents today for follow-up of NICM, atrial fibrillation, and small PFO.  TEE in 2013 revealed an EF of 30-25% with normal atrial size along with PFO with a small to moderate left atrial shunting by color Doppler.  Right/left heart catheterization in 07/2012 showed no significant CAD with a QP/QS shunt ratio of 1.37.  His most recent echocardiogram from 12/2013 showed an EF of 40-45% as detailed below.  He has been maintained on rate control strategy given prior unsuccessful DCCV, even on amiodarone.  He was last seen in clinic 12/15/2021 by Dr.Arida.  He stated been doing well from the cardiac standpoint.  Has been taking his Eliquis once daily versus twice daily by mistake.  Was sent for follow-up labs.  Echocardiogram was ordered.  Unfortunately echocardiogram was not completed.  He returns to clinic today stating that he has been doing fairly well from the cardiac standpoint.  Of course he remains in A-fib.  But it has been very well rate controlled with his current medication regimen.  He stated that he had some dietary indiscretions of high sodium foods and had some swelling and discomfort to his bilateral lower extremities. He changed his diet and stopped eating salty foods swelling had gone away overnight.  He stated that previously he had been asked if his atrial fibrillation was rate.  He was unaware of the impact.  He stated that his sister had had an A-fib ablation completed a couple years prior to [name] and still  remains out of atrial fibrillation.  Also it was noted that their father had had A-fib as well.  He denies any bleeding or noted any blood in urine or stool.  Denies any hospitalizations or visits to the emergency department.  ROS: 10 point review of systems has been completed and considered negative with exception of what is listed in the HPI  Studies Reviewed: [name]    EKG: Rate controlled atrial fibrillation, incomplete right bundle branch block, rightward axis, no acute change from prior studies  TTE 12/2013 - Left ventricle: The cavity size was normal. Wall thickness    was normal. Systolic function was mildly to moderately    reduced. The estimated ejection fraction was in the range    of 40% to 45%. Wall motion was normal; there were no    regional wall motion abnormalities. The study is not    technically sufficient to allow evaluation of LV diastolic    function.  - Aorta: Aortic root dimension: 40mm (ED).  - Aortic root: The aortic root was mildly dilated.  - Mitral valve: Mild regurgitation.  - Left atrium: The atrium was mildly dilated.  - Right ventricle: The cavity size was mildly dilated. Wall    thickness was normal.  - Right atrium: The atrium was mildly dilated.  - Atrial septum: There was a small secundum atrial septal    defect. There was a small left-to-right shunt through an    atrial septal defect.  - Pulmonary arteries: Systolic pressure was within the  normal range.  Risk Assessment/Calculations:    CHA2DS2-VASc Score = 3   This indicates a 3.2% annual risk of stroke. The patient's score is based upon: CHF History: 1 HTN History: 0 Diabetes History: 0 Stroke History: 0 Vascular Disease History: 0 Age Score: 2 Gender Score: 0            Physical Exam:   VS:  BP 128/82   Pulse (!) 57   Ht 6' (1.829 m)   Wt 179 lb 6.4 oz (81.4 kg)   SpO2 99%   BMI 24.33 kg/m    Wt Readings from Last 3 Encounters:  05/03/23 179 lb 6.4 oz (81.4 kg)  04/22/23 177  lb (80.3 kg)  04/03/22 174 lb (78.9 kg)    GEN: Well nourished, well developed in no acute distress NECK: No JVD; No carotid bruits CARDIAC: IR IR, no murmurs, rubs, gallops RESPIRATORY:  Clear to auscultation without rales, wheezing or rhonchi  ABDOMEN: Soft, non-tender, non-distended EXTREMITIES:  Trace edema; No deformity   ASSESSMENT AND PLAN: .   Permanent atrial fibrillation that is rate controlled today.  He is continued on apixaban 5 mg twice daily for CHA2DS2-VASc score of at least 3 for stroke prophylaxis.  And metoprolol to tartrate 100 mg twice daily for rate control.  He has also end-stage 0.125 mg daily.  Last July follow-up was in 1/23 he is being sent for dig level today.  Patient is also asking again today if he is a candidate for an ablation procedure, I discussed with him possible referral to EP after echocardiogram is completed.  Chronic HFrEF.  With no symptoms of overload today.  Patient appears to be euvolemic on exam.  He does state that he had swelling to his lower extremities with increased sodium intake over the last several weeks that resolved.  He is continued on metoprolol and losartan.  He has furosemide that he takes as needed but states he has not had any of this for quite some time.  Last LVEF of 40%.  Repeat echocardiogram is ordered.  Will continue to try to escalate GDMT as tolerated by blood pressure and heart rate.  If EF remains low we will repeat echocardiogram can transition losartan over to [facility] and consolidate metoprolol to tartrate to succinate.  Small PFO with reviewed echocardiogram ordered today.  Obstructive sleep apnea on CPAP.  Unfortunately provide attending previously seen who had completed his sleep study, is no longer following altered sleep with CPAP management.  He has been referred to pulmonary for continued management of altered sleep to where he can get supplies and have his machine evaluated for possible changes in settings.  Patient  states this has not been done since he received the machine originally.  Hyperlipidemia with recent LDL 132.  This continues to be managed by his PCP.  He has been advised to limit red meat, increase lean protein in his diet and his PCP has recommended adding fish oil or flaxseed oil supplement.  Hypothyroidism where he is continued on Synthroid.  This continues to be managed by his PCP.       Dispo: Patient to return to clinic to see MD/APP once echocardiogram is completed or sooner if needed.  Signed, Latash Nouri, NP

## 2023-05-04 LAB — CMP14+EGFR
ALT: 17 IU/L (ref 0–44)
AST: 20 IU/L (ref 0–40)
Albumin/Globulin Ratio: 1.7 (ref 1.2–2.2)
Albumin: 4 g/dL (ref 3.8–4.8)
Alkaline Phosphatase: 63 IU/L (ref 44–121)
BUN/Creatinine Ratio: 11 (ref 10–24)
BUN: 15 mg/dL (ref 8–27)
Bilirubin Total: 1.2 mg/dL (ref 0.0–1.2)
CO2: 27 mmol/L (ref 20–29)
Calcium: 9 mg/dL (ref 8.6–10.2)
Chloride: 99 mmol/L (ref 96–106)
Creatinine, Ser: 1.37 mg/dL — ABNORMAL HIGH (ref 0.76–1.27)
Globulin, Total: 2.4 g/dL (ref 1.5–4.5)
Glucose: 106 mg/dL — ABNORMAL HIGH (ref 70–99)
Potassium: 4.1 mmol/L (ref 3.5–5.2)
Sodium: 140 mmol/L (ref 134–144)
Total Protein: 6.4 g/dL (ref 6.0–8.5)
eGFR: 54 mL/min/{1.73_m2} — ABNORMAL LOW (ref >59)

## 2023-05-04 LAB — LIPID PANEL
Chol/HDL Ratio: 4.7 ratio (ref 0.0–5.0)
Cholesterol, Total: 196 mg/dL (ref 100–199)
HDL: 42 mg/dL (ref >39)
LDL Chol Calc (NIH): 132 mg/dL — ABNORMAL HIGH (ref 0–99)
Triglycerides: 121 mg/dL (ref 0–149)
VLDL Cholesterol Cal: 22 mg/dL (ref 5–40)

## 2023-05-04 LAB — CBC WITH DIFFERENTIAL/PLATELET
Basophils Absolute: 0.1 10*3/uL (ref 0.0–0.2)
Basos: 1 %
EOS (ABSOLUTE): 0.2 10*3/uL (ref 0.0–0.4)
Eos: 3 %
Hematocrit: 47.6 % (ref 37.5–51.0)
Hemoglobin: 16.3 g/dL (ref 13.0–17.7)
Immature Grans (Abs): 0 10*3/uL (ref 0.0–0.1)
Immature Granulocytes: 0 %
Lymphocytes Absolute: 2 10*3/uL (ref 0.7–3.1)
Lymphs: 30 %
MCH: 32.7 pg (ref 26.6–33.0)
MCHC: 34.2 g/dL (ref 31.5–35.7)
MCV: 96 fL (ref 79–97)
Monocytes Absolute: 0.9 10*3/uL (ref 0.1–0.9)
Monocytes: 14 %
Neutrophils Absolute: 3.3 10*3/uL (ref 1.4–7.0)
Neutrophils: 52 %
Platelets: 258 10*3/uL (ref 150–450)
RBC: 4.98 x10E6/uL (ref 4.14–5.80)
RDW: 13.2 % (ref 11.6–15.4)
WBC: 6.5 10*3/uL (ref 3.4–10.8)

## 2023-05-04 LAB — TSH+FREE T4
Free T4: 1.69 ng/dL (ref 0.82–1.77)
TSH: 0.774 u[IU]/mL (ref 0.450–4.500)

## 2023-05-04 LAB — VITAMIN D 25 HYDROXY (VIT D DEFICIENCY, FRACTURES): Vit D, 25-Hydroxy: 37.6 ng/mL (ref 30.0–100.0)

## 2023-05-06 ENCOUNTER — Telehealth: Payer: Self-pay | Admitting: Cardiovascular Disease

## 2023-05-06 DIAGNOSIS — I4821 Permanent atrial fibrillation: Secondary | ICD-10-CM

## 2023-05-06 NOTE — Telephone Encounter (Signed)
Left message on patient's voicemail stating the following:  "His current level is fine. He was fine without symptoms. It is better to be a little on the low side versus on the high side and becoming toxic. Recheck his level again in three months to make sure it is hanging at the same place."  Orders placed for digoxin level in 3 months

## 2023-05-06 NOTE — Telephone Encounter (Signed)
Please see below.

## 2023-05-06 NOTE — Telephone Encounter (Signed)
Further refill pending provider recommendation on digoxin lab level

## 2023-05-06 NOTE — Telephone Encounter (Signed)
Patient calling about his labs results. Please advise

## 2023-05-06 NOTE — Progress Notes (Signed)
Level a little low which is OK as he had no symptoms. Recommend rechecking level in 3 months to ensure staying around the same level or a little higher.

## 2023-05-06 NOTE — Telephone Encounter (Signed)
His current level is fine. He was fine without symptoms. It is better to be a little on the low side versus on the high side and becoming toxic. Recheck his level again in three months to make sure it is hanging at the same place.

## 2023-05-07 ENCOUNTER — Other Ambulatory Visit: Payer: Self-pay

## 2023-05-07 DIAGNOSIS — J301 Allergic rhinitis due to pollen: Secondary | ICD-10-CM | POA: Diagnosis not present

## 2023-05-07 MED ORDER — DIGOXIN 125 MCG PO TABS: 0.1250 mg | ORAL_TABLET | Freq: Every day | ORAL | 0 refills | Status: DC

## 2023-05-07 NOTE — Telephone Encounter (Signed)
See 05/06/23 provider recommendation:  His current level is fine. He was fine without symptoms. It is better to be a little on the low side versus on the high side and becoming toxic. Recheck his level again in three months to make sure it is hanging at the same place. His current level is fine. He was fine without symptoms. It is better to be a little on the low side versus on the high side and becoming toxic. Recheck his level again in three months to make sure it is hanging at the same place.

## 2023-05-07 NOTE — Telephone Encounter (Signed)
Requested Prescriptions   Signed Prescriptions Disp Refills   digoxin (LANOXIN) 0.125 MG tablet 30 tablet 0    Sig: Take 1 tablet (0.125 mg total) by mouth daily. MUST KEEP 05/03/23 APPOINTMENT PRIOR TO FURTHER REFILLS    Authorizing Provider: Lorine Bears A    Ordering User: Guerry Minors

## 2023-05-08 ENCOUNTER — Other Ambulatory Visit: Payer: Self-pay | Admitting: *Deleted

## 2023-05-08 DIAGNOSIS — J301 Allergic rhinitis due to pollen: Secondary | ICD-10-CM | POA: Diagnosis not present

## 2023-05-08 DIAGNOSIS — I5022 Chronic systolic (congestive) heart failure: Secondary | ICD-10-CM

## 2023-05-08 DIAGNOSIS — I4821 Permanent atrial fibrillation: Secondary | ICD-10-CM

## 2023-05-13 ENCOUNTER — Other Ambulatory Visit: Payer: Self-pay | Admitting: Cardiovascular Disease

## 2023-05-13 DIAGNOSIS — I4821 Permanent atrial fibrillation: Secondary | ICD-10-CM

## 2023-05-13 MED ORDER — APIXABAN 5 MG PO TABS
ORAL_TABLET | ORAL | 1 refills | Status: DC
Start: 2023-05-13 — End: 2024-04-01

## 2023-05-13 MED ORDER — METOPROLOL TARTRATE 100 MG PO TABS: 100.0000 mg | ORAL_TABLET | Freq: Two times a day (BID) | ORAL | 3 refills | Status: DC

## 2023-05-13 MED ORDER — LOSARTAN POTASSIUM 25 MG PO TABS: ORAL_TABLET | ORAL | 3 refills | Status: DC

## 2023-05-13 MED ORDER — DIGOXIN 125 MCG PO TABS: 0.1250 mg | ORAL_TABLET | Freq: Every day | ORAL | 3 refills | Status: DC

## 2023-05-13 NOTE — Telephone Encounter (Signed)
Please review for Eliquis refill.   

## 2023-05-13 NOTE — Telephone Encounter (Signed)
*  STAT* If patient is at the pharmacy, call can be transferred to refill team.   1. Which medications need to be refilled? (please list name of each medication and dose if known) apixaban (ELIQUIS) 5 MG TABS tablet digoxin (LANOXIN) 0.125 MG tablet losartan (COZAAR) 25 MG  metoprolol tartrate (LOPRESSOR) 100 MG tablet  2. Which pharmacy/location (including street and city if local pharmacy) is medication to be sent to? [id number] - [place], [place] - [address] AT [facility] OF [address]  3. Do they need a 30 day or 90 day supply?  90 day supply

## 2023-05-13 NOTE — Telephone Encounter (Addendum)
Eliquis 5mg  refill request received. Patient is 77 years old, weight-81.4kg, Crea-1.37 on 04/23/23, Diagnosis-Afib, and last seen by Charlsie Quest, NP on 05/03/23. Dose is appropriate based on dosing criteria. Will send in refill to requested pharmacy.    Removed previous sample info and sent and it was not visible but after sent it was still present but the correct amount was sent to pharmacy.

## 2023-05-15 DIAGNOSIS — F410 Panic disorder [episodic paroxysmal anxiety] without agoraphobia: Secondary | ICD-10-CM | POA: Diagnosis not present

## 2023-05-15 DIAGNOSIS — F1011 Alcohol abuse, in remission: Secondary | ICD-10-CM | POA: Diagnosis not present

## 2023-05-15 DIAGNOSIS — J301 Allergic rhinitis due to pollen: Secondary | ICD-10-CM | POA: Diagnosis not present

## 2023-05-15 DIAGNOSIS — F39 Unspecified mood [affective] disorder: Secondary | ICD-10-CM | POA: Diagnosis not present

## 2023-05-15 DIAGNOSIS — F411 Generalized anxiety disorder: Secondary | ICD-10-CM | POA: Diagnosis not present

## 2023-05-22 DIAGNOSIS — J301 Allergic rhinitis due to pollen: Secondary | ICD-10-CM | POA: Diagnosis not present

## 2023-05-27 ENCOUNTER — Telehealth: Payer: Self-pay

## 2023-05-27 NOTE — Telephone Encounter (Signed)
Lm for patient to ask if he has had previous sleep study.  ?

## 2023-05-28 NOTE — Telephone Encounter (Signed)
Lm x2 for patient.  Will close encounter per office protocol.   

## 2023-05-29 ENCOUNTER — Encounter: Payer: Self-pay | Admitting: Nurse Practitioner

## 2023-05-29 ENCOUNTER — Ambulatory Visit (INDEPENDENT_AMBULATORY_CARE_PROVIDER_SITE_OTHER): Payer: Medicare Other | Admitting: Nurse Practitioner

## 2023-05-29 VITALS — BP 122/80 | HR 65 | Temp 97.9°F | Ht 72.0 in | Wt 176.0 lb

## 2023-05-29 DIAGNOSIS — I4821 Permanent atrial fibrillation: Secondary | ICD-10-CM

## 2023-05-29 DIAGNOSIS — G4719 Other hypersomnia: Secondary | ICD-10-CM | POA: Diagnosis not present

## 2023-05-29 DIAGNOSIS — J301 Allergic rhinitis due to pollen: Secondary | ICD-10-CM | POA: Diagnosis not present

## 2023-05-29 DIAGNOSIS — F5104 Psychophysiologic insomnia: Secondary | ICD-10-CM | POA: Diagnosis not present

## 2023-05-29 DIAGNOSIS — G4733 Obstructive sleep apnea (adult) (pediatric): Secondary | ICD-10-CM | POA: Diagnosis not present

## 2023-05-29 NOTE — Assessment & Plan Note (Signed)
History of OSA. Difficulties tolerating CPAP. Given length of time since his last sleep study, he will need repeat HST to reassess status of sleep disordered breathing. He declined in lab study at this time. We did discuss that dependent on results and if there are future difficulties with CPAP should he resume therapy, he may need in lab titration which he was agreeable to. Reviewed risks of untreated OSA and potential treatment options. Could consider alternatives including oral appliance or hypoglossal nerve stimulator. Aware of safe driving practices.  Patient Instructions  Given your symptoms and history, I am concerned that you still have sleep disordered breathing with sleep apnea. You will need a repeat sleep study for further evaluation. Someone will contact you to schedule this.   We discussed how untreated sleep apnea puts an individual at risk for cardiac arrhthymias, pulm HTN, DM, stroke and increases their risk for daytime accidents. We also briefly reviewed treatment options including weight loss, side sleeping position, oral appliance, CPAP therapy or referral to ENT for possible surgical options  Use caution when driving and pull over if you become sleepy.  After we get your sleep study results back, I will send you to a local medical supply company to adjust your CPAP and change your mask out if you do still have sleep apnea.   Follow up in 8-10 weeks with Katie Jaquanna Ballentine,NP, or sooner, if needed

## 2023-05-29 NOTE — Assessment & Plan Note (Signed)
Rate controlled. On AC. Follow up with cardiology as scheduled.  

## 2023-05-29 NOTE — Patient Instructions (Addendum)
Given your symptoms and history, I am concerned that you still have sleep disordered breathing with sleep apnea. You will need a repeat sleep study for further evaluation. Someone will contact you to schedule this.   We discussed how untreated sleep apnea puts an individual at risk for cardiac arrhthymias, pulm HTN, DM, stroke and increases their risk for daytime accidents. We also briefly reviewed treatment options including weight loss, side sleeping position, oral appliance, CPAP therapy or referral to ENT for possible surgical options  Use caution when driving and pull over if you become sleepy.  After we get your sleep study results back, I will send you to a local medical supply company to adjust your CPAP and change your mask out if you do still have sleep apnea.   Follow up in 8-10 weeks with Katie Emon Miggins,NP, or sooner, if needed

## 2023-05-29 NOTE — Assessment & Plan Note (Signed)
Possibly due to untreated OSA. He is on Lunesta 1 mg prescribed by his PCP. Sleep hygiene reviewed. Hold on making further adjustments to medications until we reassess status of his OSA.

## 2023-05-29 NOTE — Progress Notes (Signed)
@Patient  ID: Jeffery Shaw, male    DOB: 10-Feb-1946, 77 y.o.   MRN: [id number]  Chief Complaint  Patient presents with   sleep consult    Not wearing cpap. Feels that pressure is too strong.      Referring provider: Charlsie Quest, NP  HPI: 77 year old male, former smoker referred for sleep consult. Past medical history significant for a fib on Eliquis, CHF, PFO, hypothyroid, RLS, OSA.   TEST/EVENTS:   05/29/2023: Today - sleep consult Patient presents today for sleep consult. He was diagnosed with sleep apnea a few years ago. He bought a CPAP from a PA in [place] and has been trying to use this but has never been able to get used to it. He feels like it just blows and blows. Leaks a lot out of the sides of his mask. He's tired during the day. Wakes up groggy. Wakes up with a very dry mouth. He does snore loudly and his wife has told him that he stops breathing when he sleeps. He denies any morning headaches, drowsy driving, sleep parasomnias/paralysis. No history of narcolepsy or symptoms of cataplexy.  He tries to get 7-8 hours of sleep but has difficulties falling asleep and staying asleep. He does take 1 mg of Lunesta nightly, which helps with sleep latency but still doesn't seem to keep him asleep. He does not have any worsening grogginess in the AM from this. Weight is stable. No oxygen use. Last sleep study was years ago at [facility] but he does not have the old records. Current CPAP settings at 5-9 cmH2O with full face mask. Hasn't used it in quite some time.  He has 3 cups of coffee or tea a day. He does not drink alcohol. Lives with his wife. He is retired.   Ep[place] 1  No Known Allergies  Immunization History  Administered Date(s) Administered   Influenza, High Dose Seasonal PF 08/14/2018, 08/15/2019   Influenza,inj,Quad PF,6+ Mos 08/31/2019   Influenza-Unspecified 08/31/2017, 09/07/2020   PFIZER(Purple Top)SARS-COV-2 Vaccination 01/15/2020, 02/14/2020    Past  Medical History:  Diagnosis Date   Asthma    Cataract    both eyes   Chronic atrial fibrillation (HCC)    a. Rate-controlled; b. CHA2DS2VASc = 2--> Eliquis.   Chronic systolic heart failure (HCC)    a. 07/2012 TEE: EF 30-35%; b. 07/2012 Cath: no significant coronary artery disease with an ejection fraction of 35%. Normal filling pressures with only mild PAH; c. 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 Nl PASP.   Emphysema, unspecified (HCC)    GERD (gastroesophageal reflux disease)    Hyperlipidemia    Hypothyroidism    Mildly dilated aortic root (HCC)    a. 12/2013 Echo: Ao root 40mm.   NICM (nonischemic cardiomyopathy) (HCC)    a. 07/2012 TEE: EF 30-35%; b. 07/2012 Cath: nonobs dzs, EF 35%; c. 12/2013 Echo: EF 40-45%.   Palpitations    Pancreatitis 1980   due to ETOH   Panic attacks    PFO (patent foramen ovale)    a. 2013 TEE & Cath: Tunnelled PFO with a small left-to-right atrial shunt noted on TEE with a QP/QS ratio of 1.37 by cardiac cath; b. 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   Sleep apnea    CPAP    Tobacco History: Social History   Tobacco Use  Smoking Status Former  Packs/day: 0.50   Years: 20.00   Additional pack years: 0.00   Total pack years: 10.00   Types: Cigarettes   Quit date: 08/07/2008   Years since quitting: 14.8  Smokeless Tobacco Never   Counseling given: Not Answered   Outpatient Medications Prior to Visit  Medication Sig Dispense Refill   albuterol (VENTOLIN HFA) 108 (90 Base) MCG/ACT inhaler Inhale 2 puffs into the lungs every 6 (six) hours as needed for wheezing or shortness of breath. 8 g 5   apixaban (ELIQUIS) 5 MG TABS tablet TAKE 1 TABLET(5 MG) BY MOUTH TWICE DAILY 180 tablet 1   buPROPion (WELLBUTRIN SR) 100 MG 12 hr tablet Take 100 mg by mouth daily.     cetirizine (ZYRTEC) 10 MG tablet Take 10 mg by mouth at bedtime.     cyclobenzaprine (FLEXERIL) 10  MG tablet Take 1 tablet (10 mg total) by mouth 3 (three) times daily as needed for muscle spasms. 30 tablet 1   digoxin (LANOXIN) 0.125 MG tablet Take 1 tablet (0.125 mg total) by mouth daily. MUST KEEP 05/03/23 APPOINTMENT PRIOR TO FURTHER REFILLS 90 tablet 3   eszopiclone (LUNESTA) 1 MG TABS tablet Take 1 mg by mouth at bedtime as needed for sleep. Take immediately before bedtime     fluticasone (FLONASE) 50 MCG/ACT nasal spray SHAKE LIQUID AND USE 2 SPRAYS IN EACH NOSTRIL TWICE DAILY 96 g 1   furosemide (LASIX) 20 MG tablet Take 1 tablet (20 mg total) by mouth daily as needed. 90 tablet 2   levothyroxine (SYNTHROID) 88 MCG tablet TAKE 1 TABLET(88 MCG) BY MOUTH DAILY 90 tablet 3   losartan (COZAAR) 25 MG tablet TAKE 1 TABLET BY MOUTH EVERY DAY, NEED OFFICE VISIT FOR REFILLS 90 tablet 3   metoprolol tartrate (LOPRESSOR) 100 MG tablet Take 1 tablet (100 mg total) by mouth 2 (two) times daily. 180 tablet 3   Oxcarbazepine (TRILEPTAL) 300 MG tablet Take 300 mg by mouth daily.     rOPINIRole (REQUIP) 0.5 MG tablet Take 1 tablet (0.5 mg total) by mouth as needed. 90 tablet 1   sertraline (ZOLOFT) 100 MG tablet Take 100 mg by mouth daily. As per pt her taking 1/2 tablet     VITAMIN D PO Take by mouth daily.     No facility-administered medications prior to visit.     Review of Systems:   Constitutional: No weight loss or gain, night sweats, fevers, chills, or lassitude. +daytime fatigue  HEENT: No headaches, difficulty swallowing, tooth/dental problems, or sore throat. No sneezing, itching, ear ache, nasal congestion, or post nasal drip CV:  +palpitations (chronic a fib). No chest pain, orthopnea, PND, swelling in lower extremities, anasarca, dizziness, syncope Resp: +snoring, witnessed apneas. No shortness of breath with exertion or at rest. No excess mucus or change in color of mucus. No productive or non-productive. No hemoptysis. No wheezing.  No chest wall deformity GI:  No heartburn,  indigestion GU: No dysuria, change in color of urine, urgency or frequency.  Skin: No rash, lesions, ulcerations MSK:  No joint pain or swelling.   Neuro: No dizziness or lightheadedness.  Psych: No depression or anxiety. Mood stable. +sleep disturbance    Physical Exam:  BP 122/80 (BP Location: Left Arm, Cuff Size: Normal)   Pulse 65   Temp 97.9 F (36.6 C) (Temporal)   Ht 6' (1.829 m)   Wt 176 lb (79.8 kg)   SpO2 96%   BMI 23.87 kg/m   GEN: Pleasant, interactive, well-appearing;  in no acute distress HEENT:  Normocephalic and atraumatic. PERRLA. Sclera white. Nasal turbinates pink, moist and patent bilaterally. No rhinorrhea present. Oropharynx pink and moist, without exudate or edema. No lesions, ulcerations, or postnasal drip. Mallampati III NECK:  Supple w/ fair ROM. No JVD present. Normal carotid impulses w/o bruits. Thyroid symmetrical with no goiter or nodules palpated. No lymphadenopathy.   CV: Irregular rhythm, rate controlled, no m/r/g, no peripheral edema. Pulses intact, +2 bilaterally. No cyanosis, pallor or clubbing. PULMONARY:  Unlabored, regular breathing. Clear bilaterally A&P w/o wheezes/rales/rhonchi. No accessory muscle use.  GI: BS present and normoactive. Soft, non-tender to palpation. No organomegaly or masses detected.  MSK: No erythema, warmth or tenderness. Cap refil <2 sec all extrem. No deformities or joint swelling noted.  Neuro: A/Ox3. No focal deficits noted.   Skin: Warm, no lesions or rashe Psych: Normal affect and behavior. Judgement and thought content appropriate.     Lab Results:  CBC    Component Value Date/Time   WBC 6.5 04/23/2023 0945   WBC 9.8 12/15/2021 1101   RBC 4.98 04/23/2023 0945   RBC 4.92 12/15/2021 1101   HGB 16.3 04/23/2023 0945   HCT 47.6 04/23/2023 0945   PLT 258 04/23/2023 0945   MCV 96 04/23/2023 0945   MCH 32.7 04/23/2023 0945   MCH 33.1 12/15/2021 1101   MCHC 34.2 04/23/2023 0945   MCHC 35.1 12/15/2021 1101    RDW 13.2 04/23/2023 0945   LYMPHSABS 2.0 04/23/2023 0945   MONOABS 1.0 12/15/2021 1101   EOSABS 0.2 04/23/2023 0945   BASOSABS 0.1 04/23/2023 0945    BMET    Component Value Date/Time   NA 140 04/23/2023 0945   K 4.1 04/23/2023 0945   CL 99 04/23/2023 0945   CO2 27 04/23/2023 0945   GLUCOSE 106 (H) 04/23/2023 0945   GLUCOSE 119 (H) 12/15/2021 1101   BUN 15 04/23/2023 0945   CREATININE 1.37 (H) 04/23/2023 0945   CALCIUM 9.0 04/23/2023 0945   GFRNONAA >60 12/15/2021 1101   GFRAA 69 04/13/2020 1543    BNP No results found for: "BNP"   Imaging:  No results found.        No data to display          No results found for: "NITRICOXIDE"      Assessment & Plan:   Obstructive sleep apnea History of OSA. Difficulties tolerating CPAP. Given length of time since his last sleep study, he will need repeat HST to reassess status of sleep disordered breathing. He declined in lab study at this time. We did discuss that dependent on results and if there are future difficulties with CPAP should he resume therapy, he may need in lab titration which he was agreeable to. Reviewed risks of untreated OSA and potential treatment options. Could consider alternatives including oral appliance or hypoglossal nerve stimulator. Aware of safe driving practices.  Patient Instructions  Given your symptoms and history, I am concerned that you still have sleep disordered breathing with sleep apnea. You will need a repeat sleep study for further evaluation. Someone will contact you to schedule this.   We discussed how untreated sleep apnea puts an individual at risk for cardiac arrhthymias, pulm HTN, DM, stroke and increases their risk for daytime accidents. We also briefly reviewed treatment options including weight loss, side sleeping position, oral appliance, CPAP therapy or referral to ENT for possible surgical options  Use caution when driving and pull over if you become sleepy.  After we  get your sleep study results back, I will send you to a local medical supply company to adjust your CPAP and change your mask out if you do still have sleep apnea.   Follow up in 8-10 weeks with Katie Suhaila Troiano,NP, or sooner, if needed    Chronic insomnia Possibly due to untreated OSA. He is on Lunesta 1 mg prescribed by his PCP. Sleep hygiene reviewed. Hold on making further adjustments to medications until we reassess status of his OSA.   Atrial fibrillation Rate controlled. On AC. Follow up with cardiology as scheduled.    I spent 35 minutes of dedicated to the care of this patient on the date of this encounter to include pre-visit review of records, face-to-face time with the patient discussing conditions above, post visit ordering of testing, clinical documentation with the electronic health record, making appropriate referrals as documented, and communicating necessary findings to members of the patients care team.  Noemi Chapel, NP 05/29/2023  Pt aware and understands NP's role.

## 2023-05-30 ENCOUNTER — Telehealth: Payer: Self-pay | Admitting: Nurse Practitioner

## 2023-05-30 NOTE — Telephone Encounter (Signed)
Left vm and sent message for patient following up on GI referral sent out 04/22/23-Jeffery Shaw

## 2023-05-31 NOTE — Progress Notes (Signed)
Reviewed and agree with assessment/plan.   Coralyn Helling, MD [facility] Pulmonary/Critical Care 05/31/2023, 8:52 AM Pager:  [phone]

## 2023-06-05 DIAGNOSIS — J301 Allergic rhinitis due to pollen: Secondary | ICD-10-CM | POA: Diagnosis not present

## 2023-06-11 ENCOUNTER — Ambulatory Visit: Payer: Medicare Other | Attending: Cardiology

## 2023-06-12 DIAGNOSIS — J301 Allergic rhinitis due to pollen: Secondary | ICD-10-CM | POA: Diagnosis not present

## 2023-06-13 ENCOUNTER — Telehealth: Payer: Self-pay | Admitting: *Deleted

## 2023-06-13 NOTE — Telephone Encounter (Signed)
Spoke with patient and inquired if he would like to reschedule his echocardiogram due to not showing up for the one he had scheduled. He reports that he sent message cancelling that appointment. He requested to call back once he talks with his family. Leaving scheduled appointment for now until he calls back.

## 2023-06-13 NOTE — Telephone Encounter (Signed)
-----   Message from [facility] sent at 06/13/2023  8:48 AM EDT ----- Pt has f/u 7/30 with Sheri. Pt f/u echo. Pt n/s Echo and never rescheduled. Please advise if ok to keep appointment.

## 2023-06-18 ENCOUNTER — Telehealth: Payer: Self-pay

## 2023-06-18 ENCOUNTER — Ambulatory Visit: Payer: Medicare Other | Admitting: Cardiology

## 2023-06-18 NOTE — Telephone Encounter (Signed)
Left VM advising patient that we need to reschedule his appointment for today.  Please advise patient that appointment today needs to be canceled since his echocardiogram was not completed prior to this appointment. Patient needs to reschedule his echo and then schedule a f/u appointment to review results.

## 2023-06-19 DIAGNOSIS — J301 Allergic rhinitis due to pollen: Secondary | ICD-10-CM | POA: Diagnosis not present

## 2023-06-21 ENCOUNTER — Telehealth: Payer: Self-pay | Admitting: Nurse Practitioner

## 2023-06-21 NOTE — Telephone Encounter (Signed)
Left vm and sent message for patient following up on GI referral sent out 04/22/23-Toni

## 2023-06-26 DIAGNOSIS — J301 Allergic rhinitis due to pollen: Secondary | ICD-10-CM | POA: Diagnosis not present

## 2023-07-01 ENCOUNTER — Encounter: Payer: Self-pay | Admitting: *Deleted

## 2023-07-03 DIAGNOSIS — J301 Allergic rhinitis due to pollen: Secondary | ICD-10-CM | POA: Diagnosis not present

## 2023-07-10 DIAGNOSIS — J301 Allergic rhinitis due to pollen: Secondary | ICD-10-CM | POA: Diagnosis not present

## 2023-07-17 DIAGNOSIS — J301 Allergic rhinitis due to pollen: Secondary | ICD-10-CM | POA: Diagnosis not present

## 2023-07-18 ENCOUNTER — Ambulatory Visit: Payer: Medicare Other | Admitting: Nurse Practitioner

## 2023-07-24 DIAGNOSIS — J301 Allergic rhinitis due to pollen: Secondary | ICD-10-CM | POA: Diagnosis not present

## 2023-07-25 ENCOUNTER — Encounter: Payer: Self-pay | Admitting: Cardiology

## 2023-07-25 DIAGNOSIS — J301 Allergic rhinitis due to pollen: Secondary | ICD-10-CM | POA: Diagnosis not present

## 2023-07-30 ENCOUNTER — Telehealth: Payer: Self-pay

## 2023-07-30 NOTE — Telephone Encounter (Signed)
Patient called stating that this past Saturday he got two vaccine and now he is not feeling the best and running a low grade fever only in the afternoons. Patient was advised to take a covid test and call office back.

## 2023-07-31 ENCOUNTER — Ambulatory Visit
Admission: RE | Admit: 2023-07-31 | Discharge: 2023-07-31 | Disposition: A | Discharge status: Home / Self Care | Payer: Medicare Other | Source: Ambulatory Visit | Attending: Student

## 2023-07-31 ENCOUNTER — Other Ambulatory Visit: Payer: Self-pay | Admitting: Student

## 2023-07-31 DIAGNOSIS — R509 Fever, unspecified: Secondary | ICD-10-CM | POA: Diagnosis not present

## 2023-07-31 DIAGNOSIS — R051 Acute cough: Secondary | ICD-10-CM | POA: Diagnosis not present

## 2023-07-31 DIAGNOSIS — R059 Cough, unspecified: Secondary | ICD-10-CM | POA: Diagnosis not present

## 2023-08-01 ENCOUNTER — Telehealth: Payer: Medicare Other | Admitting: Nurse Practitioner

## 2023-08-07 ENCOUNTER — Other Ambulatory Visit: Payer: Self-pay

## 2023-08-07 DIAGNOSIS — J329 Chronic sinusitis, unspecified: Secondary | ICD-10-CM

## 2023-08-07 DIAGNOSIS — J301 Allergic rhinitis due to pollen: Secondary | ICD-10-CM | POA: Diagnosis not present

## 2023-08-07 MED ORDER — ALBUTEROL SULFATE HFA 108 (90 BASE) MCG/ACT IN AERS
2.0000 | INHALATION_SPRAY | Freq: Four times a day (QID) | RESPIRATORY_TRACT | 5 refills | Status: AC | PRN
Start: 2023-08-07 — End: ?

## 2023-08-07 NOTE — Telephone Encounter (Signed)
Pt called need refills for albuterol sent

## 2023-08-09 DIAGNOSIS — F410 Panic disorder [episodic paroxysmal anxiety] without agoraphobia: Secondary | ICD-10-CM | POA: Diagnosis not present

## 2023-08-09 DIAGNOSIS — F1011 Alcohol abuse, in remission: Secondary | ICD-10-CM | POA: Diagnosis not present

## 2023-08-09 DIAGNOSIS — F411 Generalized anxiety disorder: Secondary | ICD-10-CM | POA: Diagnosis not present

## 2023-08-09 DIAGNOSIS — F39 Unspecified mood [affective] disorder: Secondary | ICD-10-CM | POA: Diagnosis not present

## 2023-08-12 ENCOUNTER — Telehealth (INDEPENDENT_AMBULATORY_CARE_PROVIDER_SITE_OTHER): Payer: Medicare Other | Admitting: Nurse Practitioner

## 2023-08-12 ENCOUNTER — Encounter: Payer: Self-pay | Admitting: Nurse Practitioner

## 2023-08-12 ENCOUNTER — Telehealth: Payer: Self-pay

## 2023-08-12 VITALS — Resp 16 | Ht 72.0 in | Wt 175.0 lb

## 2023-08-12 DIAGNOSIS — B9689 Other specified bacterial agents as the cause of diseases classified elsewhere: Secondary | ICD-10-CM | POA: Diagnosis not present

## 2023-08-12 DIAGNOSIS — J208 Acute bronchitis due to other specified organisms: Secondary | ICD-10-CM

## 2023-08-12 MED ORDER — PREDNISONE 10 MG (21) PO TBPK
ORAL_TABLET | ORAL | 0 refills | Status: DC
Start: 2023-08-12 — End: 2023-08-27

## 2023-08-12 MED ORDER — AMOXICILLIN-POT CLAVULANATE 875-125 MG PO TABS
1.0000 | ORAL_TABLET | Freq: Two times a day (BID) | ORAL | 0 refills | Status: DC
Start: 2023-08-12 — End: 2024-04-01

## 2023-08-12 NOTE — Progress Notes (Signed)
[facility] [address] [place], [place] [address]  Internal MEDICINE  Telephone Visit  Patient Name: Jeffery Shaw  [id number]  [id number]  Date of Service: 08/12/2023  I connected with the patient at 1245 by telephone and verified the patients identity using two identifiers.   I discussed the limitations, risks, security and privacy concerns of performing an evaluation and management service by telephone and the availability of in person appointments. I also discussed with the patient that there may be a patient responsible charge related to the service.  The patient expressed understanding and agrees to proceed.    Chief Complaint  Patient presents with   Telephone Screen    Fever usually comes in the afternoons, no other symptoms. Jenne Campus ran x-ray and using inhaler, thinking maybe a lung infection.  Coughing a little bit of yellow stuff up.    Telephone Assessment    HPI Georg presents for a telehealth virtual visit for  Took 2 vaccines together Using inhaler    Current Medication: Outpatient Encounter Medications as of 08/12/2023  Medication Sig   albuterol (VENTOLIN HFA) 108 (90 Base) MCG/ACT inhaler Inhale 2 puffs into the lungs every 6 (six) hours as needed for wheezing or shortness of breath.   amoxicillin-clavulanate (AUGMENTIN) 875-125 MG tablet Take 1 tablet by mouth 2 (two) times daily.   apixaban (ELIQUIS) 5 MG TABS tablet TAKE 1 TABLET(5 MG) BY MOUTH TWICE DAILY   buPROPion (WELLBUTRIN SR) 100 MG 12 hr tablet Take 100 mg by mouth daily.   cetirizine (ZYRTEC) 10 MG tablet Take 10 mg by mouth at bedtime.   cyclobenzaprine (FLEXERIL) 10 MG tablet Take 1 tablet (10 mg total) by mouth 3 (three) times daily as needed for muscle spasms.   digoxin (LANOXIN) 0.125 MG tablet Take 1 tablet (0.125 mg total) by mouth daily. MUST KEEP 05/03/23 APPOINTMENT PRIOR TO FURTHER REFILLS   eszopiclone (LUNESTA) 1 MG TABS tablet Take 1 mg by mouth at bedtime as needed for  sleep. Take immediately before bedtime   fluticasone (FLONASE) 50 MCG/ACT nasal spray SHAKE LIQUID AND USE 2 SPRAYS IN EACH NOSTRIL TWICE DAILY   furosemide (LASIX) 20 MG tablet Take 1 tablet (20 mg total) by mouth daily as needed.   levothyroxine (SYNTHROID) 88 MCG tablet TAKE 1 TABLET(88 MCG) BY MOUTH DAILY   losartan (COZAAR) 25 MG tablet TAKE 1 TABLET BY MOUTH EVERY DAY, NEED OFFICE VISIT FOR REFILLS   metoprolol tartrate (LOPRESSOR) 100 MG tablet Take 1 tablet (100 mg total) by mouth 2 (two) times daily.   Oxcarbazepine (TRILEPTAL) 300 MG tablet Take 300 mg by mouth daily.   predniSONE (STERAPRED UNI-PAK 21 TAB) 10 MG (21) TBPK tablet Use as directed for 6 days   rOPINIRole (REQUIP) 0.5 MG tablet Take 1 tablet (0.5 mg total) by mouth as needed.   sertraline (ZOLOFT) 100 MG tablet Take 100 mg by mouth daily. As per pt her taking 1/2 tablet   VITAMIN D PO Take by mouth daily.   No facility-administered encounter medications on file as of 08/12/2023.    Surgical History: Past Surgical History:  Procedure Laterality Date   CARDIAC CATHETERIZATION  07/2012   [facility]   CARDIOVERSION  09/15/12   CATARACT EXTRACTION W/PHACO Left 04/17/2016   Procedure: CATARACT EXTRACTION PHACO AND INTRAOCULAR LENS PLACEMENT (IOC);  Surgeon: Galen Manila, MD;  Location: [facility] ORS;  Service: Ophthalmology;  Laterality: Left;  [place] 32.9 AP% 20.1 CDE 6.59 FLUID PACK LOT # [id number]   CATARACT  EXTRACTION W/PHACO Right 05/21/2016   Procedure: CATARACT EXTRACTION PHACO AND INTRAOCULAR LENS PLACEMENT (IOC);  Surgeon: Galen Manila, MD;  Location: [facility] ORS;  Service: Ophthalmology;  Laterality: Right;  [place] 31.0 AP% 20.4 CDE 6.30 Fluid Pck Lot # [id number]   CYSTOSCOPY W/ URETEROSCOPY     TEE WITHOUT CARDIOVERSION      Medical History: Past Medical History:  Diagnosis Date   Asthma    Cataract    both eyes   Chronic atrial fibrillation (HCC)    a. Rate-controlled; b. CHA2DS2VASc = 2--> Eliquis.   Chronic  systolic heart failure (HCC)    a. 07/2012 TEE: EF 30-35%; b. 07/2012 Cath: no significant coronary artery disease with an ejection fraction of 35%. Normal filling pressures with only mild PAH; c. 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 Nl PASP.   Emphysema, unspecified (HCC)    GERD (gastroesophageal reflux disease)    Hyperlipidemia    Hypothyroidism    Mildly dilated aortic root (HCC)    a. 12/2013 Echo: Ao root 40mm.   NICM (nonischemic cardiomyopathy) (HCC)    a. 07/2012 TEE: EF 30-35%; b. 07/2012 Cath: nonobs dzs, EF 35%; c. 12/2013 Echo: EF 40-45%.   Palpitations    Pancreatitis 1980   due to ETOH   Panic attacks    PFO (patent foramen ovale)    a. 2013 TEE & Cath: Tunnelled PFO with a small left-to-right atrial shunt noted on TEE with a QP/QS ratio of 1.37 by cardiac cath; b. 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   Sleep apnea    CPAP    Family History: Family History  Problem Relation Age of Onset   Arrhythmia Sister        A-fib/stroke    Arrhythmia Sister    Hyperlipidemia Brother     Social History   Socioeconomic History   Marital status: Married    Spouse name: Not on file   Number of children: Not on file   Years of education: Not on file   Highest education level: Not on file  Occupational History   Not on file  Tobacco Use   Smoking status: Former    Current packs/day: 0.00    Average packs/day: 0.5 packs/day for 20.0 years (10.0 ttl pk-yrs)    Types: Cigarettes    Start date: 08/07/1988    Quit date: 08/07/2008    Years since quitting: 15.0   Smokeless tobacco: Never  Vaping Use   Vaping status: Never Used  Substance and Sexual Activity   Alcohol use: No   Drug use: No   Sexual activity: Not on file  Other Topics Concern   Not on file  Social History Narrative   Not on file   Social Determinants of Health   Financial Resource Strain: Not on file  Food  Insecurity: Not on file  Transportation Needs: Not on file  Physical Activity: Not on file  Stress: Not on file  Social Connections: Not on file  Intimate Partner Violence: Not on file      Review of Systems  Constitutional:  Positive for fatigue and fever. Negative for chills.  HENT:  Positive for congestion, postnasal drip, rhinorrhea and sore throat. Negative for sinus pressure and sinus pain.   Respiratory:  Positive for cough and shortness of breath. Negative for chest tightness and wheezing.   Cardiovascular: Negative.  Negative for chest pain and  palpitations.  Neurological:  Negative for headaches.    Vital Signs: Resp 16   Ht 6' (1.829 m)   Wt 175 lb (79.4 kg)   BMI 23.73 kg/m    Observation/Objective: He is alert and oriented. No acute distress noted.    Assessment/Plan: 1. Acute bacterial bronchitis Augmentin and prednisone taper prescribed. Take until gone  - amoxicillin-clavulanate (AUGMENTIN) 875-125 MG tablet; Take 1 tablet by mouth 2 (two) times daily.  Dispense: 20 tablet; Refill: 0 - predniSONE (STERAPRED UNI-PAK 21 TAB) 10 MG (21) TBPK tablet; Use as directed for 6 days  Dispense: 21 tablet; Refill: 0   General Counseling: yul geddie understanding of the findings of today's phone visit and agrees with plan of treatment. I have discussed any further diagnostic evaluation that may be needed or ordered today. We also reviewed his medications today. he has been encouraged to call the office with any questions or concerns that should arise related to todays visit.  Return if symptoms worsen or fail to improve.   No orders of the defined types were placed in this encounter.   Meds ordered this encounter  Medications   amoxicillin-clavulanate (AUGMENTIN) 875-125 MG tablet    Sig: Take 1 tablet by mouth 2 (two) times daily.    Dispense:  20 tablet    Refill:  0   predniSONE (STERAPRED UNI-PAK 21 TAB) 10 MG (21) TBPK tablet    Sig: Use as directed  for 6 days    Dispense:  21 tablet    Refill:  0    Time spent:10 Minutes Time spent with patient included reviewing progress notes, labs, imaging studies, and discussing plan for follow up.  [place] Controlled Substance Database was reviewed by me for overdose risk score (ORS) if appropriate.  This patient was seen by Sallyanne Kuster, FNP-C in collaboration with Dr. Beverely Risen as a part of collaborative care agreement.  Priscella Donna R. Tedd Sias, MSN, FNP-C Internal medicine

## 2023-08-12 NOTE — Telephone Encounter (Signed)
Patient called stating he is still getting fevers in the afternoon this all started 20 some days ago after he got two shots, went to see Dr. Jenne Campus and they gave him Prednisone and the fevers went away, well now since he is done taking that round of Prednisone the fevers are back and only in the afternoon. Pt was instructed to take a covid test and call the office back with results.

## 2023-08-14 DIAGNOSIS — J301 Allergic rhinitis due to pollen: Secondary | ICD-10-CM | POA: Diagnosis not present

## 2023-08-21 DIAGNOSIS — J301 Allergic rhinitis due to pollen: Secondary | ICD-10-CM | POA: Diagnosis not present

## 2023-08-27 ENCOUNTER — Telehealth: Payer: Self-pay

## 2023-08-27 DIAGNOSIS — B9689 Other specified bacterial agents as the cause of diseases classified elsewhere: Secondary | ICD-10-CM

## 2023-08-27 MED ORDER — PREDNISONE 10 MG (21) PO TBPK
ORAL_TABLET | ORAL | 0 refills | Status: AC
Start: 2023-08-27 — End: ?

## 2023-08-27 NOTE — Telephone Encounter (Signed)
Prednisone sent

## 2023-08-27 NOTE — Telephone Encounter (Signed)
Prednisone was sent in and he was notified.

## 2023-08-28 DIAGNOSIS — J301 Allergic rhinitis due to pollen: Secondary | ICD-10-CM | POA: Diagnosis not present

## 2023-09-01 DIAGNOSIS — Z23 Encounter for immunization: Secondary | ICD-10-CM | POA: Diagnosis not present

## 2023-09-04 DIAGNOSIS — J301 Allergic rhinitis due to pollen: Secondary | ICD-10-CM | POA: Diagnosis not present

## 2023-09-11 DIAGNOSIS — J301 Allergic rhinitis due to pollen: Secondary | ICD-10-CM | POA: Diagnosis not present

## 2023-09-18 DIAGNOSIS — J301 Allergic rhinitis due to pollen: Secondary | ICD-10-CM | POA: Diagnosis not present

## 2023-09-24 ENCOUNTER — Encounter: Payer: Self-pay | Admitting: Nurse Practitioner

## 2023-09-25 DIAGNOSIS — J301 Allergic rhinitis due to pollen: Secondary | ICD-10-CM | POA: Diagnosis not present

## 2023-10-02 DIAGNOSIS — J301 Allergic rhinitis due to pollen: Secondary | ICD-10-CM | POA: Diagnosis not present

## 2023-10-09 DIAGNOSIS — J301 Allergic rhinitis due to pollen: Secondary | ICD-10-CM | POA: Diagnosis not present

## 2023-10-15 DIAGNOSIS — J301 Allergic rhinitis due to pollen: Secondary | ICD-10-CM | POA: Diagnosis not present

## 2023-10-16 DIAGNOSIS — J301 Allergic rhinitis due to pollen: Secondary | ICD-10-CM | POA: Diagnosis not present

## 2023-10-23 DIAGNOSIS — J301 Allergic rhinitis due to pollen: Secondary | ICD-10-CM | POA: Diagnosis not present

## 2023-10-30 DIAGNOSIS — J301 Allergic rhinitis due to pollen: Secondary | ICD-10-CM | POA: Diagnosis not present

## 2023-11-06 DIAGNOSIS — F411 Generalized anxiety disorder: Secondary | ICD-10-CM | POA: Diagnosis not present

## 2023-11-06 DIAGNOSIS — F1011 Alcohol abuse, in remission: Secondary | ICD-10-CM | POA: Diagnosis not present

## 2023-11-06 DIAGNOSIS — F39 Unspecified mood [affective] disorder: Secondary | ICD-10-CM | POA: Diagnosis not present

## 2023-11-06 DIAGNOSIS — F410 Panic disorder [episodic paroxysmal anxiety] without agoraphobia: Secondary | ICD-10-CM | POA: Diagnosis not present

## 2023-11-06 DIAGNOSIS — J301 Allergic rhinitis due to pollen: Secondary | ICD-10-CM | POA: Diagnosis not present

## 2023-11-27 DIAGNOSIS — J301 Allergic rhinitis due to pollen: Secondary | ICD-10-CM | POA: Diagnosis not present

## 2023-12-11 DIAGNOSIS — F39 Unspecified mood [affective] disorder: Secondary | ICD-10-CM | POA: Diagnosis not present

## 2023-12-11 DIAGNOSIS — J301 Allergic rhinitis due to pollen: Secondary | ICD-10-CM | POA: Diagnosis not present

## 2023-12-11 DIAGNOSIS — F410 Panic disorder [episodic paroxysmal anxiety] without agoraphobia: Secondary | ICD-10-CM | POA: Diagnosis not present

## 2023-12-11 DIAGNOSIS — F1011 Alcohol abuse, in remission: Secondary | ICD-10-CM | POA: Diagnosis not present

## 2023-12-11 DIAGNOSIS — F411 Generalized anxiety disorder: Secondary | ICD-10-CM | POA: Diagnosis not present

## 2023-12-25 DIAGNOSIS — J301 Allergic rhinitis due to pollen: Secondary | ICD-10-CM | POA: Diagnosis not present

## 2023-12-31 DIAGNOSIS — J301 Allergic rhinitis due to pollen: Secondary | ICD-10-CM | POA: Diagnosis not present

## 2024-01-15 DIAGNOSIS — J301 Allergic rhinitis due to pollen: Secondary | ICD-10-CM | POA: Diagnosis not present

## 2024-01-22 DIAGNOSIS — J301 Allergic rhinitis due to pollen: Secondary | ICD-10-CM | POA: Diagnosis not present

## 2024-02-12 DIAGNOSIS — J301 Allergic rhinitis due to pollen: Secondary | ICD-10-CM | POA: Diagnosis not present

## 2024-02-26 DIAGNOSIS — J301 Allergic rhinitis due to pollen: Secondary | ICD-10-CM | POA: Diagnosis not present

## 2024-03-04 DIAGNOSIS — F1011 Alcohol abuse, in remission: Secondary | ICD-10-CM | POA: Diagnosis not present

## 2024-03-04 DIAGNOSIS — J301 Allergic rhinitis due to pollen: Secondary | ICD-10-CM | POA: Diagnosis not present

## 2024-03-04 DIAGNOSIS — F39 Unspecified mood [affective] disorder: Secondary | ICD-10-CM | POA: Diagnosis not present

## 2024-03-04 DIAGNOSIS — F410 Panic disorder [episodic paroxysmal anxiety] without agoraphobia: Secondary | ICD-10-CM | POA: Diagnosis not present

## 2024-03-04 DIAGNOSIS — F411 Generalized anxiety disorder: Secondary | ICD-10-CM | POA: Diagnosis not present

## 2024-03-11 DIAGNOSIS — J301 Allergic rhinitis due to pollen: Secondary | ICD-10-CM | POA: Diagnosis not present

## 2024-03-18 DIAGNOSIS — J301 Allergic rhinitis due to pollen: Secondary | ICD-10-CM | POA: Diagnosis not present

## 2024-03-24 DIAGNOSIS — J301 Allergic rhinitis due to pollen: Secondary | ICD-10-CM | POA: Diagnosis not present

## 2024-03-25 DIAGNOSIS — J301 Allergic rhinitis due to pollen: Secondary | ICD-10-CM | POA: Diagnosis not present

## 2024-03-31 ENCOUNTER — Telehealth: Payer: Self-pay | Admitting: Cardiovascular Disease

## 2024-03-31 NOTE — Telephone Encounter (Signed)
   Pre-operative Risk Assessment    Patient Name: Jeffery Shaw  DOB: [birth year] MRN: [id number]   Date of last office visit: unknown Date of next office visit: unknown  Request for Surgical Clearance    Procedure:  Dental Extraction - Amount of Teeth to be Pulled:  5  Date of Surgery:  Clearance TBD                                Surgeon:  Dr. Karey Otter [facility] or Practice [facility] Fax number:  [phone]   Type of Clearance Requested:   - Medical    Type of Anesthesia:  Local    Additional requests/questions:  pt has Afib,blood thinner,apinea,asthma  Signed, Geroldine Kotyk   03/31/2024, 11:01 AM

## 2024-04-01 ENCOUNTER — Ambulatory Visit: Attending: Medical | Admitting: Medical

## 2024-04-01 ENCOUNTER — Encounter: Payer: Self-pay | Admitting: Medical

## 2024-04-01 VITALS — BP 123/87 | HR 75 | Ht 73.0 in | Wt 181.0 lb

## 2024-04-01 DIAGNOSIS — I4821 Permanent atrial fibrillation: Secondary | ICD-10-CM | POA: Diagnosis not present

## 2024-04-01 DIAGNOSIS — E785 Hyperlipidemia, unspecified: Secondary | ICD-10-CM | POA: Diagnosis not present

## 2024-04-01 DIAGNOSIS — G4733 Obstructive sleep apnea (adult) (pediatric): Secondary | ICD-10-CM

## 2024-04-01 DIAGNOSIS — Q2112 Patent foramen ovale: Secondary | ICD-10-CM

## 2024-04-01 DIAGNOSIS — I5022 Chronic systolic (congestive) heart failure: Secondary | ICD-10-CM | POA: Diagnosis not present

## 2024-04-01 DIAGNOSIS — J301 Allergic rhinitis due to pollen: Secondary | ICD-10-CM | POA: Diagnosis not present

## 2024-04-01 MED ORDER — APIXABAN 5 MG PO TABS: ORAL_TABLET | ORAL | 3 refills | Status: AC

## 2024-04-01 MED ORDER — LOSARTAN POTASSIUM 25 MG PO TABS: ORAL_TABLET | ORAL | 3 refills | Status: AC

## 2024-04-01 MED ORDER — METOPROLOL TARTRATE 100 MG PO TABS: 100.0000 mg | ORAL_TABLET | Freq: Two times a day (BID) | ORAL | 3 refills | Status: AC

## 2024-04-01 MED ORDER — DIGOXIN 125 MCG PO TABS: 0.1250 mg | ORAL_TABLET | Freq: Every day | ORAL | 3 refills | Status: AC

## 2024-04-01 NOTE — Progress Notes (Signed)
 Cardiology Office Note:  .   Date:  04/01/2024  ID:  Jeffery Shaw, DOB 05/13/1946, MRN [id number] PCP: Laurence Pons, NP  [facility] Providers Cardiologist:  Antionette Kirks, MD     History of Present Illness: .   Jeffery Shaw is a 78 y.o. male with a past medical history of permanent A-fib on Eliquis  with prior unsuccessful cardioversion, HFrEF secondary to nonischemic cardiomyopathy, small PFO, hyperlipidemia, hypothyroidism, OSA on CPAP, GERD who presents for 1 year follow-up.  TEE in 2013 showed EF of 25-30%, normal atrial size along with PFO, small to moderate left atrial shunting.  Right and left heart cath in 2013 showed no significant CAD with QP/QS shunt ratio 1.37.  Most recent echocardiogram from February 2015 showed EF of 40 to 45%.  He has been maintained on rate control strategy given prior unsuccessful cardioversion, even on amiodarone .  Patient was last seen 05/03/2023 and was overall doing well from a cardiac standpoint. Echo and dig level were ordered. Echo was not performed, he has poor follow-up with this.   Today, the patient is overall doing OK. He is trying to uses the CPAP machine. He has not gotten the echo yet. He denies chest pain. He has  SOB , but feels it comes from allergies. He does golf. Diet is OK.    Studies Reviewed: Aaron Aas   EKG Interpretation Date/Time:  Wednesday Apr 01 2024 14:13:23 EDT Ventricular Rate:  75 PR Interval:    QRS Duration:  90 QT Interval:  400 QTC Calculation: 446 R Axis:   -41  Text Interpretation: Atrial fibrillation Left axis deviation Nonspecific ST and T wave abnormality When compared with ECG of 15-Dec-2021 10:03, T wave inversion no longer evident in Lateral leads Confirmed by Gennaro Khat, Antero Derosia ([id number]) on 04/01/2024 2:18:30 PM    TTE 12/2013 - Left ventricle: The cavity size was normal. Wall thickness    was normal. Systolic function was mildly to moderately    reduced. The estimated ejection fraction was in the  range    of 40% to 45%. Wall motion was normal; there were no    regional wall motion abnormalities. The study is not    technically sufficient to allow evaluation of LV diastolic    function.  - Aorta: Aortic root dimension: 40mm (ED).  - Aortic root: The aortic root was mildly dilated.  - Mitral valve: Mild regurgitation.  - Left atrium: The atrium was mildly dilated.  - Right ventricle: The cavity size was mildly dilated. Wall    thickness was normal.  - Right atrium: The atrium was mildly dilated.  - Atrial septum: There was a small secundum atrial septal    defect. There was a small left-to-right shunt through an    atrial septal defect.  - Pulmonary arteries: Systolic pressure was within the    normal range.      Physical Exam:   VS:  BP 123/87 (BP Location: Right Arm)   Pulse 75   Ht 6\' 1"  (1.854 m)   Wt 181 lb (82.1 kg)   SpO2 98%   BMI 23.88 kg/m    Wt Readings from Last 3 Encounters:  04/01/24 181 lb (82.1 kg)  08/12/23 175 lb (79.4 kg)  05/29/23 176 lb (79.8 kg)    GEN: Well nourished, well developed in no acute distress NECK: No JVD; No carotid bruits CARDIAC: Irreg Irreg, no murmurs, rubs, gallops RESPIRATORY:  Clear to auscultation without rales, wheezing or rhonchi  ABDOMEN: Soft,  non-tender, non-distended EXTREMITIES:  No edema; No deformity   ASSESSMENT AND PLAN: .    Permanent Afib EKG shows rate controlled A-fib.  Continue Eliquis  5 mg twice daily for stroke prophylaxis.  Continue metoprolol  to tartrate 100 mg twice daily for rate control.  He is also on digoxin  0.125 mg daily, last level was 0.6.  I will repeated dig level today.  Patient has poor follow-up. I will check CBC, BMET, Mag and TSH today.  Chronic HFrEF Patient is euvolemic on exam.  Most recent LVEF 40%.  Repeat echocardiogram has been ordered, but he has not gotten this done.  He will reschedule echocardiogram.  Continue losartan  25 mg daily and Lopressor  100 mg twice daily.  Small  PFO Repeat echo as above.  OSA on CPAP Patient reports he is compliant with CPAP.  HLD LDL 132. I will update lipid panel today.        Dispo: follow-up in 3 months  Signed, Konstance Happel Rebekah Canada, PA-C

## 2024-04-01 NOTE — Patient Instructions (Signed)
 Medication Instructions:  Your Physician recommend you continue on your current medication as directed.    *If you need a refill on your cardiac medications before your next appointment, please call your pharmacy*  Lab Work: Your provider would like for you to have following labs drawn today CMP, CBC, Lipid, TSH, Mag, Dig level.   If you have labs (blood work) drawn today and your tests are completely normal, you will receive your results only by: MyChart Message (if you have MyChart) OR A paper copy in the mail If you have any lab test that is abnormal or we need to change your treatment, we will call you to review the results.  Testing/Procedures: Your physician has requested that you have an echocardiogram. Echocardiography is a painless test that uses sound waves to create images of your heart. It provides your doctor with information about the size and shape of your heart and how well your heart's chambers and valves are working.   You may receive an ultrasound enhancing agent through an IV if needed to better visualize your heart during the echo. This procedure takes approximately one hour.  There are no restrictions for this procedure.  This will take place at [address] [facility]) [address], [place] [address]  Please note: We ask at that you not bring children with you during ultrasound (echo/ vascular) testing. Due to room size and safety concerns, children are not allowed in the ultrasound rooms during exams. Our front office staff cannot provide observation of children in our lobby area while testing is being conducted. An adult accompanying a patient to their appointment will only be allowed in the ultrasound room at the discretion of the ultrasound technician under special circumstances. We apologize for any inconvenience.   Follow-Up: At [facility], you and your health needs are our priority.  As part of our continuing mission to provide you with  exceptional heart care, our providers are all part of one team.  This team includes your primary Cardiologist (physician) and Advanced Practice Providers or APPs (Physician Assistants and Nurse Practitioners) who all work together to provide you with the care you need, when you need it.  Your next appointment:   3 month(s)  Provider:   Antionette Kirks, MD or Cadence Gennaro Khat, [place]    We recommend signing up for the patient portal called "MyChart".  Sign up information is provided on this After Visit Summary.  MyChart is used to connect with patients for Virtual Visits (Telemedicine).  Patients are able to view lab/test results, encounter notes, upcoming appointments, etc.  Non-urgent messages can be sent to your provider as well.   To learn more about what you can do with MyChart, go to [email or web address].

## 2024-04-01 NOTE — Telephone Encounter (Signed)
 Pt is on Eliquis  and the DDS will recommendations. Pt has been scheduled to see Odessa Bene, FNP 04/08/24 @ 10:55. I will update all parties involved pt has appt.

## 2024-04-01 NOTE — Telephone Encounter (Signed)
   Name: Jeffery Shaw  DOB: September 06, 1946  MRN: [id number]  Primary Cardiologist: Antionette Kirks, MD  Chart reviewed as part of pre-operative protocol coverage. Because of [name] past medical history and time since last visit, he will require a follow-up in-office visit in order to better assess preoperative cardiovascular risk. Pt is overdue for follow-up.   Pre-op covering staff: - Please schedule appointment and call patient to inform them. If patient already had an upcoming appointment within acceptable timeframe, please add "pre-op clearance" to the appointment notes so provider is aware. - Please contact requesting surgeon's office via preferred method (i.e, phone, fax) to inform them of need for appointment prior to surgery.  This message will also be routed to pharmacy pool for input on holding Eliquis  as requested below so that this information is available to the clearing provider at time of patient's appointment.   Jude Norton, NP  04/01/2024, 10:22 AM

## 2024-04-02 ENCOUNTER — Telehealth: Payer: Self-pay | Admitting: Medical

## 2024-04-02 ENCOUNTER — Ambulatory Visit: Payer: Self-pay | Admitting: Medical

## 2024-04-02 DIAGNOSIS — Z79899 Other long term (current) drug therapy: Secondary | ICD-10-CM

## 2024-04-02 LAB — COMPREHENSIVE METABOLIC PANEL WITH GFR
ALT: 20 IU/L (ref 0–44)
AST: 21 IU/L (ref 0–40)
Albumin: 4.1 g/dL (ref 3.8–4.8)
Alkaline Phosphatase: 64 IU/L (ref 44–121)
BUN/Creatinine Ratio: 8 — ABNORMAL LOW (ref 10–24)
BUN: 11 mg/dL (ref 8–27)
Bilirubin Total: 0.8 mg/dL (ref 0.0–1.2)
CO2: 25 mmol/L (ref 20–29)
Calcium: 9 mg/dL (ref 8.6–10.2)
Chloride: 102 mmol/L (ref 96–106)
Creatinine, Ser: 1.36 mg/dL — ABNORMAL HIGH (ref 0.76–1.27)
Globulin, Total: 2.2 g/dL (ref 1.5–4.5)
Glucose: 91 mg/dL (ref 70–99)
Potassium: 4.9 mmol/L (ref 3.5–5.2)
Sodium: 139 mmol/L (ref 134–144)
Total Protein: 6.3 g/dL (ref 6.0–8.5)
eGFR: 54 mL/min/{1.73_m2} — ABNORMAL LOW (ref >59)

## 2024-04-02 LAB — CBC
Hematocrit: 48.7 % (ref 37.5–51.0)
Hemoglobin: 16.4 g/dL (ref 13.0–17.7)
MCH: 33.7 pg — ABNORMAL HIGH (ref 26.6–33.0)
MCHC: 33.7 g/dL (ref 31.5–35.7)
MCV: 100 fL — ABNORMAL HIGH (ref 79–97)
Platelets: 274 10*3/uL (ref 150–450)
RBC: 4.86 x10E6/uL (ref 4.14–5.80)
RDW: 13.3 % (ref 11.6–15.4)
WBC: 6.9 10*3/uL (ref 3.4–10.8)

## 2024-04-02 LAB — LDL CHOLESTEROL, DIRECT: LDL Direct: 134 mg/dL — ABNORMAL HIGH (ref 0–99)

## 2024-04-02 LAB — MAGNESIUM: Magnesium: 2.2 mg/dL (ref 1.6–2.3)

## 2024-04-02 LAB — DIGOXIN LEVEL: Digoxin, Serum: 0.8 ng/mL (ref 0.5–0.9)

## 2024-04-02 LAB — LIPID PANEL
Chol/HDL Ratio: 4.9 ratio (ref 0.0–5.0)
Cholesterol, Total: 204 mg/dL — ABNORMAL HIGH (ref 100–199)
HDL: 42 mg/dL (ref >39)
LDL Chol Calc (NIH): 130 mg/dL — ABNORMAL HIGH (ref 0–99)
Triglycerides: 179 mg/dL — ABNORMAL HIGH (ref 0–149)
VLDL Cholesterol Cal: 32 mg/dL (ref 5–40)

## 2024-04-02 LAB — TSH: TSH: 1.51 u[IU]/mL (ref 0.450–4.500)

## 2024-04-02 NOTE — Telephone Encounter (Signed)
 Contacted patient, he was concerned that he was told yesterday he had a hole in his heart- he states he was unaware of this. After discussion with patient in regards to PFO- patient was noted to have this back in 2013, follow ups were recommended for follow up, ECHO currently scheduled to monitor status of this.   After long discussion- patient had no other questions at this time in regards to PFO. Patient states he was appreciative of the time and length of call.

## 2024-04-02 NOTE — Telephone Encounter (Signed)
 Pt called in stating he would like to speak to nurse or provider about appt yesterday. He said he has some additional concerns on being told her has a hole in his heart. Please advise.

## 2024-04-02 NOTE — Telephone Encounter (Signed)
 Patient with diagnosis of Afib on Eliquis  for anticoagulation.    Procedure: Dental Extraction - Amount of Teeth to be Pulled:  5  Date of procedure: TBD   CHA2DS2-VASc Score = 3   This indicates a 3.2% annual risk of stroke. The patient's score is based upon: CHF History: 1 HTN History: 0 Diabetes History: 0 Stroke History: 0 Vascular Disease History: 0 Age Score: 2 Gender Score: 0     CrCl 52 mL/min Platelet count 274 K  Patient has not had an Afib/aflutter ablation within the last 3 months or DCCV within the last 30 days  Patient does not require pre-op antibiotics for dental procedure.  Per office protocol, patient can hold Eliquis  for 1 days prior to procedure.   Patient will not need bridging with Lovenox (enoxaparin) around procedure.  **This guidance is not considered finalized until pre-operative APP has relayed final recommendations.**

## 2024-04-08 ENCOUNTER — Ambulatory Visit: Admitting: Nurse Practitioner

## 2024-04-08 DIAGNOSIS — J301 Allergic rhinitis due to pollen: Secondary | ICD-10-CM | POA: Diagnosis not present

## 2024-04-09 MED ORDER — ROSUVASTATIN CALCIUM 20 MG PO TABS: 20.0000 mg | ORAL_TABLET | Freq: Every day | ORAL | 3 refills | Status: AC

## 2024-04-09 NOTE — Telephone Encounter (Signed)
 Pt requesting a update.

## 2024-04-09 NOTE — Telephone Encounter (Signed)
 I will forward to preop APP and PAC Cadence Furth who saw the pt; to review if the pt has been cleared.   I do see there is an echo ordered, though not sure if this needed before clearance. I will confirm this with the preop APP and provider who saw the pt.

## 2024-04-10 NOTE — Telephone Encounter (Signed)
 Left message for the pt that he has been cleared and ok to hold Eliquis  x 1 day prior to dental procedure, resume once Dr. Audry Leavell feels it is safe from a bleeding standpoint. Clearance notes have been sent to the dental office.   If any questions please call preop team or the DDS.

## 2024-04-10 NOTE — Telephone Encounter (Signed)
   Patient Name: Jeffery Shaw  DOB: [birth year] MRN: [id number]  Primary Cardiologist: Antionette Kirks, MD  Chart reviewed as part of pre-operative protocol coverage. Given past medical history and time since last visit, based on ACC/AHA guidelines, Jeffery Shaw is at acceptable risk for the planned procedure without further cardiovascular testing.   Per office protocol, patient can hold Eliquis  for 1 days prior to procedure.   Patient will not need bridging with Lovenox (enoxaparin) around procedure.  The patient was advised that if he develops new symptoms prior to surgery to contact our office to arrange for a follow-up visit, and he verbalized understanding.  I will route this recommendation to the requesting party via Epic fax function and remove from pre-op pool.  Please call with questions.  Francene Ing, Retha Cast, NP 04/10/2024, 9:04 AM

## 2024-04-15 DIAGNOSIS — J301 Allergic rhinitis due to pollen: Secondary | ICD-10-CM | POA: Diagnosis not present

## 2024-04-20 ENCOUNTER — Ambulatory Visit: Attending: Medical

## 2024-04-22 ENCOUNTER — Ambulatory Visit: Payer: Medicare Other | Admitting: Nurse Practitioner

## 2024-04-22 DIAGNOSIS — J301 Allergic rhinitis due to pollen: Secondary | ICD-10-CM | POA: Diagnosis not present

## 2024-04-23 DIAGNOSIS — F39 Unspecified mood [affective] disorder: Secondary | ICD-10-CM | POA: Diagnosis not present

## 2024-04-23 DIAGNOSIS — F410 Panic disorder [episodic paroxysmal anxiety] without agoraphobia: Secondary | ICD-10-CM | POA: Diagnosis not present

## 2024-04-23 DIAGNOSIS — F1011 Alcohol abuse, in remission: Secondary | ICD-10-CM | POA: Diagnosis not present

## 2024-04-23 DIAGNOSIS — F411 Generalized anxiety disorder: Secondary | ICD-10-CM | POA: Diagnosis not present

## 2024-04-24 ENCOUNTER — Telehealth: Payer: Self-pay | Admitting: Medical

## 2024-04-24 NOTE — Telephone Encounter (Signed)
 Pt called in stating he has echo 04/20/24 and texted "no" to confirmation. He asked if his no show can be changed. Please advise.

## 2024-04-27 ENCOUNTER — Telehealth: Payer: Self-pay | Admitting: Nurse Practitioner

## 2024-04-27 NOTE — Telephone Encounter (Signed)
 Lvm & sent message to reschedule 04/22/2024 missed appointment-Toni

## 2024-04-29 ENCOUNTER — Telehealth: Payer: Self-pay | Admitting: Nurse Practitioner

## 2024-04-29 DIAGNOSIS — J301 Allergic rhinitis due to pollen: Secondary | ICD-10-CM | POA: Diagnosis not present

## 2024-04-29 NOTE — Telephone Encounter (Signed)
 Left another vm regarding missed appointment-Toni

## 2024-04-30 ENCOUNTER — Telehealth: Payer: Self-pay | Admitting: Nurse Practitioner

## 2024-04-30 ENCOUNTER — Telehealth: Payer: Self-pay | Admitting: Cardiovascular Disease

## 2024-04-30 NOTE — Telephone Encounter (Signed)
 Patient calling to say the dentist office haven't received a clearance from us . Please advise

## 2024-04-30 NOTE — Telephone Encounter (Signed)
 Received dental clearance. Lvm for patient to return call to schedule appointment since he has not been seen since last year-Toni

## 2024-04-30 NOTE — Telephone Encounter (Signed)
 error

## 2024-04-30 NOTE — Telephone Encounter (Signed)
   Pre-operative Risk Assessment    Patient Name: Jeffery Shaw  DOB: 17-Feb-1946 MRN: [id number]   Date of last office visit: 04/01/24 Date of next office visit: 07/02/24  Request for Surgical Clearance    Procedure:  Dental Extraction - Amount of Teeth to be Pulled:  6  Date of Surgery:  Clearance TBD                                Surgeon:  Dr. Karey Otter Group or Practice Name:   [facility] Phone number:  [phone] Fax number:  [phone]   Type of Clearance Requested:   - Pharmacy:  Hold blood thinners      Type of Anesthesia:  Local    Additional requests/questions:    SignedGenny Kid Schools   04/30/2024, 9:51 AM

## 2024-04-30 NOTE — Telephone Encounter (Signed)
 S/W surgeons office [phone]) to confirm the fax number. [phone]) Will route preop clearance to surgeons office.

## 2024-05-01 NOTE — Telephone Encounter (Signed)
 Patient with diagnosis of A Fib on Eliquis  for anticoagulation.    Procedure: Dental Extraction - Amount of Teeth to be Pulled:  6  Date of procedure: TBD   CHA2DS2-VASc Score = 3  This indicates a 3.2% annual risk of stroke. The patient's score is based upon: CHF History: 1 HTN History: 0 Diabetes History: 0 Stroke History: 0 Vascular Disease History: 0 Age Score: 2 Gender Score: 0   CrCl 54 ml/min Platelet count 274K   Patient does not require pre-op antibiotics for dental procedure.  Per office protocol, patient can hold Eliquis  for 1 day prior to procedure.    **This guidance is not considered finalized until pre-operative APP has relayed final recommendations.**

## 2024-05-01 NOTE — Telephone Encounter (Signed)
   Primary Cardiologist: Antionette Kirks, MD  Chart reviewed as part of pre-operative protocol coverage. Given past medical history and time since last visit, based on ACC/AHA guidelines, Jeffery Shaw would be at acceptable risk for the planned procedure without further cardiovascular testing.   Patient should contact our office if he is having new symptoms that are concerning from a cardiac perspective to arrange a follow-up appointment.    Patient does not require pre-op antibiotics for dental procedure. Per office protocol, patient can hold Eliquis  for 1 day prior to procedure.    I will route this recommendation to the requesting party via Epic fax function and remove from pre-op pool.  Please call with questions.  Gerldine Koch, NP-C  05/01/2024, 11:47 AM [address], [address] [place], [place] [address] Office [phone] Fax [phone]

## 2024-05-04 ENCOUNTER — Other Ambulatory Visit: Payer: Self-pay | Admitting: Nurse Practitioner

## 2024-05-04 ENCOUNTER — Encounter: Payer: Self-pay | Admitting: Cardiovascular Disease

## 2024-05-04 ENCOUNTER — Telehealth: Payer: Self-pay | Admitting: Nurse Practitioner

## 2024-05-04 DIAGNOSIS — Z0001 Encounter for general adult medical examination with abnormal findings: Secondary | ICD-10-CM

## 2024-05-04 NOTE — Telephone Encounter (Signed)
 Done sent med

## 2024-05-06 ENCOUNTER — Ambulatory Visit: Admitting: Nurse Practitioner

## 2024-05-14 ENCOUNTER — Telehealth: Payer: Self-pay | Admitting: Nurse Practitioner

## 2024-05-14 NOTE — Telephone Encounter (Signed)
 Left vm to confirm 05/21/24 appointment-Toni

## 2024-05-20 ENCOUNTER — Telehealth: Payer: Self-pay | Admitting: Nurse Practitioner

## 2024-05-20 DIAGNOSIS — J301 Allergic rhinitis due to pollen: Secondary | ICD-10-CM | POA: Diagnosis not present

## 2024-05-20 NOTE — Telephone Encounter (Signed)
 Received surgical clearance from [facility]. Gave to Alyssa-Toni

## 2024-05-21 ENCOUNTER — Ambulatory Visit (INDEPENDENT_AMBULATORY_CARE_PROVIDER_SITE_OTHER): Admitting: Nurse Practitioner

## 2024-05-21 ENCOUNTER — Encounter: Payer: Self-pay | Admitting: Nurse Practitioner

## 2024-05-21 VITALS — BP 130/86 | HR 70 | Temp 98.2°F | Resp 16 | Ht 73.0 in | Wt 180.4 lb

## 2024-05-21 DIAGNOSIS — E039 Hypothyroidism, unspecified: Secondary | ICD-10-CM

## 2024-05-21 DIAGNOSIS — Z1211 Encounter for screening for malignant neoplasm of colon: Secondary | ICD-10-CM

## 2024-05-21 DIAGNOSIS — E559 Vitamin D deficiency, unspecified: Secondary | ICD-10-CM

## 2024-05-21 DIAGNOSIS — I4821 Permanent atrial fibrillation: Secondary | ICD-10-CM

## 2024-05-21 DIAGNOSIS — J301 Allergic rhinitis due to pollen: Secondary | ICD-10-CM

## 2024-05-21 DIAGNOSIS — N289 Disorder of kidney and ureter, unspecified: Secondary | ICD-10-CM | POA: Insufficient documentation

## 2024-05-21 DIAGNOSIS — G2581 Restless legs syndrome: Secondary | ICD-10-CM

## 2024-05-21 DIAGNOSIS — Z0000 Encounter for general adult medical examination without abnormal findings: Secondary | ICD-10-CM

## 2024-05-21 DIAGNOSIS — Z1212 Encounter for screening for malignant neoplasm of rectum: Secondary | ICD-10-CM

## 2024-05-21 DIAGNOSIS — E782 Mixed hyperlipidemia: Secondary | ICD-10-CM

## 2024-05-21 MED ORDER — ROPINIROLE HCL 0.5 MG PO TABS
0.5000 mg | ORAL_TABLET | ORAL | 1 refills | Status: AC | PRN
Start: 2024-05-21 — End: ?

## 2024-05-21 MED ORDER — FLUTICASONE PROPIONATE 50 MCG/ACT NA SUSP: NASAL | 1 refills | Status: AC

## 2024-05-21 MED ORDER — CYCLOBENZAPRINE HCL 10 MG PO TABS: 10.0000 mg | ORAL_TABLET | Freq: Three times a day (TID) | ORAL | 1 refills | Status: AC | PRN

## 2024-05-21 MED ORDER — LEVOTHYROXINE SODIUM 88 MCG PO TABS: ORAL_TABLET | ORAL | 0 refills | Status: DC

## 2024-05-21 NOTE — Progress Notes (Signed)
 [facility] [address] [place], [place] [address]  Internal MEDICINE  Office Visit Note  Patient Name: Jeffery Shaw  [id number]  [id number]  Date of Service: 05/21/2024  Chief Complaint  Patient presents with   Gastroesophageal Reflux   Hyperlipidemia   Medicare Wellness    HPI Jeffery Shaw presents for an annual well visit and physical exam.  Well-appearing 78 y.o. male with restless leg syndrome, AFIB, heart failure, PFO, OSA, hypothyroidism, high cholesterol, decreased kidney function on labs, low vitamin D , allergic rhinitis treated with allergy shots and medications. Routine CRC screening: opt for cologuard  Labs: labs are up to date, done in may by cardiology New or worsening pain: none  Other concerns: none  Sees Dr. Darron and Cadence Franchester RIGGERS for cardiology, they manage his AFIB, heart failure, OSA and hyperlipidemia.  Sees Dr. Herminio for ENT for chronic sinusitis and allergic rhinitis.     05/21/2024    2:35 PM 04/22/2023    2:46 PM 04/03/2022    2:16 PM  MMSE - Mini Mental State Exam  Orientation to time 5 5 5   Orientation to Place 5 5 5   Registration 3 3 3   Attention/ Calculation 5 5 5   Recall 3 3 3   Language- name 2 objects 2 2 2   Language- repeat 1 1 1   Language- follow 3 step command 3 3 3   Language- read & follow direction 1 1 1   Write a sentence 1 1 1   Copy design 1 1 1   Total score 30 30 30     Functional Status Survey: Is the patient deaf or have difficulty hearing?: No Does the patient have difficulty seeing, even when wearing glasses/contacts?: No Does the patient have difficulty concentrating, remembering, or making decisions?: No Does the patient have difficulty walking or climbing stairs?: No Does the patient have difficulty dressing or bathing?: No Does the patient have difficulty doing errands alone such as visiting a doctor's office or shopping?: No     01/24/2021    2:20 PM 08/07/2021    3:06 PM 04/03/2022    2:15 PM 04/22/2023     2:45 PM 05/21/2024    2:34 PM  Fall Risk  Falls in the past year? 0 0 0 0 0  Was there an injury with Fall?    0 0  Fall Risk Category Calculator    0 0  (RETIRED) Patient Fall Risk Level  Low fall risk  Low fall risk     Patient at Risk for Falls Due to No Fall Risks No Fall Risks No Fall Risks No Fall Risks No Fall Risks  Fall risk Follow up Falls evaluation completed  Falls evaluation completed  Falls evaluation completed  Falls evaluation completed Falls evaluation completed     Data saved with a previous flowsheet row definition       05/21/2024    2:34 PM  Depression screen PHQ 2/9  Decreased Interest 0  Down, Depressed, Hopeless 0  PHQ - 2 Score 0        Current Medication: Outpatient Encounter Medications as of 05/21/2024  Medication Sig   albuterol  (VENTOLIN  HFA) 108 (90 Base) MCG/ACT inhaler Inhale 2 puffs into the lungs every 6 (six) hours as needed for wheezing or shortness of breath.   apixaban  (ELIQUIS ) 5 MG TABS tablet TAKE 1 TABLET(5 MG) BY MOUTH TWICE DAILY   busPIRone (BUSPAR) 10 MG tablet Take 10 mg by mouth 2 (two) times daily.   cetirizine (ZYRTEC) 10 MG  tablet Take 10 mg by mouth at bedtime.   cyclobenzaprine  (FLEXERIL ) 10 MG tablet Take 1 tablet (10 mg total) by mouth 3 (three) times daily as needed for muscle spasms.   digoxin  (LANOXIN ) 0.125 MG tablet Take 1 tablet (0.125 mg total) by mouth daily. MUST KEEP 05/03/23 APPOINTMENT PRIOR TO FURTHER REFILLS   eszopiclone (LUNESTA) 1 MG TABS tablet Take 1 mg by mouth at bedtime as needed for sleep. Take immediately before bedtime   fluticasone  (FLONASE ) 50 MCG/ACT nasal spray SHAKE LIQUID AND USE 2 SPRAYS IN EACH NOSTRIL TWICE DAILY   furosemide  (LASIX ) 20 MG tablet Take 1 tablet (20 mg total) by mouth daily as needed.   levothyroxine  (SYNTHROID ) 88 MCG tablet TAKE 1 TABLET(88 MCG) BY MOUTH DAILY   losartan  (COZAAR ) 25 MG tablet TAKE 1 TABLET BY MOUTH EVERY DAY, NEED OFFICE VISIT FOR REFILLS   metoprolol  tartrate  (LOPRESSOR ) 100 MG tablet Take 1 tablet (100 mg total) by mouth 2 (two) times daily.   Oxcarbazepine (TRILEPTAL) 300 MG tablet Take 300 mg by mouth daily.   predniSONE  (STERAPRED UNI-PAK 21 TAB) 10 MG (21) TBPK tablet Use as directed for 6 days (Patient taking differently: as needed (inflammation). Use as directed for 6 days)   rOPINIRole  (REQUIP ) 0.5 MG tablet Take 1 tablet (0.5 mg total) by mouth as needed.   rosuvastatin  (CRESTOR ) 20 MG tablet Take 1 tablet (20 mg total) by mouth daily.   sertraline (ZOLOFT) 50 MG tablet Take 50 mg by mouth daily.   VITAMIN D  PO Take by mouth daily.   [DISCONTINUED] cyclobenzaprine  (FLEXERIL ) 10 MG tablet Take 1 tablet (10 mg total) by mouth 3 (three) times daily as needed for muscle spasms.   [DISCONTINUED] fluticasone  (FLONASE ) 50 MCG/ACT nasal spray SHAKE LIQUID AND USE 2 SPRAYS IN EACH NOSTRIL TWICE DAILY   [DISCONTINUED] levothyroxine  (SYNTHROID ) 88 MCG tablet TAKE 1 TABLET(88 MCG) BY MOUTH DAILY   [DISCONTINUED] rOPINIRole  (REQUIP ) 0.5 MG tablet Take 1 tablet (0.5 mg total) by mouth as needed.   No facility-administered encounter medications on file as of 05/21/2024.    Surgical History: Past Surgical History:  Procedure Laterality Date   CARDIAC CATHETERIZATION  07/2012   [facility]   CARDIOVERSION  09/15/12   CATARACT EXTRACTION W/PHACO Left 04/17/2016   Procedure: CATARACT EXTRACTION PHACO AND INTRAOCULAR LENS PLACEMENT (IOC);  Surgeon: Elsie Carmine, MD;  Location: [facility] ORS;  Service: Ophthalmology;  Laterality: Left;  US  32.9 AP% 20.1 CDE 6.59 FLUID PACK LOT # [id number]   CATARACT EXTRACTION W/PHACO Right 05/21/2016   Procedure: CATARACT EXTRACTION PHACO AND INTRAOCULAR LENS PLACEMENT (IOC);  Surgeon: Elsie Carmine, MD;  Location: [facility] ORS;  Service: Ophthalmology;  Laterality: Right;  US  31.0 AP% 20.4 CDE 6.30 Fluid Pck Lot # [id number]   CYSTOSCOPY W/ URETEROSCOPY     TEE WITHOUT CARDIOVERSION      Medical History: Past Medical History:   Diagnosis Date   Asthma    Cataract    both eyes   Chronic atrial fibrillation (HCC)    a. Rate-controlled; b. CHA2DS2VASc = 2--> Eliquis .   Chronic systolic heart failure (HCC)    a. 07/2012 TEE: EF 30-35%; b. 07/2012 Cath: no significant coronary artery disease with an ejection fraction of 35%. Normal filling pressures with only mild PAH; c. 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 Nl PASP.   Emphysema, unspecified (HCC)    GERD (gastroesophageal reflux disease)  Hyperlipidemia    Hypothyroidism    Mildly dilated aortic root (HCC)    a. 12/2013 Echo: Ao root 40mm.   NICM (nonischemic cardiomyopathy) (HCC)    a. 07/2012 TEE: EF 30-35%; b. 07/2012 Cath: nonobs dzs, EF 35%; c. 12/2013 Echo: EF 40-45%.   Palpitations    Pancreatitis 1980   due to ETOH   Panic attacks    PFO (patent foramen ovale)    a. 2013 TEE & Cath: Tunnelled PFO with a small left-to-right atrial shunt noted on TEE with a QP/QS ratio of 1.37 by cardiac cath; b. 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   Sleep apnea    CPAP    Family History: Family History  Problem Relation Age of Onset   Arrhythmia Sister        A-fib/stroke    Arrhythmia Sister    Hyperlipidemia Brother     Social History   Socioeconomic History   Marital status: Married    Spouse name: Not on file   Number of children: Not on file   Years of education: Not on file   Highest education level: Not on file  Occupational History   Not on file  Tobacco Use   Smoking status: Former    Current packs/day: 0.00    Average packs/day: 0.5 packs/day for 20.0 years (10.0 ttl pk-yrs)    Types: Cigarettes    Start date: 08/07/1988    Quit date: 08/07/2008    Years since quitting: 15.7   Smokeless tobacco: Never  Vaping Use   Vaping status: Never Used  Substance and Sexual Activity   Alcohol use: No   Drug use: No   Sexual activity: Not on file  Other Topics  Concern   Not on file  Social History Narrative   Not on file   Social Drivers of Health   Financial Resource Strain: Not on file  Food Insecurity: Not on file  Transportation Needs: Not on file  Physical Activity: Not on file  Stress: Not on file  Social Connections: Not on file  Intimate Partner Violence: Not on file      Review of Systems  Constitutional:  Negative for activity change, appetite change, chills, fatigue, fever and unexpected weight change.  HENT: Negative.  Negative for congestion, ear pain, rhinorrhea, sore throat and trouble swallowing.   Eyes: Negative.   Respiratory: Negative.  Negative for cough, chest tightness, shortness of breath and wheezing.   Cardiovascular: Negative.  Negative for chest pain.  Gastrointestinal: Negative.  Negative for abdominal pain, blood in stool, constipation, diarrhea, nausea and vomiting.  Endocrine: Negative.   Genitourinary: Negative.  Negative for difficulty urinating, dysuria, frequency, hematuria and urgency.  Musculoskeletal: Negative.  Negative for arthralgias, back pain, joint swelling, myalgias and neck pain.  Skin: Negative.  Negative for rash and wound.  Allergic/Immunologic: Negative.  Negative for immunocompromised state.  Neurological: Negative.  Negative for dizziness, seizures, numbness and headaches.  Hematological: Negative.   Psychiatric/Behavioral: Negative.  Negative for behavioral problems, self-injury and suicidal ideas. The patient is not nervous/anxious.     Vital Signs: BP 130/86   Pulse 70   Temp 98.2 F (36.8 C)   Resp 16   Ht 6' 1 (1.854 m)   Wt 180 lb 6.4 oz (81.8 kg)   SpO2 99%   BMI 23.80 kg/m    Physical Exam Vitals reviewed.  Constitutional:      General: He is awake. He is not in acute distress.  Appearance: Normal appearance. He is well-developed, well-groomed and normal weight. He is not ill-appearing or diaphoretic.  HENT:     Head: Normocephalic and atraumatic.      Right Ear: Tympanic membrane, ear canal and external ear normal.     Left Ear: Tympanic membrane, ear canal and external ear normal.     Nose: Nose normal. No congestion or rhinorrhea.     Mouth/Throat:     Lips: Pink.     Mouth: Mucous membranes are moist.     Pharynx: Oropharynx is clear. Uvula midline. No oropharyngeal exudate or posterior oropharyngeal erythema.  Eyes:     General: Lids are normal. Vision grossly intact. Gaze aligned appropriately. No scleral icterus.       Right eye: No discharge.        Left eye: No discharge.     Extraocular Movements: Extraocular movements intact.     Conjunctiva/sclera: Conjunctivae normal.     Pupils: Pupils are equal, round, and reactive to light.     Funduscopic exam:    Right eye: Red reflex present.        Left eye: Red reflex present. Neck:     Thyroid : No thyromegaly.     Vascular: No JVD.     Trachea: Trachea and phonation normal. No tracheal deviation.  Cardiovascular:     Rate and Rhythm: Normal rate. Rhythm irregular.     Pulses: Normal pulses.     Heart sounds: No murmur heard.    No friction rub. No gallop.  Pulmonary:     Effort: Pulmonary effort is normal. No accessory muscle usage or respiratory distress.     Breath sounds: Normal breath sounds and air entry. No stridor. No decreased breath sounds, wheezing or rales.  Chest:     Chest wall: No tenderness.  Abdominal:     General: Bowel sounds are normal. There is no distension.     Palpations: Abdomen is soft. There is no shifting dullness, fluid wave, mass or pulsatile mass.     Tenderness: There is no abdominal tenderness. There is no guarding or rebound.  Musculoskeletal:        General: No tenderness or deformity. Normal range of motion.     Cervical back: Normal range of motion and neck supple.     Right lower leg: No edema.     Left lower leg: No edema.  Lymphadenopathy:     Cervical: No cervical adenopathy.  Skin:    General: Skin is warm and dry.      Capillary Refill: Capillary refill takes less than 2 seconds.     Coloration: Skin is not pale.     Findings: No erythema or rash.  Neurological:     Mental Status: He is alert and oriented to person, place, and time.     Cranial Nerves: No cranial nerve deficit.     Motor: No abnormal muscle tone.     Coordination: Coordination normal.     Deep Tendon Reflexes: Reflexes are normal and symmetric.  Psychiatric:        Mood and Affect: Mood normal.        Behavior: Behavior normal. Behavior is cooperative.        Thought Content: Thought content normal.        Judgment: Judgment normal.        Assessment/Plan: 1. Encounter for subsequent annual wellness visit (AWV) in Medicare patient (Primary) Age-appropriate preventive screenings and vaccinations discussed. Routine labs for health maintenance up  to date, labs done in may. PHM updated.   - fluticasone  (FLONASE ) 50 MCG/ACT nasal spray; SHAKE LIQUID AND USE 2 SPRAYS IN EACH NOSTRIL TWICE DAILY  Dispense: 96 g; Refill: 1 - cyclobenzaprine  (FLEXERIL ) 10 MG tablet; Take 1 tablet (10 mg total) by mouth 3 (three) times daily as needed for muscle spasms.  Dispense: 30 tablet; Refill: 1  2. Permanent atrial fibrillation (HCC) Managed by cardiology, rate controlled.   3. Acquired hypothyroidism Continue levothyroxine  as prescribed.  - levothyroxine  (SYNTHROID ) 88 MCG tablet; TAKE 1 TABLET(88 MCG) BY MOUTH DAILY  Dispense: 90 tablet; Refill: 0  4. Mixed hyperlipidemia Started on rosuvastatin  by cardiology   5. Seasonal allergic rhinitis due to pollen Continue following up with ENT, allergy immunotherapy injections, PO loratadine and flonase  nasal spray if needed.  - fluticasone  (FLONASE ) 50 MCG/ACT nasal spray; SHAKE LIQUID AND USE 2 SPRAYS IN EACH NOSTRIL TWICE DAILY  Dispense: 96 g; Refill: 1  6. Restless leg syndrome Continue ropinirole  as prescribed. - rOPINIRole  (REQUIP ) 0.5 MG tablet; Take 1 tablet (0.5 mg total) by mouth as  needed.  Dispense: 90 tablet; Refill: 1  7. Vitamin D  deficiency Last vitamin D  level was normal, may continue OTC supplement if desired   8. Screening for colorectal cancer Cologuard test ordered  - Cologuard     General Counseling: kaya klausing understanding of the findings of todays visit and agrees with plan of treatment. I have discussed any further diagnostic evaluation that may be needed or ordered today. We also reviewed his medications today. he has been encouraged to call the office with any questions or concerns that should arise related to todays visit.    Orders Placed This Encounter  Procedures   Cologuard    Meds ordered this encounter  Medications   levothyroxine  (SYNTHROID ) 88 MCG tablet    Sig: TAKE 1 TABLET(88 MCG) BY MOUTH DAILY    Dispense:  90 tablet    Refill:  0   fluticasone  (FLONASE ) 50 MCG/ACT nasal spray    Sig: SHAKE LIQUID AND USE 2 SPRAYS IN EACH NOSTRIL TWICE DAILY    Dispense:  96 g    Refill:  1    **Patient requests 90 days supply**   cyclobenzaprine  (FLEXERIL ) 10 MG tablet    Sig: Take 1 tablet (10 mg total) by mouth 3 (three) times daily as needed for muscle spasms.    Dispense:  30 tablet    Refill:  1   rOPINIRole  (REQUIP ) 0.5 MG tablet    Sig: Take 1 tablet (0.5 mg total) by mouth as needed.    Dispense:  90 tablet    Refill:  1    **Patient requests 90 days supply**    Return in about 1 year (around 05/21/2025) for AWV, Jeffery Shaw PCP and otherwise as needed. .   Total time spent:30 Minutes Time spent includes review of chart, medications, test results, and follow up plan with the patient.   [place] Controlled Substance Database was reviewed by me.  This patient was seen by Jeffery Maxin, FNP-C in collaboration with Dr. Sigrid Bathe as a part of collaborative care agreement.  Jeffery Shaw R. Maxin, MSN, FNP-C Internal medicine

## 2024-05-25 ENCOUNTER — Telehealth: Payer: Self-pay | Admitting: Nurse Practitioner

## 2024-05-25 NOTE — Telephone Encounter (Signed)
 Oral procedure clearance completed. Faxed back to [facility] w/ office notes and medication list; .[phone]. Scanned-Toni

## 2024-06-03 DIAGNOSIS — J301 Allergic rhinitis due to pollen: Secondary | ICD-10-CM | POA: Diagnosis not present

## 2024-06-05 NOTE — Telephone Encounter (Signed)
 Preop clearance notes routed to dental office

## 2024-06-05 NOTE — Telephone Encounter (Signed)
 Dental office requesting office notes, labs and medical clearance to be faxed to [phone]

## 2024-06-10 DIAGNOSIS — J301 Allergic rhinitis due to pollen: Secondary | ICD-10-CM | POA: Diagnosis not present

## 2024-06-10 NOTE — Telephone Encounter (Signed)
 I will re-fax notes to the [facility] office to the fax# given today [phone].

## 2024-06-10 NOTE — Telephone Encounter (Signed)
 Jeffery Shaw is following up. She says they still haven't received office visit notes. She confirmed best fax # to be [phone]. Please do not fax to the other number.

## 2024-06-15 DIAGNOSIS — J301 Allergic rhinitis due to pollen: Secondary | ICD-10-CM | POA: Diagnosis not present

## 2024-06-17 DIAGNOSIS — J301 Allergic rhinitis due to pollen: Secondary | ICD-10-CM | POA: Diagnosis not present

## 2024-07-01 DIAGNOSIS — J301 Allergic rhinitis due to pollen: Secondary | ICD-10-CM | POA: Diagnosis not present

## 2024-07-02 ENCOUNTER — Ambulatory Visit: Attending: Medical | Admitting: Medical

## 2024-07-02 ENCOUNTER — Telehealth: Payer: Self-pay

## 2024-07-02 NOTE — Telephone Encounter (Signed)
 Called patient, LVM advising that echo was not completed, this was the reason for visit today to discuss results. Advised that he needs the testing first.   Left call back number and message to call us  back.

## 2024-07-02 NOTE — Progress Notes (Deleted)
  Cardiology Office Note   Date:  07/02/2024  ID:  Jeffery Shaw, DOB 1946-04-04, MRN [id number] PCP: Liana Fish, NP  [facility] Providers Cardiologist:  Deatrice Cage, MD { Click to update primary MD,subspecialty MD or APP then REFRESH:1}    History of Present Illness Jeffery Shaw is a 78 y.o. male with a past medical history of permanent A-fib on Eliquis  with prior unsuccessful cardioversion, HFrEF secondary to nonischemic cardiomyopathy, small PFO, hyperlipidemia, hypothyroidism, OSA on CPAP, GERD who presents for 1 year follow-up.   TEE in 2013 showed EF of 25-30%, normal atrial size along with PFO, small to moderate left atrial shunting.  Right and left heart cath in 2013 showed no significant CAD with QP/QS shunt ratio 1.37.  Most recent echocardiogram from February 2015 showed EF of 40 to 45%.  He has been maintained on rate control strategy given prior unsuccessful cardioversion, even on amiodarone .  Repeat echo was ordered, but not initially done.  Patient was last seen 04/01/2024 and was overall doing okay.  He was trying to use his CPAP machine.  ROS: ***  Studies Reviewed      *** Risk Assessment/Calculations {Does this patient have ATRIAL FIBRILLATION?[phone]} No BP recorded.  {Refresh Note OR Click here to enter BP  :1}***       Physical Exam VS:  There were no vitals taken for this visit.       Wt Readings from Last 3 Encounters:  05/21/24 180 lb 6.4 oz (81.8 kg)  04/01/24 181 lb (82.1 kg)  08/12/23 175 lb (79.4 kg)    GEN: Well nourished, well developed in no acute distress NECK: No JVD; No carotid bruits CARDIAC: ***RRR, no murmurs, rubs, gallops RESPIRATORY:  Clear to auscultation without rales, wheezing or rhonchi  ABDOMEN: Soft, non-tender, non-distended EXTREMITIES:  No edema; No deformity   ASSESSMENT AND PLAN ***    {Are you ordering a CV Procedure (e.g. stress test, cath, DCCV, TEE, etc)?   Press F2        :[id number]}  Dispo:  ***  Signed, Jarmarcus Wambold VEAR Fishman, PA-C

## 2024-07-15 DIAGNOSIS — J301 Allergic rhinitis due to pollen: Secondary | ICD-10-CM | POA: Diagnosis not present

## 2024-07-22 ENCOUNTER — Encounter: Payer: Self-pay | Admitting: Cardiovascular Disease

## 2024-07-22 DIAGNOSIS — J301 Allergic rhinitis due to pollen: Secondary | ICD-10-CM | POA: Diagnosis not present

## 2024-07-22 NOTE — Telephone Encounter (Signed)
 Jeffery Shaw with Requesting office called in asking if they can have last OV note and most recent labs faxed over for the clearance.   Fax: [phone]

## 2024-07-22 NOTE — Telephone Encounter (Signed)
 Error

## 2024-07-22 NOTE — Telephone Encounter (Signed)
 Will fax over the labs and the Preop clearance to the dental office requested by Olam.   Will fax to [phone] given by Olam

## 2024-07-23 ENCOUNTER — Telehealth: Payer: Self-pay | Admitting: Cardiovascular Disease

## 2024-07-23 NOTE — Telephone Encounter (Signed)
 Notes, labs have been faxed to [facility]

## 2024-07-23 NOTE — Telephone Encounter (Signed)
 Office calling about patients most recent labs and notes in preparation for oral surgery. Please advise

## 2024-07-29 DIAGNOSIS — J301 Allergic rhinitis due to pollen: Secondary | ICD-10-CM | POA: Diagnosis not present

## 2024-08-05 DIAGNOSIS — J301 Allergic rhinitis due to pollen: Secondary | ICD-10-CM | POA: Diagnosis not present

## 2024-08-20 DIAGNOSIS — J301 Allergic rhinitis due to pollen: Secondary | ICD-10-CM | POA: Diagnosis not present

## 2024-08-26 DIAGNOSIS — J301 Allergic rhinitis due to pollen: Secondary | ICD-10-CM | POA: Diagnosis not present

## 2024-09-02 DIAGNOSIS — J301 Allergic rhinitis due to pollen: Secondary | ICD-10-CM | POA: Diagnosis not present

## 2024-09-07 ENCOUNTER — Encounter: Payer: Self-pay | Admitting: Medical

## 2024-09-09 DIAGNOSIS — J301 Allergic rhinitis due to pollen: Secondary | ICD-10-CM | POA: Diagnosis not present

## 2024-09-16 DIAGNOSIS — J301 Allergic rhinitis due to pollen: Secondary | ICD-10-CM | POA: Diagnosis not present

## 2024-11-03 ENCOUNTER — Other Ambulatory Visit: Payer: Self-pay

## 2024-11-03 DIAGNOSIS — E039 Hypothyroidism, unspecified: Secondary | ICD-10-CM

## 2024-11-03 MED ORDER — LEVOTHYROXINE SODIUM 88 MCG PO TABS: ORAL_TABLET | ORAL | 0 refills | Status: AC

## 2025-05-25 ENCOUNTER — Ambulatory Visit: Admitting: Nurse Practitioner
